# Patient Record
Sex: Female | Born: 1995 | Race: Black or African American | Hispanic: No | Marital: Single | Smoking: Never smoker
Health system: Southern US, Community
[De-identification: ages and names within clinical notes are randomized; demographics above are authoritative.]

## PROBLEM LIST (undated history)

## (undated) DIAGNOSIS — D573 Sickle-cell trait: Secondary | ICD-10-CM

## (undated) DIAGNOSIS — S3141XA Laceration without foreign body of vagina and vulva, initial encounter: Secondary | ICD-10-CM

## (undated) HISTORY — DX: Sickle-cell trait: D57.3

## (undated) HISTORY — DX: Laceration without foreign body of vagina and vulva, initial encounter: S31.41XA

## (undated) HISTORY — PX: OTHER SURGICAL HISTORY: SHX169

---
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
## 2014-01-27 ENCOUNTER — Encounter: Payer: Self-pay | Admitting: Pediatrics

## 2014-01-27 ENCOUNTER — Ambulatory Visit (INDEPENDENT_AMBULATORY_CARE_PROVIDER_SITE_OTHER): Payer: Medicaid Other | Admitting: Pediatrics

## 2014-01-27 VITALS — BP 100/74 | Ht 64.0 in | Wt 185.6 lb

## 2014-01-27 DIAGNOSIS — L708 Other acne: Secondary | ICD-10-CM

## 2014-01-27 DIAGNOSIS — N925 Other specified irregular menstruation: Secondary | ICD-10-CM

## 2014-01-27 DIAGNOSIS — N938 Other specified abnormal uterine and vaginal bleeding: Secondary | ICD-10-CM

## 2014-01-27 DIAGNOSIS — Z113 Encounter for screening for infections with a predominantly sexual mode of transmission: Secondary | ICD-10-CM

## 2014-01-27 DIAGNOSIS — L709 Acne, unspecified: Secondary | ICD-10-CM

## 2014-01-27 DIAGNOSIS — N949 Unspecified condition associated with female genital organs and menstrual cycle: Secondary | ICD-10-CM

## 2014-01-27 LAB — POCT HEMOGLOBIN: Hemoglobin: 14.5 g/dL (ref 12.2–16.2)

## 2014-01-27 NOTE — Progress Notes (Signed)
ROS per resident note  I saw and evaluated the patient, performing the key elements of the service.  I developed the management plan that is described in the resident's note, and I agree with the content.

## 2014-01-27 NOTE — Progress Notes (Addendum)
Adolescent Medicine Follow-Up Visit Christy Barnes  is a 18 y.o. female here today for follow-up of irregular vaginal bleeding.   PCP Confirmed?  yes  PERRY, Christy December, MD   History was provided by the patient.  Chart review:  Last seen by Dr. Henrene Barnes on 11/04/2013.  Treatment plan at last visit was continuing Nexplanon and routine screening.    Patient's last menstrual period was 01/20/2014.  Last STI screen: 11/04/2013. Other Labs:11/04/2013 Hgb 15.0, BV and Candida positive, treated with Metroniazole and Fluconazole.   Immunizations: up-to-date   HPI:  Pt reports multiple issues:    1. Irregular vaginal bleeding: Starting October 2014 until January 2015 with medium flow (1-2 pads/tampons a day) tapering to lighter flow at the end. Now since January 2015 having intermittent  bleeding with 3-7 days between episodes.  With the bleeding is having a light flow (1 pad/tampson/day) with menstrual cramps at the beginnning.   Nexplanon placed in June 2014. No history of abnormal bleeding prior to Nexplanon placement.  Denies any dizziness or syncope. Understands that this could be related to the Nexplanon and wants to continue with Nexplanon.  2. STI counseling: Recently started having sexual intercourse with new partner.  Condom use with each encounter according to [facility].  No known STIs. Denies vaginal itching, bleeding, or lesions.  Wants to be safe and is interested in STI testing with new partner.   3. 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 Has been waking her up at nighttime. No medications. Also with sore throat.  History of tactile fever yesterday and rhinorrhea last week, have since resolved. 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   Denies myalgias, wheezing, diarrhea, or vomiting.    ROS as above.   Problem List Reviewed: yes  Medication List Reviewed:  Yes   Sleep: no issues  Nutrition: asking today what vitamins she  should be taking. Reports she generally tries to eat a balanced diet at school but doesn't eat a lot of cow's milk or other dairy products.  School: [employer] at [employer].  Getting  A/B/Cs.    Social History: Confidentiality was discussed with the patient and if applicable, with caregiver as well. Tobacco? no Secondhand smoke exposure? no Drugs/EtOH? no Sexually active? Yes, new partner at school  Safe at home, in school & in relationships? Yes  Last STI Screening: 11/04/13 Pregnancy Prevention: Nexplanon   Physical Exam:  Filed Vitals:   01/27/14 0829  BP: 100/74  Height: 5\' 4"  (1.626 m)  Weight: 185 lb 9.6 oz (84.188 kg)   BP 100/74  Ht 5\' 4"  (1.626 m)  Wt 185 lb 9.6 oz (84.188 kg)  BMI 31.84 kg/m2  LMP 01/20/2014 Body mass index: body mass index is 31.84 kg/(m^2). 80.9% systolic and 98.3% diastolic of BP percentile by age, sex, and height. 128/83 is approximately the 95th BP percentile reading.  GEN: Well appearing, well nourished adolescent female, interactive, in no acute distress. Occasional cough.  HEENT: Normocephalic, atraumatic. PERRLA. Sclera clear. TMs bilaterally clear without fluid or erythema. Oropharynx clear with mild erythema to posterior pharynx, no exudate seen. Moist mucous membranes.   PULM:  Unlabored respirations.  Clear to auscultation bilaterally with no wheezes or crackles.  No accessory muscle use. CARDIO:  Regular rate and rhythm.  No murmurs.  2+ radial pulses GI:  Soft, non tender, non distended.  Normoactive bowel sounds.  No masses.  No hepatosplenomegaly.  EXT: Warm, well perfused.  NEURO: Alert and oriented. Mood and affect normal. No focal deficits.    Results for orders placed in visit on 01/27/14 (from the past 48 hour(s))  POCT HEMOGLOBIN     Status: None   Collection Time    01/27/14  9:10 AM      Result Value Ref Range   Hemoglobin 14.5  12.2 - 16.2 g/dL      Assessment/Plan: Christy Barnes is a 18 year old female presenting with  several medical issues today including vaginal bleeding, cough, STI testings, and nutritional concerns.  Her irregular uterine bleeding is most likely related to recent Nexplanon placement. She has remained asymptomatic with her ongoing blood loss and her hemoglobin is stable.  Other etiologies for irregular uterine bleeding including STIs, which we were planning to obtain given new sexual partner.  Reassured Christy Barnes and discussed possible removal of Nexplanon, however she would prefer to keep in and will follow up if it continues to be problematic. She also presents with a cough and sore throat that is likely viral given well appearance and no localizing findings on exam. No need for rapid Strep test given benign appearance of oropharynx or influenza with lack of fevers or myalgias and unlikely to treat given length of symptoms.           1. Irregular uterine bleeding:  - Continue Nexplanon.  Counseled on reasons to return to clinic.  - Stable hemoglobin with check today.   2. STI counseling: - Urine GC/Chlamydia  - HIV test   3.  URI:  - Supportive care: nasal saline, teaspoon of honey, and gargle salt water.  - Return precautions, and emergency procedures reviewed.   4. Nutrition:  - Recommended OTC Ca/Vit D supplement daily  - No need for multivitamin given balance diet.   Follow up in Barnes 2015 for physical.   Medical decision-making:  - 40 minutes spent, more than 50% of appointment was spent discussing diagnosis and management of symptoms

## 2014-01-27 NOTE — Patient Instructions (Signed)
For your cough and sore throat: - try gargling with salt water,  - using saline spray to help with congestion in nose - 1 teaspoon of honey for the cough  Your bleeding is most likely due to the Nexplanon but we checked your hemoglobin and it was 14.5 which is in the normal range.   You can start taking a Viactiv generic Vit D and Calcium chew since you are not drinking a lot of milk or other dairy products.    Follow up in December 2015, please call for an appointment.

## 2014-01-28 LAB — GC/CHLAMYDIA PROBE AMP, URINE
Chlamydia, Swab/Urine, PCR: NEGATIVE
GC Probe Amp, Urine: NEGATIVE

## 2014-01-28 LAB — HIV ANTIBODY (ROUTINE TESTING W REFLEX): HIV: NONREACTIVE

## 2014-01-30 ENCOUNTER — Telehealth: Payer: Self-pay

## 2014-01-30 NOTE — Telephone Encounter (Signed)
Called and advised patient that her recent labs were all normal.

## 2014-02-25 NOTE — Progress Notes (Signed)
I saw and evaluated the patient, performing the key elements of the service.  I developed the management plan that is described in the resident's note, and I agree with the content. 

## 2014-04-08 ENCOUNTER — Ambulatory Visit (INDEPENDENT_AMBULATORY_CARE_PROVIDER_SITE_OTHER): Payer: Medicaid Other | Admitting: Pediatrics

## 2014-04-08 VITALS — Temp 97.3°F | Wt 185.0 lb

## 2014-04-08 DIAGNOSIS — J309 Allergic rhinitis, unspecified: Secondary | ICD-10-CM

## 2014-04-08 DIAGNOSIS — Z113 Encounter for screening for infections with a predominantly sexual mode of transmission: Secondary | ICD-10-CM

## 2014-04-08 LAB — POCT URINE PREGNANCY: Preg Test, Ur: NEGATIVE

## 2014-04-08 MED ORDER — FLUTICASONE PROPIONATE 50 MCG/ACT NA SUSP: 1.0000 | Freq: Every day | NASAL | Status: DC

## 2014-04-08 MED ORDER — CETIRIZINE HCL 10 MG PO TABS: 10.0000 mg | ORAL_TABLET | Freq: Every day | ORAL | Status: DC

## 2014-04-09 LAB — GC/CHLAMYDIA PROBE AMP, URINE
Chlamydia, Swab/Urine, PCR: NEGATIVE
GC Probe Amp, Urine: NEGATIVE

## 2014-04-09 LAB — HIV ANTIBODY (ROUTINE TESTING W REFLEX): HIV 1&2 Ab, 4th Generation: NONREACTIVE

## 2014-04-09 NOTE — Progress Notes (Signed)
Quick Note:  Leeloo notified by phone of results. ______

## 2014-04-09 NOTE — Progress Notes (Signed)
  Subjective:    Christy Barnes is a 18 y.o. old female here for Cough, Nasal Congestion, Generalized Body Aches and STI screening   HPI  Nasal congestion, post-nasal drip and slight dry cough for 7-10 days after returning home from college. Having some headaches.  No fever, eating and drinking well.   Has a h/o seasonal allergies and is off all allergy medications right now.  Would also like STI screening - has Nexplanon.  Single partner and uses a condom every time but would like STI screening "just to be sure." No symptoms of vaginal discharge or pain.  No dysuria.  Review of Systems  Constitutional: Negative for fever, activity change and appetite change.  HENT: Negative for sinus pressure and sore throat.   Respiratory: Negative for chest tightness and wheezing.   Genitourinary: Negative for dysuria, frequency, vaginal discharge and vaginal pain.    Immunizations needed: none     Objective:    Temp(Src) 97.3 F (36.3 C) (Temporal)  Wt 185 lb (83.915 kg)  LMP 04/04/2014 Physical Exam  Constitutional: She appears well-developed.  HENT:  Right Ear: External ear normal.  Left Ear: External ear normal.  Cobblestoning of poster OP, boggy nasal mucosa  Cardiovascular: Normal rate and regular rhythm.   No murmur heard. Pulmonary/Chest: Breath sounds normal. She has no wheezes.       Assessment and Plan:     Christy Barnes was seen today for Cough, Nasal Congestion, Generalized Body Aches and STI screening  Allergic rhinitis - will rx cetirizine and flonase.  STI screening - see lab orders.  Counseled regarding infection prevention.  Use a condom every time.   Problem List Items Addressed This Visit   None    Visit Diagnoses   Allergic rhinitis    -  Primary    Screening examination for venereal disease        Relevant Orders       GC/chlamydia probe amp, urine (Completed)       POCT urine pregnancy (Completed)       HIV antibody (Completed)       Follow up as needed or if  no improvement in one week.  Royston Cowper, MD

## 2014-05-05 ENCOUNTER — Ambulatory Visit (INDEPENDENT_AMBULATORY_CARE_PROVIDER_SITE_OTHER): Payer: Medicaid Other | Admitting: Pediatrics

## 2014-05-05 ENCOUNTER — Encounter: Payer: Self-pay | Admitting: Pediatrics

## 2014-05-05 VITALS — BP 104/68 | Wt 184.4 lb

## 2014-05-05 DIAGNOSIS — R5381 Other malaise: Secondary | ICD-10-CM

## 2014-05-05 DIAGNOSIS — R1013 Epigastric pain: Secondary | ICD-10-CM

## 2014-05-05 DIAGNOSIS — R5383 Other fatigue: Secondary | ICD-10-CM

## 2014-05-05 LAB — POCT URINALYSIS DIPSTICK
Glucose, UA: NORMAL
Ketones, UA: NEGATIVE
Nitrite, UA: NEGATIVE
Protein, UA: NEGATIVE
Spec Grav, UA: 1.015
Urobilinogen, UA: 4
pH, UA: 7

## 2014-05-05 LAB — CBC WITH DIFFERENTIAL/PLATELET
Basophils Absolute: 0 10*3/uL (ref 0.0–0.1)
Basophils Relative: 1 % (ref 0–1)
Eosinophils Absolute: 0 10*3/uL (ref 0.0–0.7)
Eosinophils Relative: 1 % (ref 0–5)
HCT: 41.2 % (ref 36.0–46.0)
Hemoglobin: 14.2 g/dL (ref 12.0–15.0)
Lymphocytes Relative: 45 % (ref 12–46)
Lymphs Abs: 2 10*3/uL (ref 0.7–4.0)
MCH: 31 pg (ref 26.0–34.0)
MCHC: 34.5 g/dL (ref 30.0–36.0)
MCV: 90 fL (ref 78.0–100.0)
Monocytes Absolute: 0.3 10*3/uL (ref 0.1–1.0)
Monocytes Relative: 6 % (ref 3–12)
Neutro Abs: 2.1 10*3/uL (ref 1.7–7.7)
Neutrophils Relative %: 47 % (ref 43–77)
Platelets: 263 10*3/uL (ref 150–400)
RBC: 4.58 MIL/uL (ref 3.87–5.11)
RDW: 12.6 % (ref 11.5–15.5)
WBC: 4.4 10*3/uL (ref 4.0–10.5)

## 2014-05-05 LAB — POCT HEMOGLOBIN: Hemoglobin: 14.9 g/dL (ref 12.2–16.2)

## 2014-05-05 MED ORDER — OMEPRAZOLE 20 MG PO CPDR: 20.0000 mg | DELAYED_RELEASE_CAPSULE | Freq: Every day | ORAL | Status: DC

## 2014-05-05 NOTE — Progress Notes (Signed)
History was provided by the patient.  HPI:   Christy Barnes is a 18 y.o. female who is here for abdominal pain.  She reports she has never had a pain like this before.  She reports pain started 5 days ago and is achy and feels like a bruise.  She reports pain is epigastric, constant and never goes away.  She says pain ranges from 7-10/10.  She reports she feels dizzy with the pain.  No nausea, vomiting or diarrhea. No fevers.  No changes in appetite. No history of constipation. She denies any alcohol consumption.  No history of frequent NSAID use.  She has a nexplanon in place and last had a period yesterday.  She reports last sexual contact was April.  She denies vaginal discharge or discomfort or pelvic pain.    Pt also notes that she seems very tired lately.  She reports sleeping between 5-8 hours nightly with nighttime awakenings. She does snore at night.  Physical Exam:  BP 104/68  Wt 184 lb 6.4 oz (83.643 kg)  LMP 05/03/2014  No height on file for this encounter. Patient's last menstrual period was 05/03/2014.    General:   alert and no distress     Skin:   normal  Oral cavity:   lips, mucosa, and tongue normal; teeth and gums normal  Eyes:   sclerae white, pupils equal and reactive  Ears:   normal bilaterally  Nose: clear, no discharge  Neck:  Neck appearance: Normal  Lungs:  clear to auscultation bilaterally  Heart:   regular rate and rhythm, S1, S2 normal, no murmur, click, rub or gallop   Abdomen:  + bs, point tenderness on small area in epigastric region, no guarding or rebound, no peritoneal signs  GU:  not examined  Extremities:   extremities normal, atraumatic, no cyanosis or edema  Neuro:  normal without focal findings and mental status, speech normal, alert and oriented x3   POC Hgb 14.9 UA: small LE U preg: neg  Assessment/Plan:  18 yo previously healthy female presents with 5 days of mid epigastric abdominal pain. Non toxic appearing. Eating and drinking  normally. Abdominal exam without peritoneal signs, negative murphy's sign, not indicative of acute abdomen.  Location of pain may be consistent with PUD or reflux.  Unlikely gallbladder or hepatic in nature given clinical presentation but will obtain labs to r/o gallbladder/liver disease.   1. Abd pain - U preg neg - obtain CBC/CMP/Amylase/Lipase and H. Pylori  - will send urine for GC/Chl - start omeprazole 20 mg daily, may continue TUMS  2. Fatigue: - POC Hgb wnl, given history is likely not getting enough sleep - discussed possibility of ENT referral to r/o sleep apnea and pt would like to discuss at next visit.  - Immunizations today: none  - Follow-up visit in 2 days for abdominal pain, or sooner as needed.   Pt given RTC precautions and instructed to go to ER if pain becomes worse or she develops fever.  Christy Barnes. MD PGY-2 [facility] 05/05/2014 5:05 PM     Christy Heck, MD  05/05/2014

## 2014-05-05 NOTE — Patient Instructions (Signed)
Take omeprazole (20 mg) once daily.

## 2014-05-05 NOTE — Progress Notes (Signed)
Patient states she has been having abdominal pain in epigastric area since last Friday. She states that she has been lightheaded on/off since Friday and today it has lasted all day and with pressure.

## 2014-05-05 NOTE — Progress Notes (Signed)
I saw and evaluated the patient.  I participated in the key portions of the service.  I reviewed the resident's note.  I discussed and agree with the resident's findings and plan.    Tavio Biegel, MD   [facility] [address] [place], [place] [address] [phone] 

## 2014-05-06 LAB — COMPREHENSIVE METABOLIC PANEL
ALT: 10 U/L (ref 0–35)
AST: 16 U/L (ref 0–37)
Albumin: 4.3 g/dL (ref 3.5–5.2)
Alkaline Phosphatase: 72 U/L (ref 39–117)
BUN: 8 mg/dL (ref 6–23)
CO2: 25 mEq/L (ref 19–32)
Calcium: 9.6 mg/dL (ref 8.4–10.5)
Chloride: 105 mEq/L (ref 96–112)
Creat: 0.95 mg/dL (ref 0.50–1.10)
Glucose, Bld: 57 mg/dL — ABNORMAL LOW (ref 70–99)
Potassium: 4.3 mEq/L (ref 3.5–5.3)
Sodium: 140 mEq/L (ref 135–145)
Total Bilirubin: 1.3 mg/dL — ABNORMAL HIGH (ref 0.2–1.1)
Total Protein: 7 g/dL (ref 6.0–8.3)

## 2014-05-06 LAB — AMYLASE: Amylase: 47 U/L (ref 0–105)

## 2014-05-06 LAB — GC/CHLAMYDIA PROBE AMP, URINE
Chlamydia, Swab/Urine, PCR: NEGATIVE
GC Probe Amp, Urine: NEGATIVE

## 2014-05-06 LAB — H. PYLORI ANTIBODY, IGG: H Pylori IgG: 3.22 {ISR} — ABNORMAL HIGH

## 2014-05-06 LAB — LIPASE: Lipase: 28 U/L (ref 0–75)

## 2014-05-07 ENCOUNTER — Ambulatory Visit (INDEPENDENT_AMBULATORY_CARE_PROVIDER_SITE_OTHER): Payer: Medicaid Other | Admitting: Pediatrics

## 2014-05-07 ENCOUNTER — Encounter: Payer: Self-pay | Admitting: Pediatrics

## 2014-05-07 VITALS — BP 104/64 | Wt 185.6 lb

## 2014-05-07 DIAGNOSIS — R1013 Epigastric pain: Secondary | ICD-10-CM

## 2014-05-07 DIAGNOSIS — J309 Allergic rhinitis, unspecified: Secondary | ICD-10-CM

## 2014-05-07 MED ORDER — FLUTICASONE PROPIONATE 50 MCG/ACT NA SUSP: 1.0000 | Freq: Every day | NASAL | Status: DC

## 2014-05-07 MED ORDER — CETIRIZINE HCL 10 MG PO TABS: 10.0000 mg | ORAL_TABLET | Freq: Every day | ORAL | Status: DC

## 2014-05-07 NOTE — Patient Instructions (Signed)
Continue taking the omeprazole.  We will send a stool H pylori antigen to see if you have an active H pylori infection.  I will call you with results and if you have it, we will treat you with antibiotics.    Allergic Rhinitis Allergic rhinitis is when the mucous membranes in the nose respond to allergens. Allergens are particles in the air that cause your body to have an allergic reaction. This causes you to release allergic antibodies. Through a chain of events, these eventually cause you to release histamine into the blood stream. Although meant to protect the body, it is this release of histamine that causes your discomfort, such as frequent sneezing, congestion, and an itchy, runny nose.  CAUSES  Seasonal allergic rhinitis (hay fever) is caused by pollen allergens that may come from grasses, trees, and weeds. Year-round allergic rhinitis (perennial allergic rhinitis) is caused by allergens such as house dust mites, pet dander, and mold spores.  SYMPTOMS   Nasal stuffiness (congestion).  Itchy, runny nose with sneezing and tearing of the eyes. DIAGNOSIS  Your health care provider can help you determine the allergen or allergens that trigger your symptoms. If you and your health care provider are unable to determine the allergen, skin or blood testing may be used. TREATMENT  Allergic rhinitis does not have a cure, but it can be controlled by:  Medicines and allergy shots (immunotherapy).  Avoiding the allergen. Hay fever may often be treated with antihistamines in pill or nasal spray forms. Antihistamines block the effects of histamine. There are over-the-counter medicines that may help with nasal congestion and swelling around the eyes. Check with your health care provider before taking or giving this medicine.  If avoiding the allergen or the medicine prescribed do not work, there are many new medicines your health care provider can prescribe. Stronger medicine may be used if initial  measures are ineffective. Desensitizing injections can be used if medicine and avoidance does not work. Desensitization is when a patient is given ongoing shots until the body becomes less sensitive to the allergen. Make sure you follow up with your health care provider if problems continue. HOME CARE INSTRUCTIONS It is not possible to completely avoid allergens, but you can reduce your symptoms by taking steps to limit your exposure to them. It helps to know exactly what you are allergic to so that you can avoid your specific triggers. SEEK MEDICAL CARE IF:   You have a fever.  You develop a cough that does not stop easily (persistent).  You have shortness of breath.  You start wheezing.  Symptoms interfere with normal daily activities. Document Released: 08/01/2001 Document Revised: 11/11/2013 Document Reviewed: 07/14/2013 [facility] Patient Information 2015 [place], [place]. This information is not intended to replace advice given to you by your health care provider. Make sure you discuss any questions you have with your health care provider.

## 2014-05-07 NOTE — Progress Notes (Signed)
  Subjective:    Christy Barnes is a 18 y.o. old female here for Follow-up of abdominal pain. .   Mother present for later part of visit.  HPI    Taking omeprazole and feels that it is helping the pain - pain was 10 now is about 5-6.  Pain is better with eating.  Feels that the pain is worse if she stretches certain ways or with certain sleep positions.   No change in stooling - no dark, tarry stools or constipation.  No problem with menses - no significant cramping or heavy bleeding, although did have some more cramping with this most recent cycle.    Lab results reviewed from 6/16 and discussed with Meleana - H pylori IgG was positive, remainder of labs essentially normal with exception of mildly elevated total bilirubin (1.3).  Still with some daytime fatigue, but sleeps between 6 and 8 hours per night.  Did not realize that she should be sleeping a little more.  She does exercise regularly but hasn't been for the past couple of weeks.  She generally feels more energized when she exercises. Does have mild snoring and some chronic nasal congestion - has previously been rx'ed AR medications but never started them.  Of note, mother is from [place] and [place] has traveled there, but never for more than a few weeks at a time.  Most recent visit was approximately 8 years ago.     Review of Systems  Constitutional: Negative for fever.  Respiratory: Negative for cough and wheezing.   Cardiovascular: Negative for chest pain.  Gastrointestinal: Negative for nausea, vomiting and diarrhea.  Genitourinary: Negative for dysuria.    Immunizations needed: none     Objective:    BP 104/64  Wt 185 lb 10.1 oz (84.201 kg)  LMP 05/03/2014 Physical Exam  Constitutional: She appears well-developed and well-nourished.  HENT:  Mouth/Throat: No oropharyngeal exudate.  Boggy nasal turbinates, mild cobblestoning of posterior OP  Cardiovascular: Normal rate and regular rhythm.   No murmur  heard. Pulmonary/Chest: Effort normal and breath sounds normal.  Abdominal: Soft. Bowel sounds are normal. She exhibits no distension and no mass. There is no tenderness. There is no rebound.  No tenderness to palpation, negative Murphy's sign  Skin: No rash noted.       Assessment and Plan:     Morenike was seen today for Follow-up  Abdominal pain - improved with PPI.  IgG positive for H pylori, but in my review of guidelines, the IgG seems to be fairly sensitive but not very specific and possibly represents past exposure rather than current infection.  Will send stool H pylori antigen to further clarify and treat if stool antigen is positive.  In the meantime healthy diet reviewed and to continue PPI.  Fatigue - discussed healthy sleep habits.  Also encouraged Keilany to exercise as that seems to help her.  Allergic rhinitis - flonase and cetrizine rx sent.   Will phone follow up when we receive stool results. Return precuations reviewed.    Royston Cowper, MD

## 2014-05-08 DIAGNOSIS — J309 Allergic rhinitis, unspecified: Secondary | ICD-10-CM | POA: Insufficient documentation

## 2014-06-29 ENCOUNTER — Ambulatory Visit (INDEPENDENT_AMBULATORY_CARE_PROVIDER_SITE_OTHER): Payer: Medicaid Other | Admitting: Pediatrics

## 2014-06-29 ENCOUNTER — Encounter: Payer: Self-pay | Admitting: Pediatrics

## 2014-06-29 ENCOUNTER — Telehealth: Payer: Self-pay

## 2014-06-29 VITALS — BP 100/70 | Temp 97.0°F | Wt 181.6 lb

## 2014-06-29 DIAGNOSIS — J309 Allergic rhinitis, unspecified: Secondary | ICD-10-CM

## 2014-06-29 DIAGNOSIS — J3089 Other allergic rhinitis: Secondary | ICD-10-CM

## 2014-06-29 DIAGNOSIS — N760 Acute vaginitis: Secondary | ICD-10-CM

## 2014-06-29 DIAGNOSIS — K219 Gastro-esophageal reflux disease without esophagitis: Secondary | ICD-10-CM | POA: Insufficient documentation

## 2014-06-29 DIAGNOSIS — N762 Acute vulvitis: Secondary | ICD-10-CM | POA: Insufficient documentation

## 2014-06-29 MED ORDER — FLUCONAZOLE 100 MG PO TABS: 150.0000 mg | ORAL_TABLET | Freq: Once | ORAL | Status: DC

## 2014-06-29 MED ORDER — OMEPRAZOLE 20 MG PO CPDR: 20.0000 mg | DELAYED_RELEASE_CAPSULE | Freq: Every day | ORAL | Status: DC

## 2014-06-29 NOTE — Progress Notes (Signed)
Vaginal itching x 2-3 weeks, gas cramps have returned since last time that patient was here. Patient is going back to school tomorrow and wanted to be seen and treated before going back. Patient states she needs refills on allergy medications and omeprazole.

## 2014-06-29 NOTE — Progress Notes (Signed)
Subjective:     Patient ID: Christy Barnes, female   DOB: December 04, 1995, 18 y.o.   MRN: [id number]  HPI:  18 year old female who is leaving for college tomorrow and wants refill of some of her meds.  Her allergy meds (Cetirizine and Fluticasone) have refills for a year.  She is out of Omeprazole which she takes for GER and "gassy cramps".    For past several days she has had external vaginal itching without odor or discharge.  LMP was last week.  She has an Implanon and has not changed sex partners.  Last screening for GC/Chlamydia was at her 05/05/14 visit.  She takes showers with Dove soap and uses unscented sanitary pads and sometimes tampons.  Her pubic hair is shaved.   Review of Systems  Constitutional: Negative for fever, activity change and appetite change.  HENT: Negative.   Respiratory: Negative.   Gastrointestinal: Negative.   Genitourinary: Negative for dysuria, frequency, vaginal discharge, difficulty urinating, genital sores and pelvic pain.       Objective:   Physical Exam  Nursing note and vitals reviewed. Constitutional: She appears well-developed and well-nourished.  HENT:  Nose: Nose normal.  Mouth/Throat: Oropharynx is clear and moist.  Neck: Neck supple.  Cardiovascular: Normal rate and normal heart sounds.   No murmur heard. Pulmonary/Chest: Effort normal and breath sounds normal.  Abdominal: Soft. She exhibits no distension. There is no tenderness.  Genitourinary: Vagina normal. No vaginal discharge found.  No redness or swelling of vulvar area.  No external lesions  Lymphadenopathy:    She has no cervical adenopathy.       Assessment:     Vulvitis without discharge- ? yeast AR- under control Hx of GER- needs refil     Plan:     Wet prep obtained.  Pt cell:  [phone]  Rx per orders.   Report worsening sx.   Ander Slade, PPCNP-BC

## 2014-06-29 NOTE — Telephone Encounter (Addendum)
Christy Barnes left VM on refill line asking for her nasal spray and allergy pills (flonase and zyrtec per chart) She uses [facility] on [address]. Checked chart and should have lots of refills from Rx written 6/15. Called pharmacy at [phone] and they do not have any refills listed either. They suggested we re-send electronically. Dr Owens Shark saw patient in June. Will forward notes to PCP also. [place] knows not to try to pick up meds before tomorrow am.

## 2014-06-30 LAB — WET PREP, GENITAL
Trich, Wet Prep: NONE SEEN
Yeast Wet Prep HPF POC: NONE SEEN

## 2014-06-30 MED ORDER — FLUTICASONE PROPIONATE 50 MCG/ACT NA SUSP: 1.0000 | Freq: Every day | NASAL | Status: DC

## 2014-06-30 MED ORDER — CETIRIZINE HCL 10 MG PO TABS: 10.0000 mg | ORAL_TABLET | Freq: Every day | ORAL | Status: DC

## 2014-06-30 NOTE — Telephone Encounter (Signed)
Prescription sent, please notify patient

## 2014-06-30 NOTE — Addendum Note (Signed)
Addended by: Lenore Cordia F on: 06/30/2014 08:39 AM   Modules accepted: Orders

## 2014-07-02 NOTE — Progress Notes (Signed)
Quick Note:  Discussed results with Dr. Henrene Pastor. Patient was seen 06/29/14 with symptom of external vulvar itching in the absence of discharge, odor, lower quadrant pain or dysuria. No treatment required for presence of clue cells without symptoms.   Ander Slade, PPCNP-BC  ______

## 2014-07-14 ENCOUNTER — Telehealth: Payer: Self-pay

## 2014-07-14 DIAGNOSIS — J3089 Other allergic rhinitis: Secondary | ICD-10-CM

## 2014-07-14 NOTE — Telephone Encounter (Signed)
Christy Barnes called stating she needs refills on her Flonase and Cetirizine 10 mg.  She is in college and doing well.  She is not scheduled with [place] currently but did see [place] in 06/2014.

## 2014-07-15 MED ORDER — FLUTICASONE PROPIONATE 50 MCG/ACT NA SUSP: 1.0000 | Freq: Every day | NASAL | Status: DC

## 2014-07-15 MED ORDER — CETIRIZINE HCL 10 MG PO TABS: 10.0000 mg | ORAL_TABLET | Freq: Every day | ORAL | Status: DC

## 2014-07-15 NOTE — Addendum Note (Signed)
Addended by: Lenore Cordia F on: 07/15/2014 11:27 PM   Modules accepted: Orders

## 2014-07-15 NOTE — Telephone Encounter (Signed)
Medications sent to [facility] listed in record for patient.  Please notify her that prescriptions have been sent.

## 2014-07-16 NOTE — Telephone Encounter (Signed)
Called and advised pt her rxs were sent to [facility].  She verbalized understanding.

## 2014-08-05 ENCOUNTER — Telehealth: Payer: Self-pay

## 2014-08-05 NOTE — Telephone Encounter (Signed)
Mom called stating the patient is at college in [place] and is sick.  She is asking that you send medicine to the pharmacy for her.  I advised her she needs to be seen at student health or somewhere there and she did not receive what I told her.

## 2014-10-23 ENCOUNTER — Other Ambulatory Visit: Payer: Self-pay | Admitting: Pediatrics

## 2014-10-23 DIAGNOSIS — L700 Acne vulgaris: Secondary | ICD-10-CM

## 2014-10-23 MED ORDER — CLINDAMYCIN PHOS-BENZOYL PEROX 1-5 % EX GEL: Freq: Two times a day (BID) | CUTANEOUS | Status: DC

## 2014-10-23 NOTE — Telephone Encounter (Signed)
[facility] instead of brand name benzaclin.  Rx sent.

## 2014-10-29 ENCOUNTER — Ambulatory Visit (INDEPENDENT_AMBULATORY_CARE_PROVIDER_SITE_OTHER): Payer: Medicaid Other | Admitting: Pediatrics

## 2014-10-29 ENCOUNTER — Encounter: Payer: Self-pay | Admitting: Pediatrics

## 2014-10-29 DIAGNOSIS — M79644 Pain in right finger(s): Secondary | ICD-10-CM

## 2014-10-29 DIAGNOSIS — L700 Acne vulgaris: Secondary | ICD-10-CM

## 2014-10-29 DIAGNOSIS — Z113 Encounter for screening for infections with a predominantly sexual mode of transmission: Secondary | ICD-10-CM

## 2014-10-29 MED ORDER — CLINDAMYCIN PHOS-BENZOYL PEROX 1-5 % EX GEL: Freq: Two times a day (BID) | CUTANEOUS | Status: DC

## 2014-10-29 NOTE — Progress Notes (Signed)
I reviewed the resident's note and agree with the findings and plan. Maybel Dambrosio, PPCNP-BC  

## 2014-10-29 NOTE — Patient Instructions (Signed)
Continue using hte pain medications that you were prescribed. Also continue icing the thumb at least a few times a day.  There is a splint called a THUMB SPICA SPLIT which is like a wrist guard with a section covering the thumb. You can try to use this to see if it helps, but you should not use it all day.  We will repeat the x-rays today.

## 2014-10-29 NOTE — Progress Notes (Signed)
Subjective:     Patient ID: Christy Barnes, female   DOB: 08-Aug-1996, 18 y.o.   MRN: [id number]  HPI  CC: right thumb pain  # Right thumb pain  1 month ago shut hand in car door, hit thumb, first 3 fingers. Went to health clinic, had x-rays, given diclofenac and hydrocodone-apap. Also icing area. Pain in thumb has persisted.  Medications have not helped much with the pain  Says x-ray did not show fracture of the thumb but said that there was "something" near her hand that should be re-evaluated  Able to move the thumb, occasionally feels swollen and some numbness.   # STD screening  Requests to be tested today  # Acne vulgaris  Says that her refills of benzaclin was denied because it is "no longer covered" by medicaid   Review of Systems No fevers/chills, no nausea/vomiting.   PMH/PSH/SH/FH reviewed and updated as appropriate.    Objective:   Physical Exam LMP 10/15/2014  General: NAD, pleasant 18yo female CV: 2+ radial pulses bilaterally Ext: right thumb not swollen, tender around DP joint, nail bed, and base of thumb; no deformity noted. Thumbnail with black/dried blood under base of nail, about 50% of nail. ROM intact, no difficulty with abduction/adduction, flexion/extension.  Neuro: alert/oriented. grip strength intact, sensation grossly intact to right thumb. Skin: no erythema or swelling of thumb     Assessment:     18 y.o. female with      Plan:     1. Thumb pain, right Discussed time course and pain persisting for >8 weeks. Thumbnail likely to fall off at some point. Continue pain meds as previously prescribed, icing. Also given information on thumb spica splint but told her this likely won't change anything, and primarily to use it at night if she does buy one. Return precautions for signs of infection, frozen joint, neurovascular compromise. Will repeat x-ray to eval for fracture and scaphoid bone (at risk for avascular necrosis/poor healing if affected,  with continued tenderness there) - DG Hand Complete Right; Future  2. Acne vulgaris [facility] as reviewed medicaid 2015 formulary and benzaclin is on this. - clindamycin-benzoyl peroxide (BENZACLIN) gel; Apply topically 2 (two) times daily.  Dispense: 50 g; Refill: 5  3. Screening for STD (sexually transmitted disease) Pt requests STD testing - GC/chlamydia probe amp, urine - HIV antibody (with reflex) - RPR

## 2014-10-29 NOTE — Progress Notes (Signed)
Per pt smashed thumb in door, numbness and painful to move, questions about implanon and testing

## 2014-10-30 LAB — GC/CHLAMYDIA PROBE AMP, URINE
Chlamydia, Swab/Urine, PCR: NEGATIVE
GC Probe Amp, Urine: NEGATIVE

## 2014-10-30 LAB — RPR

## 2014-10-30 LAB — HIV ANTIBODY (ROUTINE TESTING W REFLEX): HIV 1&2 Ab, 4th Generation: NONREACTIVE

## 2015-02-12 ENCOUNTER — Encounter (INDEPENDENT_AMBULATORY_CARE_PROVIDER_SITE_OTHER): Payer: Self-pay

## 2015-02-12 ENCOUNTER — Ambulatory Visit (INDEPENDENT_AMBULATORY_CARE_PROVIDER_SITE_OTHER): Payer: Medicaid Other | Admitting: Pediatrics

## 2015-02-12 VITALS — Temp 98.3°F | Wt 174.8 lb

## 2015-02-12 DIAGNOSIS — Z113 Encounter for screening for infections with a predominantly sexual mode of transmission: Secondary | ICD-10-CM

## 2015-02-12 DIAGNOSIS — K219 Gastro-esophageal reflux disease without esophagitis: Secondary | ICD-10-CM | POA: Diagnosis not present

## 2015-02-12 DIAGNOSIS — J029 Acute pharyngitis, unspecified: Secondary | ICD-10-CM | POA: Diagnosis not present

## 2015-02-12 DIAGNOSIS — Z23 Encounter for immunization: Secondary | ICD-10-CM

## 2015-02-12 DIAGNOSIS — Z3202 Encounter for pregnancy test, result negative: Secondary | ICD-10-CM | POA: Diagnosis not present

## 2015-02-12 DIAGNOSIS — L309 Dermatitis, unspecified: Secondary | ICD-10-CM | POA: Diagnosis not present

## 2015-02-12 LAB — POCT URINE PREGNANCY: Preg Test, Ur: NEGATIVE

## 2015-02-12 MED ORDER — TRIAMCINOLONE ACETONIDE 0.1 % EX OINT: 1.0000 "application " | TOPICAL_OINTMENT | Freq: Two times a day (BID) | CUTANEOUS | Status: DC

## 2015-02-12 MED ORDER — OMEPRAZOLE 20 MG PO CPDR: 20.0000 mg | DELAYED_RELEASE_CAPSULE | Freq: Every day | ORAL | Status: DC

## 2015-02-12 NOTE — Progress Notes (Signed)
  Subjective:    Christy Barnes is a 19 y.o. old female here for abdominal pain and STI screening.    HPI Abdominal pain - comes and goes, seems worse when laying on her stomach.  History of gastritis vs GERD for which she was treated with omeprazole.  She reports that the omeprazole helped and her symptoms had resolved so she stopped using it several weeks ago.    Sore throat - For 1-2 weeks.  No fever.  No change in appetite.  No headache or abdominal pain  Screening for STI -  One female sexual partner.  No new sexual partners since her last STI screening; her partner does not use condoms.  Sites of exposure: oral and vaginal.    Eczema - uses cocoa butter and coconut oil daily.  She reports that her eczema is mostly on her face.  She is almost out of her Triamcinolone 0.1% ointment and she would like a refill.    Review of Systems  No vomiting, no diarrhea, no rash.    History and Problem List: Alyissa has Hair loss; Presence of subdermal contraceptive device; Obesity (BMI 30-39.9); Acne; Allergic rhinitis; Vulvitis; and Gastroesophageal reflux on her problem list.  Skar[place]th  has a past medical history of Overweight and obesity(278.0) and Labial tear (freshman year).     Objective:    Temp(Src) 98.3 F (36.8 C)  Wt 174 lb 12.8 oz (79.289 kg) Physical Exam  Constitutional: She is oriented to person, place, and time. She appears well-developed and well-nourished. No distress.  HENT:  Head: Normocephalic.  Nose: Nose normal.  Mouth/Throat: Oropharyngeal exudate (Purulent tonsillar exudate) present.  Eyes: Conjunctivae are normal. Right eye exhibits no discharge. Left eye exhibits no discharge.  Neck: Normal range of motion. Neck supple.  Cardiovascular: Normal rate, regular rhythm and normal heart sounds.   No murmur heard. Pulmonary/Chest: Effort normal and breath sounds normal.  Abdominal: Soft. There is tenderness (mild epigastric tenderness).  Neurological: She is alert and oriented  to person, place, and time.  Skin: Skin is warm and dry.  Psychiatric: She has a normal mood and affect.  Nursing note and vitals reviewed.      Assessment and Plan:   Kelda is a 19 y.o. old female with:  1. Routine screening for STI (sexually transmitted infection) - GC/chlamydia probe amp, urine - GC/CT Probe, Amp (Throat) - HIV antibody - RPR - HSV(herpes smplx)abs-1+2(IgG+IgM)-bld  2. Need for vaccination Patient counseled regarding vaccination below. - Flu vaccine nasal quad  3. Negative pregnancy test - POCT urine pregnancy  4. Gastroesophageal reflux Restart GERD.  Return precautions reviewed - recommended stool antigen testing for H pylori but patient declined.   - omeprazole (PRILOSEC) 20 MG capsule; Take 1 capsule (20 mg total) by mouth daily.  Dispense: 31 capsule; Refill: 3  5. Exudative pharyngitis Rapid strep negative.  Will send throat culture and GC/ Chlamydia throat.   - GC/CT Probe, Amp (Throat) - POCT rapid strep A - Culture, Group A Strep  6. Eczema Reviewed skin cares including BID moisturizing with bland emollient and hypoallergenic soaps/detergents. - triamcinolone ointment (KENALOG) 0.1 %; Apply 1 application topically 2 (two) times daily. As needed for dry itchy skin  Dispense: 80 g; Refill: 2   Return in about 3 months (around 05/15/2015) for 19 year old [place] with Dr. Owens Shark.  Staley Budzinski, Bascom Levels, MD

## 2015-02-13 LAB — GC/CHLAMYDIA PROBE AMP, URINE
Chlamydia, Swab/Urine, PCR: NEGATIVE
GC Probe Amp, Urine: NEGATIVE

## 2015-02-13 LAB — HIV ANTIBODY (ROUTINE TESTING W REFLEX): HIV 1&2 Ab, 4th Generation: NONREACTIVE

## 2015-02-13 LAB — RPR

## 2015-02-14 LAB — CULTURE, GROUP A STREP: Organism ID, Bacteria: NORMAL

## 2015-02-15 LAB — HSV(HERPES SMPLX)ABS-I+II(IGG+IGM)-BLD
HSV 1 Glycoprotein G Ab, IgG: <0.1 IV
HSV 2 Glycoprotein G Ab, IgG: <0.1 IV
Herpes Simplex Vrs I&II-IgM Ab (EIA): 0.56 INDEX

## 2015-02-15 LAB — GC/CHLAMYDIA PROBE, AMP (THROAT)
Chlamydia trachomatis RNA: NOT DETECTED
Neisseria gonorrhoeae RNA: NOT DETECTED

## 2015-03-29 ENCOUNTER — Ambulatory Visit (INDEPENDENT_AMBULATORY_CARE_PROVIDER_SITE_OTHER): Payer: Medicaid Other | Admitting: Pediatrics

## 2015-03-29 ENCOUNTER — Encounter: Payer: Self-pay | Admitting: Pediatrics

## 2015-03-29 VITALS — Temp 97.4°F | Wt 171.4 lb

## 2015-03-29 DIAGNOSIS — N949 Unspecified condition associated with female genital organs and menstrual cycle: Secondary | ICD-10-CM | POA: Diagnosis not present

## 2015-03-29 DIAGNOSIS — J029 Acute pharyngitis, unspecified: Secondary | ICD-10-CM | POA: Diagnosis not present

## 2015-03-29 DIAGNOSIS — Z113 Encounter for screening for infections with a predominantly sexual mode of transmission: Secondary | ICD-10-CM | POA: Diagnosis not present

## 2015-03-29 LAB — POCT RAPID STREP A (OFFICE): Rapid Strep A Screen: NEGATIVE

## 2015-03-29 NOTE — Patient Instructions (Addendum)
Your rapid strep test was negative but a throat culture has been sent. The final report will return on Wednesday or Thursday and I will call you with the results.   Your current symptoms are compatible with your allergies and I advise you restart the cetirizine and the Flonase nasal spray. You have refills available through your pharmacy. The cetirizine may make you sleepy, so you may wish to take it at bedtime. Remember to rinse your mouth/short gargle after use of the nasal spray and spit it out.  I will also call you with the results of your urine screening tests. I recommend practice protected intercourse with use of condoms. Try sitz baths (tub soak in comfortably warm water) to soothe and genital discomfort. Avoid perfumed soaps and tight fitting garments. Dove soap for sensitive skin is a good choice for cleaning the genital area or use plain water.  Discuss an adult healthcare provider in [facility] Medicine or Internal Medicine with your parents. We will be glad to refer you to [facility], if needed. Keep your appointment with Dr. Owens Shark on 6/26 for this year's physical to avoid any lapse in care while you transition to adult medicine.  Dr. Henrene Pastor will still be able to continue your contraceptive management.
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
## 2015-03-31 ENCOUNTER — Telehealth: Payer: Self-pay | Admitting: *Deleted

## 2015-03-31 LAB — CULTURE, GROUP A STREP: Organism ID, Bacteria: NORMAL

## 2015-03-31 LAB — GC/CHLAMYDIA PROBE AMP, URINE
Chlamydia, Swab/Urine, PCR: NEGATIVE
GC Probe Amp, Urine: NEGATIVE

## 2015-03-31 NOTE — Telephone Encounter (Signed)
Caller requested lab results from last visit.  Called her back and gave left message on her voicemail stating that her results were negative.  Inquired as to how she was feeling and encouraged her to call back with questions or concerns.

## 2015-05-14 ENCOUNTER — Ambulatory Visit: Payer: Medicaid Other | Admitting: Pediatrics

## 2015-06-25 ENCOUNTER — Ambulatory Visit (INDEPENDENT_AMBULATORY_CARE_PROVIDER_SITE_OTHER): Payer: Medicaid Other | Admitting: Pediatrics

## 2015-06-25 ENCOUNTER — Encounter: Payer: Self-pay | Admitting: Pediatrics

## 2015-06-25 VITALS — BP 110/64 | Ht 63.0 in | Wt 177.0 lb

## 2015-06-25 DIAGNOSIS — IMO0002 Reserved for concepts with insufficient information to code with codable children: Secondary | ICD-10-CM

## 2015-06-25 DIAGNOSIS — Z0289 Encounter for other administrative examinations: Secondary | ICD-10-CM

## 2015-06-25 DIAGNOSIS — Z113 Encounter for screening for infections with a predominantly sexual mode of transmission: Secondary | ICD-10-CM | POA: Diagnosis not present

## 2015-06-25 DIAGNOSIS — L700 Acne vulgaris: Secondary | ICD-10-CM

## 2015-06-25 DIAGNOSIS — E668 Other obesity: Secondary | ICD-10-CM | POA: Diagnosis not present

## 2015-06-25 LAB — HEMOGLOBIN A1C
Hgb A1c MFr Bld: 5.3 % (ref <5.7)
Mean Plasma Glucose: 105 mg/dL (ref <117)

## 2015-06-25 MED ORDER — ADAPALENE 0.1 % EX LOTN
1.0000 | TOPICAL_LOTION | Freq: Every day | CUTANEOUS | Status: DC
Start: 2015-06-25 — End: 2015-11-24

## 2015-06-25 MED ORDER — CLINDAMYCIN PHOS-BENZOYL PEROX 1-5 % EX GEL: Freq: Every day | CUTANEOUS | Status: DC

## 2015-06-25 NOTE — Patient Instructions (Addendum)
  Acne Plan  Products: Face Wash:  Use a gentle cleanser, such as Cetaphil (generic version of this is fine) Moisturizer:  Use an "oil-free" moisturizer with SPF Prescription Cream(s):  Benzacline in the morning and Adapalene at bedtime  Morning: Wash face, then completely dry Apply Benzacline, pea size amount that you massage into problem areas on the face. Apply Moisturizer to entire face  Bedtime: Wash face, then completely dry Apply Adapalene, pea size amount that you massage into problem areas on the face.  Remember: - Your acne will probably get worse before it gets better - It takes at least 2 months for the medicines to start working - Use oil free soaps and lotions; these can be over the counter or store-brand - Don't use harsh scrubs or astringents, these can make skin irritation and acne worse - Moisturize daily with oil free lotion because the acne medicines will dry your skin  Call your doctor if you have: - Lots of skin dryness or redness that doesn't get better if you use a moisturizer or if you use the prescription cream or lotion every other day    Stop using the acne medicine immediately and see your doctor if you are or become pregnant or if you think you had an allergic reaction (itchy rash, difficulty breathing, nausea, vomiting) to your acne medication.

## 2015-06-25 NOTE — Progress Notes (Signed)
Routine Well-Adolescent Visit  PCP: No primary care provider on file.  Best phone number for Christy Barnes:    History was provided by the patient.  Christy Barnes is a 19 y.o. female who is here for routine evaluation.   Current concerns: Acne, left foot pain, STI testing.  Christy Barnes is a 19yo F with medical history significant for GERD, acne, and eczema presenting for routine check up today. She has been doing well since her last visit in 03/2015. Patient reports she stopped taking omeprazole about 1 month ago because she does not like taking daily medications. She feels like her GERD is manageable without medication. Christy Barnes is concerned that her acne has been spreading from forehead alone now to perioral face and neck. She is wondering if any dietary changes have caused this to occur. She is compliant with Benzacline BID but is wondering if there is a different medication that could better control her acne. Patient also with left foot pain on the sole of her foot since yesterday morning. Pain is mild and is tight and sharp in character. Finally, Christy Barnes is requesting repeat comprehensive STI testing today.   Adolescent Assessment:  Confidentiality was discussed with the patient and if applicable, with caregiver as well.  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 Work: Summer job at [employer] Activities: visit family members, goes to [employer]  With  parent out of the room and confidentiality discussed: [name] came to appointment alone)  Patient reports being comfortable and safe at school and at home? Yes  Smoking: no Secondhand smoke exposure? no Drugs/EtOH: Occasional social drink   Menstruation:   Menarche: post menarchal, onset 26 or 10 last menses if female: 06/15/2015 Menstrual History: regular every month without intermenstrual spotting and usually lasting 5 to 7 days, moderate cramping, flow is moderate  Sexuality: heterosexual Sexually active? yes   sexual partners in last year:2 contraception use: condoms, nexplanon Last STI Screening: 03/2015  Violence/Abuse: None Mood: Suicidality and Depression: Negative Weapons: None  Screenings: The patient completed the Rapid Assessment for Adolescent Preventive Services screening questionnaire and the following topics were identified as risk factors and discussed: None  In addition, the following topics were discussed as part of anticipatory guidance healthy eating, exercise and condom use.  PHQ-9 completed and results indicated score of 0.  Physical Exam:  BP 110/64 mmHg  Ht 5\' 3"  (1.6 m)  Wt 177 lb (80.287 kg)  BMI 31.36 kg/m2 Facility age limit for growth percentiles is 20 years.  General Appearance:   alert, oriented, no acute distress and obese  HENT: Normocephalic, no obvious abnormality, conjunctiva clear  Mouth:   Normal appearing teeth, no obvious discoloration, has fillings  Neck:   Supple; thyroid: no enlargement, symmetric, no tenderness/mass/nodules  Lungs:   Clear to auscultation bilaterally, normal work of breathing  Heart:   Regular rate and rhythm, S1 and S2 normal, no murmurs;   Abdomen:   Soft, non-tender, no mass, or organomegaly  GU normal female external genitalia, pelvic not performed  Musculoskeletal:  Tone and strength strong and symmetrical, all extremities               Lymphatic:   No cervical adenopathy  Skin/Hair/Nails:   Skin warm,  dry and intact, no rashes, no bruises or petechiae, acneiform lesions on forehead and chin, fine eczematous papules on neck (R>L)  Neurologic:   Strength, gait, and coordination normal and age-appropriate    Assessment/Plan:  1. Encounter for other specified administrative purpose   2. Routine screening for STI (sexually transmitted infection) - Discussed consistent condom use with Sussan and provided her with condoms on today's visit. - STI labs as below:  - GC/chlamydia probe amp, urine  - RPR  - HIV antibody  3. Adult BMI > 30 - BMI: is not appropriate for age - Encouraged her to continue exercising as she has been. Discussed the healthy plate and elimination of fastfood and sweet tea from her diet. - labs as below:  - Cholesterol, total  - HDL cholesterol  - Hemoglobin A1c  4. Acne vulgaris - Informed patient that diet is unlikely to be cause of worsening acne. She has been compliant with Benzacline BID. Will change acne regimen to Benzacline QAM and Adapalene QHS for improved control. Informed patient that she may notice increased skin dryness after starting this regimen. Also discussed that it may take 1-2 months for her to notice skin changes.  - clindamycin-benzoyl peroxide (BENZACLIN) gel; Apply topically daily.  Dispense: 50 g; Refill: 5 - Adapalene (DIFFERIN) 0.1 % LOTN; Apply 1 application topically at bedtime.  Dispense: 59 mL; Refill: 6  5. Foot pain - Foot pain as described is not inhibiting her activity and has not been persistent. Informed her that this is most likely muscle strain and pain should slowly improve. If not she can return to clinic for further evaluation.   - Follow-up visit in 1 year for next visit, or sooner as needed.   Verdie Shire, MD

## 2015-06-26 LAB — CHOLESTEROL, TOTAL: Cholesterol: 153 mg/dL (ref 125–170)

## 2015-06-26 LAB — HIV ANTIBODY (ROUTINE TESTING W REFLEX): HIV 1&2 Ab, 4th Generation: NONREACTIVE

## 2015-06-26 LAB — HDL CHOLESTEROL: HDL: 69 mg/dL (ref >=46)

## 2015-06-26 LAB — GC/CHLAMYDIA PROBE AMP, URINE
Chlamydia, Swab/Urine, PCR: NEGATIVE
GC Probe Amp, Urine: NEGATIVE

## 2015-06-26 LAB — RPR

## 2015-06-29 NOTE — Progress Notes (Signed)
I saw and evaluated the patient, performing the key elements of the service. I developed the management plan that is described in the resident's note, and I agree with the content.  Kate Ettefagh, MD  

## 2015-11-01 ENCOUNTER — Encounter: Payer: Self-pay | Admitting: Pediatrics

## 2015-11-01 ENCOUNTER — Ambulatory Visit (INDEPENDENT_AMBULATORY_CARE_PROVIDER_SITE_OTHER): Payer: Medicaid Other | Admitting: Pediatrics

## 2015-11-01 DIAGNOSIS — Z113 Encounter for screening for infections with a predominantly sexual mode of transmission: Secondary | ICD-10-CM | POA: Diagnosis not present

## 2015-11-01 DIAGNOSIS — B9789 Other viral agents as the cause of diseases classified elsewhere: Secondary | ICD-10-CM

## 2015-11-01 DIAGNOSIS — J029 Acute pharyngitis, unspecified: Secondary | ICD-10-CM | POA: Diagnosis not present

## 2015-11-01 DIAGNOSIS — J069 Acute upper respiratory infection, unspecified: Secondary | ICD-10-CM | POA: Diagnosis not present

## 2015-11-01 LAB — POCT RAPID STREP A (OFFICE): Rapid Strep A Screen: NEGATIVE

## 2015-11-01 NOTE — Progress Notes (Signed)
Subjective:    Patient ID: Christy Barnes, female    DOB: [birth year], 19 y.o.   MRN: [id number]  Christy Barnes is a 19 y.o. female presenting on 11/01/2015 for Sore Throat  Patient presents for a same day appointment.  HPI  SORE THROAT - Reports feeling sick for the past week. Had "worst sore throat", associated with subjective fevers feeling "hot and cold" and had nasal and sinus congestion. Now she states she has improved overall, but still has persistent irritated sore throat worst in mornings, then gets better, sometimes worse in evening. Now has developed more productive cough, sneezing, still nasal congestion.  - Tried DayQuil and NyQuil stopped after about 1 week. - No recent illnesses or antibiotic courses. States that her roommate sleeps with "window open" and gets cold at night, thinks this is related to current illness. - Currently in school and was doing online finals last week, did not miss any time - Denies any active fever, sweats, abdominal pain, nausea, vomiting  STD TESTING - Currently sexually active, with same partner, condoms sometimes (not always), also admits to oral sex within few days of onset of symptoms. Denies any known STD exposure. No new partners since last STD testing 06/2015 with Urine GC/Chlamydia (negative) and HIV / RPR non-reactive. - Currently has Nexplanon in Left arm (placed 04/2013), next due switch 04/2016 No LMP recorded. Patient has had an implant. - Denies any vaginal discharge, headache, rash  Past Medical History  Diagnosis Date  . Overweight and obesity(278.0)   . Labial tear freshman year    Social History   Social History  . Marital Status: Single    Spouse Name: N/A  . Number of Children: N/A  . Years of Education: N/A   Occupational History  . Not on file.   Social History Main Topics  . Smoking status: Never Smoker   . Smokeless tobacco: Not on file  . Alcohol Use: No  . Drug Use: No  . Sexual Activity: Not on  file   Other Topics Concern  . Not on file   Social History Narrative    Current Outpatient Prescriptions on File Prior to Visit  Medication Sig  . Adapalene (DIFFERIN) 0.1 % LOTN Apply 1 application topically at bedtime. (Patient not taking: Reported on 11/01/2015)  . clindamycin-benzoyl peroxide (BENZACLIN) gel Apply topically daily. (Patient not taking: Reported on 11/01/2015)  . fluticasone (FLONASE) 50 MCG/ACT nasal spray Place 1 spray into both nostrils daily. 1 spray in each nostril every day (Patient not taking: Reported on 11/01/2015)  . omeprazole (PRILOSEC) 20 MG capsule Take 1 capsule (20 mg total) by mouth daily. (Patient not taking: Reported on 03/29/2015)  . triamcinolone ointment (KENALOG) 0.1 % Apply 1 application topically 2 (two) times daily. As needed for dry itchy skin (Patient not taking: Reported on 11/01/2015)  . [DISCONTINUED] desloratadine (CLARINEX) 5 MG tablet Take 1 tablet (5 mg total) by mouth daily.   Current Facility-Administered Medications on File Prior to Visit  Medication  . etonogestrel (IMPLANON) implant 68 mg    Review of Systems Per HPI unless specifically indicated above     Objective:    Temp(Src) 98.7 F (37.1 C) (Temporal)  Wt 182 lb 3.2 oz (82.645 kg)  Wt Readings from Last 3 Encounters:  11/01/15 182 lb 3.2 oz (82.645 kg)  06/25/15 177 lb (80.287 kg)  03/29/15 171 lb 6.4 oz (77.747 kg)    Physical Exam  Constitutional: She is oriented to person, place,  and time. She appears well-developed and well-nourished. No distress.  Well-appearing, comfortable, cooperative  HENT:  Head: Normocephalic and atraumatic.  Nose: Nose normal.  Mouth/Throat: No oropharyngeal exudate.  Oropharynx with symmetrical mild general erythema and mild enlarged tonsils without exudates. No post-nasal drip obvious, no asymmetry, uvula midline. TM's clear without erythema or bulging.  Eyes: Conjunctivae and EOM are normal. Pupils are equal, round, and reactive  to light. Right eye exhibits no discharge. Left eye exhibits no discharge.  Neck: Normal range of motion. Neck supple. No thyromegaly present.  Cardiovascular: Normal rate, regular rhythm, normal heart sounds and intact distal pulses.   No murmur heard. Pulmonary/Chest: Effort normal and breath sounds normal. No respiratory distress. She has no wheezes. She has no rales.  Lymphadenopathy:    She has no cervical adenopathy (Anterior or Posterior).  Neurological: She is alert and oriented to person, place, and time.  Skin: Skin is warm and dry. No rash noted. She is not diaphoretic.  Nursing note and vitals reviewed.  Results for orders placed or performed in visit on 11/01/15  POCT rapid strep A  Result Value Ref Range   Rapid Strep A Screen Negative Negative      Assessment & Plan:   Problem List Items Addressed This Visit    None    Visit Diagnoses    Acute pharyngitis, unspecified pharyngitis type        Relevant Orders    POCT rapid strep A (Completed)    GC/CT Probe, Amp (Throat)    Viral URI with cough        Screening examination for venereal disease        Relevant Orders    GC/chlamydia probe amp, urine       No orders of the defined types were placed in this encounter.    Consistent with viral syndrome with initial viral pharyngitis then seems improved and now viral URI with post-nasal drainage causing sore throat. Prior subjective fevers, but seems resolved, currently afebrile. Centor Score 1 with Rapid Strep Negative. No significant LAD to suggest mono, but may consider on differential. No focal signs of infection (TM's clear, lungs clear). Given prior history of unprotected sexual intercourse, patient requesting STD testing and admits to oral sex recently, possible gonorrhea pharyngitis (clinically throat with erythema but no exudates, may still be possible, but less likely with no known exposure or new partners).  Plan: 1. Rapid strep negative today 2.  Reassurance, likely viral etiology of pharyngitis / URI, should be self limited within 7-10 days 3. Check Urine GC/Chlamydia per patient request. Also add GC/Chlamydia Throat probe to evaluate gonorrhea pharyngitis 4. Supportive care with Ibuprofen, Mucinex-DM, improve hydration, warm tea with honey, may use prior Zyrtec, Flonase 5. Return criteria given  Future reminder for follow-up June 2017 for Nexplanon replacement.  Follow up plan: Return in about 1 week (around 11/08/2015), or if symptoms worsen or fail to improve.  Nobie Putnam, [place], PGY-3

## 2015-11-01 NOTE — Progress Notes (Signed)
I saw and evaluated the patient, performing the key elements of the service. I developed the management plan that is described in the resident's note, and I agree with the content.   Georgia Duff B                  11/01/2015, 3:57 PM

## 2015-11-01 NOTE — Patient Instructions (Signed)
Thank you for coming in to clinic today.  1. It sounds like you have a Upper Respiratory Virus - this will most likely run it's course in 7 to 10 days. It probably started as a Viral Pharyngitis (sore throat), your Rapid Strep Throat test was NEGATIVE. - We tested your Urine for Gonorrhea/Chlamydia, and also tested Throat Swab for Gonorrhea - Recommend to start taking Ibuprofen / Motrin 400 to 600 mg every 6 to 8 hours as needed for Sore throat for next 3 to 5 days - Start taking Mucinex-DM for cough twice daily, only take maximum of 7 days - Drink plenty of fluids to improve congestion - You may try over the counter Nasal Saline spray (Simply Saline, Ocean Spray) as needed to reduce congestion. - Drink warm herbal tea with honey for sore throat - If you want to resume your Zyrtec this may help congestion, also can resume nasal steroid spray for now if needed  Due for Nexplanon Arm Implant replace June 2017 - please schedule this in the future for removal and replacement  If develops fevers worsening despite medicine, headaches, worsening sore throat, productive cough or vomiting, not able to tolerate food/drink, please call or return for follow-up.  Please schedule a follow-up appointment with your doctor for annual physical in June 2017  If you have any other questions or concerns, please feel free to call the clinic to contact me. You may also schedule an earlier appointment if necessary.  However, if your symptoms get significantly worse, please go to the Emergency Department to seek immediate medical attention.  Nobie Putnam, [place]

## 2015-11-03 LAB — GC/CHLAMYDIA PROBE AMP, URINE
Chlamydia, Swab/Urine, PCR: NOT DETECTED
GC Probe Amp, Urine: NOT DETECTED

## 2015-11-06 LAB — GC/CHLAMYDIA PROBE, AMP (THROAT)
Chlamydia trachomatis RNA: NOT DETECTED
Neisseria gonorrhoeae RNA: NOT DETECTED

## 2015-11-24 ENCOUNTER — Encounter: Payer: Self-pay | Admitting: Pediatrics

## 2015-11-24 ENCOUNTER — Ambulatory Visit (INDEPENDENT_AMBULATORY_CARE_PROVIDER_SITE_OTHER): Payer: Medicaid Other | Admitting: Pediatrics

## 2015-11-24 VITALS — Temp 98.8°F | Wt 185.8 lb

## 2015-11-24 DIAGNOSIS — R040 Epistaxis: Secondary | ICD-10-CM | POA: Insufficient documentation

## 2015-11-24 DIAGNOSIS — A084 Viral intestinal infection, unspecified: Secondary | ICD-10-CM | POA: Diagnosis not present

## 2015-11-24 DIAGNOSIS — R110 Nausea: Secondary | ICD-10-CM | POA: Diagnosis not present

## 2015-11-24 LAB — POCT URINE PREGNANCY: Preg Test, Ur: NEGATIVE

## 2015-11-24 MED ORDER — FLUTICASONE PROPIONATE 50 MCG/ACT NA SUSP: 1.0000 | Freq: Two times a day (BID) | NASAL | Status: DC

## 2015-11-24 NOTE — Progress Notes (Signed)
History was provided by the patient and mother.  Christy Barnes is a 20 y.o. female who is here for 3 days of cough and congestion and 1 day of nausea and diarrhea.  She has to have a bowel movement every 5 minutes and it is watery and green, no bloody.  Diarrhea started last night.  Subjective fever. No traveling outside the country, no eating outside the home.  Mom had similar symptoms before she did. Patient hasn't had a cycle in almost 5 or 6 weeks, she is on Implanon however she has had a cycle every 4 weeks while being on the Implanon.  She also brought up how she had a few prolonged nose bleeds last week.   She states it took 10 to 30 minutes for it to stop.   The following portions of the patient's history were reviewed and updated as appropriate: allergies, current medications, past family history, past medical history, past social history, past surgical history and problem list.  Review of Systems  Constitutional: Negative for fever and weight loss.  HENT: Positive for congestion. Negative for ear discharge, ear pain and sore throat.   Eyes: Negative for pain, discharge and redness.  Respiratory: Positive for cough. Negative for shortness of breath.   Cardiovascular: Negative for chest pain.  Gastrointestinal: Positive for nausea, abdominal pain and diarrhea. Negative for vomiting.  Genitourinary: Negative for frequency and hematuria.  Musculoskeletal: Negative for back pain, falls and neck pain.  Skin: Negative for rash.  Neurological: Negative for speech change, loss of consciousness and weakness.  Endo/Heme/Allergies: Does not bruise/bleed easily.  Psychiatric/Behavioral: The patient does not have insomnia.      Physical Exam:  Temp(Src) 98.8 F (37.1 C) (Temporal)  Wt 185 lb 12.8 oz (84.278 kg) HR: 90  Facility age limit for growth percentiles is 20 years. No LMP recorded. Patient has had an implant.  General:   alert, cooperative, appears stated age and no distress      Skin:   normal, 2 second capillary refill   Oral cavity:   lips, mucosa, and tongue normal; teeth and gums normal  Eyes:   sclerae white, mild allergic shiners bilaterally   Ears:   normal bilaterally  Nose: clear, no discharge, no nasal flaring, no active bleeding but did see healing white tissue   Neck:  Neck appearance: Normal, no lymphadenopathy   Lungs:  clear to auscultation bilaterally  Heart:   regular rate and rhythm, S1, S2 normal, no murmur, click, rub or gallop   Abdomen:  soft, non-tender; slightly hyperactive bowel sounds; no masses,  no organomegaly  GU:  not examined  Extremities:   extremities normal, atraumatic, no cyanosis or edema  Neuro:  normal without focal findings      Assessment/Plan: Patient is having symptoms from a viral gastroenteritis, she is over well hydrated and is tolerating fluids without any vomiting.  Discussed a PO goal and reasons to return to care.  The epistaxis is most likely due to her uncontrolled allergies, wrote a new script for Flonase and told her to follow-up if epistaxis is still a problem.   1. Nausea - POCT urine pregnancy( negative)   2. Viral gastroenteritis Discussed supportive care and gave a handout  - discussed maintenance of good hydration - discussed signs of dehydration - discussed management of fever - discussed expected course of illness - discussed good hand washing and use of hand sanitizer - discussed with parent to report increased symptoms or no improvement  Christy Acocella Mcneil Sober, MD  11/24/2015

## 2015-11-24 NOTE — Patient Instructions (Addendum)
If she can't keep Gatorade down without vomiting then return to care If the symptoms last longer than 7 days return to care If she goes longer than 12 hours without urinating than return to care   [email or web address] and can also email me at [email or web address]

## 2016-01-28 ENCOUNTER — Ambulatory Visit: Payer: Medicaid Other | Admitting: Pediatrics

## 2016-03-28 ENCOUNTER — Encounter: Payer: Self-pay | Admitting: Pediatrics

## 2016-03-28 ENCOUNTER — Ambulatory Visit (INDEPENDENT_AMBULATORY_CARE_PROVIDER_SITE_OTHER): Payer: BLUE CROSS/BLUE SHIELD | Admitting: Pediatrics

## 2016-03-28 VITALS — Temp 97.7°F | Wt 189.2 lb

## 2016-03-28 DIAGNOSIS — J301 Allergic rhinitis due to pollen: Secondary | ICD-10-CM

## 2016-03-28 DIAGNOSIS — Z113 Encounter for screening for infections with a predominantly sexual mode of transmission: Secondary | ICD-10-CM

## 2016-03-28 MED ORDER — CETIRIZINE HCL 10 MG PO TABS
10.0000 mg | ORAL_TABLET | Freq: Every day | ORAL | Status: DC
Start: 2016-03-28 — End: 2016-07-28

## 2016-03-28 NOTE — Progress Notes (Signed)
History was provided by the patient.  Christy Barnes is a 20 y.o. female presents with sore throat for 3 days and is concerned about sexual exposure.    Sore throat: on the right side it hurts more and when she swallows it is worse.  She has also been sneezing and has rhinorrhea but no coughing.  Has been having chills but no fevers.  Has been taking motrin with no change in symptoms.  No vomiting.  Has been using Flonase since March two times a day.    Headache is in the temporal region.   Headache feels like pressure.  Feels it more towards the end of the day. Sleep makes it better.  If the lights stay on for too long or loud noise it makes.   Sexual exposure: Had sex with a friend 2 weeks ago without a condom.  She hasn't had any symptoms but wants to be safe and get checked.      The following portions of the patient's history were reviewed and updated as appropriate: allergies, current medications, past family history, past medical history, past social history, past surgical history and problem list.  Review of Systems  Constitutional: Negative for fever and weight loss.  HENT: Positive for congestion and sore throat. Negative for ear discharge and ear pain.   Eyes: Negative for pain, discharge and redness.  Respiratory: Positive for cough. Negative for shortness of breath.   Cardiovascular: Negative for chest pain.  Gastrointestinal: Negative for vomiting and diarrhea.  Genitourinary: Negative for frequency and hematuria.  Musculoskeletal: Negative for back pain, falls and neck pain.  Skin: Negative for rash.  Neurological: Positive for headaches. Negative for speech change, loss of consciousness and weakness.  Endo/Heme/Allergies: Does not bruise/bleed easily.  Psychiatric/Behavioral: The patient does not have insomnia.      Physical Exam:  Temp(Src) 97.7 F (36.5 C)  Wt 189 lb 3.2 oz (85.821 kg)  Facility age limit for growth percentiles is 20 years.  General:   alert,  cooperative, appears stated age and no distress  Oral cavity:   lips, mucosa, and tongue normal; teeth and gums normal, tonsil slightly enlarged on the right but not erythematous and no petechiae.  No exudate.   Eyes:   sclerae white, allergic shiners   Ears:   normal bilaterally  Nose: clear, no discharge, no nasal flaring  Neck:  Neck appearance: Normal, no masses or tenderness and has normal range of motion   Lungs:  clear to auscultation bilaterally  Heart:   regular rate and rhythm, S1, S2 normal, no murmur, click, rub or gallop   Neuro:  normal without focal findings     Assessment/Plan: 1. Routine screening for STI (sexually transmitted infection) - RPR - HIV antibody - GC/Chlamydia Probe Amp  2. Allergic rhinitis due to pollen, unspecified rhinitis seasonality - cetirizine (ZYRTEC) 10 MG tablet; Take 1 tablet (10 mg total) by mouth daily.  Dispense: 30 tablet; Refill: 2     Cheynne Virden Mcneil Sober, MD  03/28/2016

## 2016-03-29 ENCOUNTER — Encounter: Payer: Self-pay | Admitting: Pediatrics

## 2016-03-29 LAB — RPR

## 2016-03-29 LAB — HIV ANTIBODY (ROUTINE TESTING W REFLEX): HIV 1&2 Ab, 4th Generation: NONREACTIVE

## 2016-03-29 LAB — GC/CHLAMYDIA PROBE AMP
CT Probe RNA: NOT DETECTED
GC Probe RNA: NOT DETECTED

## 2016-04-03 ENCOUNTER — Encounter: Payer: Self-pay | Admitting: Family

## 2016-04-03 ENCOUNTER — Ambulatory Visit (INDEPENDENT_AMBULATORY_CARE_PROVIDER_SITE_OTHER): Payer: Medicaid Other | Admitting: Family

## 2016-04-03 VITALS — BP 112/70 | HR 82 | Ht 63.39 in | Wt 188.8 lb

## 2016-04-03 DIAGNOSIS — Z113 Encounter for screening for infections with a predominantly sexual mode of transmission: Secondary | ICD-10-CM

## 2016-04-03 DIAGNOSIS — Z3046 Encounter for surveillance of implantable subdermal contraceptive: Secondary | ICD-10-CM

## 2016-04-03 DIAGNOSIS — Z3049 Encounter for surveillance of other contraceptives: Secondary | ICD-10-CM | POA: Diagnosis not present

## 2016-04-03 DIAGNOSIS — Z30017 Encounter for initial prescription of implantable subdermal contraceptive: Secondary | ICD-10-CM | POA: Diagnosis not present

## 2016-04-03 LAB — POCT RAPID HIV: Rapid HIV, POC: NEGATIVE

## 2016-04-03 NOTE — Progress Notes (Signed)
Patient ID: Christy Barnes, female   DOB: Aug 02, 1996, 20 y.o.   MRN: [id number] Pre-Visit Planning  Christy Barnes  is a 20 y.o. female referred by Sarajane Jews, MD.   Last seen in [facility] on 01/27/14 for irregular uterine bleeding with Nexplanon, URI, negative STI screenings at that [place].   Plan at last visit included continue nexplanon, URI symptom mngt.  Date and Type of Previous Psych Screenings? NA  Clinical Staff Visit Tasks:   - Urine GC/CT due? yes - HIV Screening due?  yes - Psych Screenings Due? NA - prepare for nex removal + reinsertion  Provider Visit Tasks: - contraceptive counseling, provide method, update sexual hx/menstrual patterns - [facility] Involvement? No - Pertinent Labs? No  >2 minutes spent reviewing records and planning for patient's visit.

## 2016-04-03 NOTE — Procedures (Signed)
Nexplanon Insertion  No contraindications for placement.  No liver disease, no unexplained vaginal bleeding, no h/o breast cancer, no h/o blood clots.  Patient's last menstrual period was 03/19/2016.  [facility]: nexplanon in place  Last Unprotected sex:  NA  Risks & benefits of Nexplanon discussed The nexplanon device was purchased and supplied by [facility]. Packaging instructions supplied to patient Consent form signed  The patient denies any allergies to anesthetics or antiseptics.  Procedure: Pt was placed in supine position. The left arm was flexed at the elbow and externally rotated so that her wrist was parallel to her ear The medial epicondyle of the left arm was identified The insertions site was marked 8 cm proximal to the medial epicondyle The insertion site was cleaned with Betadine The area surrounding the insertion site was covered with a sterile drape 1% lidocaine was injected just under the skin at the insertion site extending 4 cm proximally. The sterile preloaded disposable Nexaplanon applicator was removed from the sterile packaging The applicator needle was inserted at a 30 degree angle at 8 cm proximal to the medial epicondyle as marked The applicator was lowered to a horizontal position and advanced just under the skin for the full length of the needle The slider on the applicator was retracted fully while the applicator remained in the same position, then the applicator was removed. The implant was confirmed via palpation as being in position The implant position was demonstrated to the patient Pressure dressing was applied to the patient.  The patient was instructed to removed the pressure dressing in 24 hrs.  The patient was advised to move slowly from a supine to an upright position  The patient denied any concerns or complaints  The patient was instructed to schedule a follow-up appt in 1 month and to call sooner if any concerns.  The patient acknowledged  agreement and understanding of the plan.  Risks & benefits of Nexplanon removal discussed. Consent form signed.  The patient denies any allergies to anesthetics or antiseptics.  Procedure: Pt was placed in supine position. left arm was flexed at the elbow and externally rotated so that her wrist was parallel to her ear, The device was palpated and marked. The site was cleaned with Betadine. The area surrounding the device was covered with a sterile drape. 1% lidocaine was injected just under the device. A scalpel was used to create a small incision. The device was pushed towards the incision. Fibrous tissue surrounding the device was gradually removed from the device. The device was removed and measured to ensure all 4 cm of device was removed. Steri-strips were used to close the incision. Pressure dressing was applied to the patient.  The patient was instructed to removed the pressure dressing in 24 hrs.  The patient was advised to move slowly from a supine to an upright position  The patient denied any concerns or complaints  The patient was instructed to schedule a follow-up appt in 1 month.

## 2016-04-03 NOTE — Patient Instructions (Signed)
Follow-up with in 1 month. Schedule this appointment before you leave clinic today.  Your Nexplanon was removed today and is no longer preventing pregnancy.  If you have sex, remember to use condoms to prevent pregnancy and to prevent sexually transmitted infections.  Leave the outside bandage on for 24 hours.  Leave the smaller bandages on for 3-5 days or until they fall off on their own.  Keep the area clean and dry for 3-5 days.  There is usually bruising or swelling at and around the removal site for a few days to a week after the removal.  If you see redness or pus draining from the removal site, call us immediately.  We would like you to return to the clinic for a follow-up visit in 1 month.  You can call [facility] 24 hours a day with any questions or concerns.  There is always a nurse or doctor available to take your call.  Call 9-1-1 if you have a life-threatening emergency.  For anything else, please call us at [phone] before heading to the ER.   Congratulations on getting your Nexplanon placement!  Below is some important information about Nexplanon.  First remember that Nexplanon does not prevent sexually transmitted infections.  Condoms will help prevent sexually transmitted infections. The Nexplanon starts working 7 days after it was inserted.  There is a risk of getting pregnant if you have unprotected sex in those first 7 days after placement of the Nexplanon.  The Nexplanon lasts for 3 years but can be removed at any time.  You can become pregnant as early as 1 week after removal.  You can have a new Nexplanon put in after the old one is removed if you like.  It is not known whether Nexplanon is as effective in women who are very overweight because the studies did not include many overweight women.  Nexplanon interacts with some medications, including barbiturates, bosentan, carbamazepine, felbamate, griseofulvin, oxcarbazepine, phenytoin, rifampin,  St. John's wort, topiramate, HIV medicines.  Please alert your doctor if you are on any of these medicines.  Always tell other healthcare providers that you have a Nexplanon in your arm.  The Nexplanon was placed just under the skin.  Leave the outside bandage on for 24 hours.  Leave the smaller bandage on for 3-5 days or until it falls off on its own.  Keep the area clean and dry for 3-5 days. There is usually bruising or swelling at the insertion site for a few days to a week after placement.  If you see redness or pus draining from the insertion site, call us immediately.  Keep your user card with the date the implant was placed and the date the implant is to be removed.  The most common side effect is a change in your menstrual bleeding pattern.   This bleeding is generally not harmful to you but can be annoying.  Call or come in to see [place] if you have any concerns about the bleeding or if you have any side effects or questions.    We will call you in 1 week to check in and we would like you to return to the clinic for a follow-up visit in 1 month.  You can call [facility] 24 hours a day with any questions or concerns.  There is always a nurse or doctor available to take your call.  Call 9-1-1 if you have a life-threatening emergency.  For anything  else, please call us at [phone] before heading to the ER.

## 2016-04-03 NOTE — Progress Notes (Signed)
THIS RECORD MAY CONTAIN CONFIDENTIAL INFORMATION THAT SHOULD NOT BE RELEASED WITHOUT REVIEW OF THE SERVICE PROVIDER.  Adolescent Medicine Consultation Follow-Up Visit Christy Barnes  is a 20 y.o. female referred by Sarajane Jews, * here today for follow-up.    Previsit planning completed:  yes  Growth Chart Viewed? no   History was provided by the patient.  PCP Confirmed?  Landry Mellow MD  My Chart Activated?   yes   HPI:    20 yo female presents to have nexplanon removed and reinserted.  Has regular cycles, bleeds about 4-5 days. No BTB.  Denies vaginal discharge changes, pelvic or abdominal pain, or painful intercourse.  Has no further questions.    Patient's last menstrual period was 03/19/2016. No Known Allergies Outpatient Prescriptions Prior to Visit  Medication Sig Dispense Refill  . cetirizine (ZYRTEC) 10 MG tablet Take 1 tablet (10 mg total) by mouth daily. 30 tablet 2  . fluticasone (FLONASE) 50 MCG/ACT nasal spray Place 1 spray into both nostrils 2 (two) times daily. (Patient not taking: Reported on 03/28/2016) 16 g 12  . etonogestrel (IMPLANON) implant 68 mg      No facility-administered medications prior to visit.     Patient Active Problem List   Diagnosis Date Noted  . Epistaxis 11/24/2015  . Vulvitis 06/29/2014  . Gastroesophageal reflux 06/29/2014  . Allergic rhinitis 05/08/2014  . Obesity (BMI 30-39.9) 11/04/2013  . Acne 11/04/2013  . Presence of subdermal contraceptive device 05/04/2013  . Hair loss 04/16/2013    Confidentiality was discussed with the patient and if applicable, with caregiver as well.  Patient's personal or confidential phone number: [phone]  The following portions of the patient's history were reviewed and updated as appropriate: allergies, current medications, past family history, past medical history, past social history, past surgical history and problem list.  Physical Exam:  Filed Vitals:   04/03/16 0849  BP:  112/70  Pulse: 82  Height: 5' 3.39" (1.61 m)  Weight: 188 lb 12.8 oz (85.639 kg)   BP 112/70 mmHg  Pulse 82  Ht 5' 3.39" (1.61 m)  Wt 188 lb 12.8 oz (85.639 kg)  BMI 33.04 kg/m2  LMP 03/19/2016 Body mass index: body mass index is 33.04 kg/(m^2). Facility age limit for growth percentiles is 20 years.  Physical Exam  Constitutional: She appears well-developed and well-nourished.  HENT:  Head: Normocephalic.  Eyes: EOM are normal. Pupils are equal, round, and reactive to light. No scleral icterus.  Neck: Normal range of motion.  Cardiovascular: Normal rate.   Pulmonary/Chest: Effort normal.  Musculoskeletal: Normal range of motion.  Neurological: She is alert.  Skin: Skin is warm and dry. No rash noted.  Implant palpable in L arm, then removed per procedure note. New implant palpable in L arm after insertion per procedure note.   Psychiatric: She has a normal mood and affect.     Assessment/Plan: 1. Nexplanon removal Per procedure note  2. Nexplanon insertion Per procedure note  - Subdermal Etonogestrel Implant Insertion; Standing - Subdermal Etonogestrel Implant Insertion  3. Routine screening for STI (sexually transmitted infection) Per protocol  - POCT Rapid HIV - GC/Chlamydia Probe Amp   Follow-up:  Return in about 4 weeks (around 05/01/2016) for with Dierdre Harness, FNP-C.   Medical decision-making:  > 25 minutes spent, more than 50% of appointment was spent discussing diagnosis and management of symptoms

## 2016-04-04 LAB — GC/CHLAMYDIA PROBE AMP
CT Probe RNA: NOT DETECTED
GC Probe RNA: NOT DETECTED

## 2016-04-14 ENCOUNTER — Encounter: Payer: Self-pay | Admitting: Pediatrics

## 2016-04-15 ENCOUNTER — Ambulatory Visit (INDEPENDENT_AMBULATORY_CARE_PROVIDER_SITE_OTHER): Payer: BLUE CROSS/BLUE SHIELD | Admitting: Pediatrics

## 2016-04-15 ENCOUNTER — Encounter: Payer: Self-pay | Admitting: Pediatrics

## 2016-04-15 VITALS — Wt 190.8 lb

## 2016-04-15 DIAGNOSIS — L298 Other pruritus: Secondary | ICD-10-CM

## 2016-04-15 DIAGNOSIS — Z3002 Counseling and instruction in natural family planning to avoid pregnancy: Secondary | ICD-10-CM

## 2016-04-15 DIAGNOSIS — N898 Other specified noninflammatory disorders of vagina: Secondary | ICD-10-CM

## 2016-04-15 LAB — POCT URINALYSIS DIPSTICK
Bilirubin, UA: NEGATIVE
Blood, UA: NEGATIVE
Glucose, UA: NORMAL
Ketones, UA: NEGATIVE
Leukocytes, UA: NEGATIVE
Nitrite, UA: NEGATIVE
Protein, UA: NEGATIVE
Spec Grav, UA: 1.02
Urobilinogen, UA: NEGATIVE
pH, UA: 5

## 2016-04-15 LAB — POCT URINE PREGNANCY: Preg Test, Ur: NEGATIVE

## 2016-04-15 NOTE — Progress Notes (Signed)
    Assessment and Plan:     1. Vaginal itching Hold on medication until wet prep resulted.  [name] is happy to get result with My Chart. - GC/Chlamydia Probe Amp - sent despite negative results a couple weeks ago - POCT urine pregnancy - neg - POCT urinalysis dipstick - no concerns for urine infection - Wet prep, genital   Subjective:  HPI Christy Barnes is a 20 y.o. old female here for Vaginal Itching Uncomfortable since last week in [place] when itching began Now constant awareness of itching and irritation Shaved pubis a few days ago and noticed one nick.  Bled momentarily and has not hurt since.   No creams or douches used No discharge until today noticed a little here with urination No new sexual partners No dysuria, no frequency No fever  Last seen 5/15 and had negative GC/chlamydia  Review of Systems No abdo pain No nausea No back pain  History and Problem List: Christy Barnes has Hair loss; Presence of subdermal contraceptive device; Obesity (BMI 30-39.9); Acne; Allergic rhinitis; Vulvitis; Gastroesophageal reflux; and Epistaxis on her problem list.  Christy Barnes  has a past medical history of Overweight and obesity(278.0) and Labial tear (freshman year).  Objective:   Wt 190 lb 12.8 oz (86.546 kg)  LMP 03/19/2016 Physical Exam  Constitutional: She is oriented to person, place, and time.  Obese   HENT:  Nose: Nose normal.  Mouth/Throat: Oropharynx is clear and moist.  Eyes: Conjunctivae and EOM are normal.  Neck: Neck supple. No thyromegaly present.  Cardiovascular: Normal rate, regular rhythm and normal heart sounds.   Pulmonary/Chest: Effort normal and breath sounds normal.  Abdominal: Soft. Bowel sounds are normal. There is no tenderness.  Genitourinary: Vagina normal. No vaginal discharge found.  Pubis with a few fleshy smooth bumps, no folliculitis.. No vaginal odor.  Neurological: She is alert and oriented to person, place, and time.  Skin: Skin is warm and dry. No  rash noted.  Nursing note and vitals reviewed.   Santiago Glad, MD

## 2016-04-15 NOTE — Patient Instructions (Signed)
Expect the lab result by My Chart before Monday.   You look very healthy today.  Try soaking in warm water with a tablespoon or two of baking soda added to the soak water.  Do not use any harsh soaps or vaginal cleansers. The best website for information about children is [employer]end[employer]pl.  All the information is reliable and up-to-date.    Call the main number [phone] before going to the Emergency Department unless it's a true emergency.  For a true emergency, go to the [facility] Emergency Department.  A nurse always answers the main number [phone] and a doctor is always available, even when the clinic is closed.

## 2016-04-18 LAB — GC/CHLAMYDIA PROBE AMP
CT Probe RNA: NOT DETECTED
GC Probe RNA: NOT DETECTED

## 2016-04-19 ENCOUNTER — Encounter: Payer: Self-pay | Admitting: Pediatrics

## 2016-04-19 LAB — WET PREP BY MOLECULAR PROBE
Candida species: POSITIVE — AB
Gardnerella vaginalis: NEGATIVE
Trichomonas vaginosis: NEGATIVE

## 2016-04-19 NOTE — Progress Notes (Signed)
Quick Note:  Yeast infection needs treatment - offering fluconazole 150 mg oral to patient by MyChart. First send this AM not successful. ______

## 2016-04-20 ENCOUNTER — Other Ambulatory Visit: Payer: Self-pay | Admitting: Pediatrics

## 2016-04-20 ENCOUNTER — Encounter: Payer: Self-pay | Admitting: Pediatrics

## 2016-04-20 MED ORDER — FLUCONAZOLE 150 MG PO TABS: 150.0000 mg | ORAL_TABLET | Freq: Every day | ORAL | Status: DC

## 2016-05-01 ENCOUNTER — Encounter: Payer: Self-pay | Admitting: Pediatrics

## 2016-05-02 ENCOUNTER — Encounter: Payer: Self-pay | Admitting: Family

## 2016-05-02 ENCOUNTER — Ambulatory Visit (INDEPENDENT_AMBULATORY_CARE_PROVIDER_SITE_OTHER): Payer: BLUE CROSS/BLUE SHIELD | Admitting: Family

## 2016-05-02 VITALS — BP 112/74 | HR 100 | Ht 63.78 in | Wt 189.0 lb

## 2016-05-02 DIAGNOSIS — N9089 Other specified noninflammatory disorders of vulva and perineum: Secondary | ICD-10-CM | POA: Diagnosis not present

## 2016-05-02 MED ORDER — LIDOCAINE 5 % EX OINT: 1.0000 "application " | TOPICAL_OINTMENT | CUTANEOUS | Status: DC | PRN

## 2016-05-02 NOTE — Progress Notes (Signed)
Patient ID: Christy Barnes, female   DOB: 01-27-1996, 20 y.o.   MRN: [id number] Pre-Visit Planning  Christy Barnes  is a 20 y.o. female referred by Sarajane Jews, MD.   Last seen in [facility] on 04/03/16 for nexplanon replacement.  Plan at last visit included Nexplanon removal and reinsertion. Read patient email re: Labial irritation.   Date and Type of Previous Psych Screenings? NA  Clinical Staff Visit Tasks:   - Urine GC/CT due? No, unless new partner or exposure concern - HIV Screening due?  no - Psych Screenings Due? No - prepare for GYN exam if symptomatic.  Provider Visit Tasks: - Assess Nexplanon, sx per email [facility] Involvement? No - Pertinent Labs? No  >2 minutes spent reviewing records and planning for patient's visit.

## 2016-05-02 NOTE — Patient Instructions (Signed)
We will call you with results in a few days.

## 2016-05-02 NOTE — Progress Notes (Signed)
THIS RECORD MAY CONTAIN CONFIDENTIAL INFORMATION THAT SHOULD NOT BE RELEASED WITHOUT REVIEW OF THE SERVICE PROVIDER.  Adolescent Medicine Consultation Follow-Up Visit Christy Barnes  is a 20 y.o. female referred by Sarajane Jews, * here today for follow-up.    Previsit planning completed:  Yes Patient ID: Christy Barnes, female   DOB: 11-Aug-1996, 20 y.o.   MRN: [id number] Pre-Visit Planning  Christy Barnes  is a 20 y.o. female referred by Sarajane Jews, MD.   Last seen in [facility] on 04/03/16 for nexplanon replacement.  Plan at last visit included Nexplanon removal and reinsertion. Read patient email re: Labial irritation.   Date and Type of Previous Psych Screenings? NA  Clinical Staff Visit Tasks:   - Urine GC/CT due? No, unless new partner or exposure concern - HIV Screening due?  no - Psych Screenings Due? No - prepare for GYN exam if symptomatic.  Provider Visit Tasks: - Assess Nexplanon, sx per email [facility] Involvement? No - Pertinent Labs? No  >2 minutes spent reviewing records and planning for patient's visit.   Growth Chart Viewed? not applicable   History was provided by the patient.  PCP Confirmed?  Landry Mellow  My Chart Activated?   yes   HPI:   Labia majora - shaved about 2 weeks ago, itching and she started scabbing - having some itching around clitoris too. Having some burning around urethral opening and some clitoral. No new partners; last time she was she was tested for everything and all negative.   Patient's last menstrual period was 04/21/2016. No Known Allergies Outpatient Prescriptions Prior to Visit  Medication Sig Dispense Refill  . cetirizine (ZYRTEC) 10 MG tablet Take 1 tablet (10 mg total) by mouth daily. 30 tablet 2  . fluticasone (FLONASE) 50 MCG/ACT nasal spray Place 1 spray into both nostrils 2 (two) times daily. 16 g 12  . oxymetazoline (12 HOUR NASAL SPRAY) 0.05 % nasal spray     . fluconazole  (DIFLUCAN) 150 MG tablet Take 1 tablet (150 mg total) by mouth daily. 1 tablet 0   No facility-administered medications prior to visit.     Patient Active Problem List   Diagnosis Date Noted  . Epistaxis 11/24/2015  . Vulvitis 06/29/2014  . Gastroesophageal reflux 06/29/2014  . Allergic rhinitis 05/08/2014  . Obesity (BMI 30-39.9) 11/04/2013  . Acne 11/04/2013  . Presence of subdermal contraceptive device 05/04/2013  . Hair loss 04/16/2013    Confidentiality was discussed with the patient and if applicable, with caregiver as well.  Patient's personal or confidential phone number: [phone]  The following portions of the patient's history were reviewed and updated as appropriate: allergies, current medications, past family history, past medical history, past social history, past surgical history and problem list.  Physical Exam:  Filed Vitals:   05/02/16 0911  BP: 112/74  Pulse: 100  Height: 5' 3.78" (1.62 m)  Weight: 189 lb (85.73 kg)   BP 112/74 mmHg  Pulse 100  Ht 5' 3.78" (1.62 m)  Wt 189 lb (85.73 kg)  BMI 32.67 kg/m2  LMP 04/21/2016 Body mass index: body mass index is 32.67 kg/(m^2). Facility age limit for growth percentiles is 20 years.  Physical Exam  Genitourinary:    There is tenderness (at both locations - note area 1 and 2 ) and lesion on the left labia. There is no rash on the left labia. No bleeding in the vagina. No vaginal discharge found.  Assessment/Plan: 1. Labial lesion -will test for HSV; lidocaine gel given for discomfort.  -shaving/hygeine precautions given   - lidocaine (XYLOCAINE) 5 % ointment; Apply 1 application topically as needed.  Dispense: 35.44 g; Refill: 0 - Herpes simplex virus culture   Follow-up:  Return if symptoms worsen or fail to improve.   Medical decision-making:  > 15 minutes spent, more than 50% of appointment was spent discussing diagnosis and management of symptoms

## 2016-05-04 LAB — HERPES SIMPLEX VIRUS CULTURE: Organism ID, Bacteria: NOT DETECTED

## 2016-05-05 ENCOUNTER — Telehealth: Payer: Self-pay | Admitting: *Deleted

## 2016-05-05 NOTE — Telephone Encounter (Signed)
-----   Message from Dierdre Harness, NP sent at [phone]  3:56 PM EDT ----- Negative HSV - likely irritation from shaving. Avoid shaving if possible. Advise if other lesions present or any other symptoms.

## 2016-05-05 NOTE — Telephone Encounter (Signed)
TC to pt. Updated that negative HSV - likely irritation from shaving. Pt verbalized understanding.

## 2016-05-12 ENCOUNTER — Encounter: Payer: Self-pay | Admitting: Pediatrics

## 2016-06-20 ENCOUNTER — Encounter: Payer: Self-pay | Admitting: Pediatrics

## 2016-06-20 ENCOUNTER — Other Ambulatory Visit: Payer: Self-pay | Admitting: Pediatrics

## 2016-06-20 ENCOUNTER — Ambulatory Visit (INDEPENDENT_AMBULATORY_CARE_PROVIDER_SITE_OTHER): Payer: BLUE CROSS/BLUE SHIELD | Admitting: Pediatrics

## 2016-06-20 VITALS — Temp 97.6°F | Wt 195.4 lb

## 2016-06-20 DIAGNOSIS — B9789 Other viral agents as the cause of diseases classified elsewhere: Secondary | ICD-10-CM

## 2016-06-20 DIAGNOSIS — J029 Acute pharyngitis, unspecified: Secondary | ICD-10-CM | POA: Diagnosis not present

## 2016-06-20 DIAGNOSIS — Z113 Encounter for screening for infections with a predominantly sexual mode of transmission: Secondary | ICD-10-CM

## 2016-06-20 DIAGNOSIS — J028 Acute pharyngitis due to other specified organisms: Principal | ICD-10-CM

## 2016-06-20 LAB — POCT RAPID HIV: Rapid HIV, POC: NEGATIVE

## 2016-06-20 NOTE — Progress Notes (Signed)
    Subjective:  Christy Barnes is a 20 y.o. female who presents today with a chief complaint of sore throat.   HPI:  Sore Throat Symptoms started 3 days ago with pain in her throat. Overall pain is about the same over the past 3 days. She has not taken any tylenol or ibuprofen. Last night it was hard for her to sleep due to the pain. Has some associated nasal congestion. No cough or sneeze. No sick contacts.  Screening for STD  Patient also wants STD testing today. She has been in a monogamous relationship over the past 4 months. No vaginal discharge. No fevers or chills.  ROS: Per HPI  Objective:  Physical Exam: Temp 97.6 F (36.4 C)   Wt 195 lb 6.4 oz (88.6 kg)   BMI 33.77 kg/m   Gen: NAD, resting comfortably HEENT: O/P mildly erythematous without purulence or drainage. TMs clear bilaterally. No LAD noted.  CV: RRR with no murmurs appreciated Pulm: NWOB, CTAB with no crackles, wheezes, or rhonchi MSK: no edema, cyanosis, or clubbing noted Skin: warm, dry Neuro: grossly normal, moves all extremities Psych: Normal affect and thought content  Assessment/Plan:  Sore Throat Likely viral URI. No signs of bacterial infection. Centor score of 1. Treat symptomatically with tylenol and/or ibuprofen. Return precautions reviewed. Instructed patient to follow up if symptoms worsening or not improving in 1 week.  Screening for STD Rapid HIV negative today. Urine GC/CT and throat GC/CT sent.  Algis Greenhouse. Jerline Pain, [place] Resident PGY-3 06/20/2016 9:52 AM

## 2016-06-20 NOTE — Patient Instructions (Signed)

## 2016-06-21 LAB — GC/CHLAMYDIA PROBE AMP
CT Probe RNA: NOT DETECTED
GC Probe RNA: NOT DETECTED

## 2016-06-22 DIAGNOSIS — Z113 Encounter for screening for infections with a predominantly sexual mode of transmission: Secondary | ICD-10-CM | POA: Diagnosis not present

## 2016-06-24 LAB — GC/CHLAMYDIA PROBE AMP
CT Probe RNA: NOT DETECTED
GC Probe RNA: NOT DETECTED

## 2016-06-27 ENCOUNTER — Telehealth: Payer: Self-pay | Admitting: Pediatrics

## 2016-06-27 ENCOUNTER — Ambulatory Visit: Payer: Self-pay

## 2016-06-27 NOTE — Telephone Encounter (Addendum)
Called patient back. Pt still having symptoms of sore throat and lymphadenitis per patient. Last note said to return if symptomatic for more than a week. Advised patient to come in for same day appointment for repeat assessment. Same day appointment made for 06/27/16 at 3:30 pm.

## 2016-06-27 NOTE — Telephone Encounter (Signed)
Please call patient back with lab result.  Her ph number is [phone].

## 2016-06-28 ENCOUNTER — Ambulatory Visit (INDEPENDENT_AMBULATORY_CARE_PROVIDER_SITE_OTHER): Payer: BLUE CROSS/BLUE SHIELD | Admitting: Pediatrics

## 2016-06-28 VITALS — Temp 97.4°F | Wt 193.6 lb

## 2016-06-28 DIAGNOSIS — L049 Acute lymphadenitis, unspecified: Secondary | ICD-10-CM | POA: Diagnosis not present

## 2016-06-28 LAB — GC/CHLAMYDIA PROBE, AMP (THROAT)
Chlamydia trachomatis RNA: NOT DETECTED
Neisseria gonorrhoeae RNA: NOT DETECTED

## 2016-06-28 NOTE — Progress Notes (Signed)
History was provided by the patient.  Christy Barnes is a 20 y.o. female presents    Chief Complaint  Patient presents with  . Sore Throat   Was seen here last week( August 1st) for sore throat and diagnosed with viral pharyngitis.  She states that at that time the pain was more so from swollowiing then but now it has moved to the neck area 2 days ago. Last week the pain was sharp but now it feels like "bruising pain". No fevers. No cold like symptoms.  No pains anywhere else.  Motrin helps.      The following portions of the patient's history were reviewed and updated as appropriate: allergies, current medications, past family history, past medical history, past social history, past surgical history and problem list.  Review of Systems  Constitutional: Negative for fever and weight loss.  HENT: Positive for sore throat. Negative for congestion, ear discharge and ear pain.   Eyes: Negative for pain, discharge and redness.  Respiratory: Negative for cough and shortness of breath.   Cardiovascular: Negative for chest pain.  Gastrointestinal: Negative for diarrhea and vomiting.  Genitourinary: Negative for frequency and hematuria.  Musculoskeletal: Negative for back pain, falls and neck pain.  Skin: Negative for rash.  Neurological: Negative for speech change, loss of consciousness, weakness and headaches.  Endo/Heme/Allergies: Does not bruise/bleed easily.  Psychiatric/Behavioral: The patient does not have insomnia.      Physical Exam:  Temp 97.4 F (36.3 C) (Temporal)   Wt 193 lb 9.6 oz (87.8 kg)   BMI 33.46 kg/m   Growth percentile SmartLinks can only be used for patients less than 7 years old. HR: 80  General:   alert, cooperative, appears stated age and no distress  Oral cavity:   lips, mucosa, and tongue normal; teeth and gums normal, has TMJ. NO erythema or petechiae noted in throat    Eyes:   sclerae white  Ears:   normal bilaterally  Nose: clear, no discharge, no  nasal flaring  Neck:  Neck appearance: Normal, left cervical region had some tender and enlarged lymph nodes. Normal range of motion and no edema appreciated.    Lungs:  clear to auscultation bilaterally  Heart:   regular rate and rhythm, S1, S2 normal, no murmur, click, rub or gallop   Neuro:  normal without focal findings     Assessment/Plan: 1. Lymphadenitis, acute Since she is having mild symptoms, there's no redness, no fevers and she has normal range of motion we will monitor for now.  If it doesn't improve over the next 2 weeks or if it gets worse then we will consider treating.      Cherece Mcneil Sober, MD  06/28/16

## 2016-06-30 ENCOUNTER — Encounter: Payer: Self-pay | Admitting: Pediatrics

## 2016-07-20 DIAGNOSIS — T7840XA Allergy, unspecified, initial encounter: Secondary | ICD-10-CM | POA: Diagnosis not present

## 2016-07-20 DIAGNOSIS — J069 Acute upper respiratory infection, unspecified: Secondary | ICD-10-CM | POA: Diagnosis not present

## 2016-07-20 DIAGNOSIS — J029 Acute pharyngitis, unspecified: Secondary | ICD-10-CM | POA: Diagnosis not present

## 2016-07-20 DIAGNOSIS — L500 Allergic urticaria: Secondary | ICD-10-CM | POA: Diagnosis not present

## 2016-07-25 ENCOUNTER — Encounter: Payer: Self-pay | Admitting: Pediatrics

## 2016-07-25 ENCOUNTER — Ambulatory Visit (INDEPENDENT_AMBULATORY_CARE_PROVIDER_SITE_OTHER): Payer: BLUE CROSS/BLUE SHIELD | Admitting: Pediatrics

## 2016-07-25 VITALS — Temp 98.3°F | Wt 199.6 lb

## 2016-07-25 DIAGNOSIS — T7840XD Allergy, unspecified, subsequent encounter: Secondary | ICD-10-CM

## 2016-07-25 DIAGNOSIS — B2790 Infectious mononucleosis, unspecified without complication: Secondary | ICD-10-CM

## 2016-07-25 DIAGNOSIS — I889 Nonspecific lymphadenitis, unspecified: Secondary | ICD-10-CM

## 2016-07-25 DIAGNOSIS — J029 Acute pharyngitis, unspecified: Secondary | ICD-10-CM | POA: Diagnosis not present

## 2016-07-25 DIAGNOSIS — J020 Streptococcal pharyngitis: Secondary | ICD-10-CM

## 2016-07-25 LAB — POCT MONO (EPSTEIN BARR VIRUS): Mono, POC: POSITIVE — AB

## 2016-07-25 LAB — POCT RAPID STREP A (OFFICE): Rapid Strep A Screen: POSITIVE — AB

## 2016-07-25 MED ORDER — CEPHALEXIN 500 MG PO CAPS: 500.0000 mg | ORAL_CAPSULE | Freq: Two times a day (BID) | ORAL | 0 refills | Status: AC

## 2016-07-25 MED ORDER — EPINEPHRINE 0.3 MG/0.3ML IJ SOAJ: 0.3000 mg | Freq: Once | INTRAMUSCULAR | 0 refills | Status: AC

## 2016-07-25 NOTE — Progress Notes (Signed)
History was provided by the patient.  Christy Barnes is a 20 y.o. female who is here for allergic reaction, sore throat, neck pain, malaise.     HPI: Christy Barnes is a 20 y/o F with pertinent PMH of allergic rhinitis, food allergies who presents today for sore throat, neck pain, headache and malaise as well as recent hives. Per patient she felt nauseas Wednesday (about 6 days ago) but went to the mall with her cousin and ate a cheese steak, fries and peach lemonade (all that she has eaten before). She did not have vomiting but continued to not feel well. Then Wed night she was itching all over. She remembered one small bump on her left eyelid that did not itch or swell but thought could have been a bug bite. On Thursday morning she had left hand, then face swelling with hives all over. She denies shortness of breath, throat swelling. She continued with mild GI upset. She went to [facility] and was given IM Benadryl and a steroid. She was also discharged with PO prednisone 40 mg x3 days she took Fri-Sun. She reports continued requiring Benadryl increased from every 6 to every 4 hours for next several days. Just yesterday the hives went way. She denies illness prior, maybe mild congestion. She had no known exposures to allergens, no new foods. She used a new soap for the past 1 month which she stopped in case. No sick contacts. No tick exposure. No known fever.  Yesterday and today she feels worse with sore throat, neck pain, swollen lymph node, headache and body aches today. She has had some congestion prior, but no cough. She has not been taking prescribed daily allergy medication. She reports history of spitting up mucus as primarily allergic symptoms prior. Christy Barnes was seen about 1 month ago for similar sore throat/neck pain and swollen lymph nodes which she reports all improved since then prior to return today. She was tested for STI then and denies new partners.    Patient Active Problem List   Diagnosis Date Noted  . Epistaxis 11/24/2015  . Vulvitis 06/29/2014  . Gastroesophageal reflux 06/29/2014  . Allergic rhinitis 05/08/2014  . Obesity (BMI 30-39.9) 11/04/2013  . Acne 11/04/2013  . Presence of subdermal contraceptive device 05/04/2013  . Hair loss 04/16/2013    Current Outpatient Prescriptions on File Prior to Visit  Medication Sig Dispense Refill  . cetirizine (ZYRTEC) 10 MG tablet Take 1 tablet (10 mg total) by mouth daily. (Patient not taking: Reported on 06/20/2016) 30 tablet 2  . fluticasone (FLONASE) 50 MCG/ACT nasal spray Place 1 spray into both nostrils 2 (two) times daily. (Patient not taking: Reported on 06/20/2016) 16 g 12  . lidocaine (XYLOCAINE) 5 % ointment Apply 1 application topically as needed. (Patient not taking: Reported on 06/20/2016) 35.44 g 0  . oxymetazoline (12 HOUR NASAL SPRAY) 0.05 % nasal spray     . [DISCONTINUED] desloratadine (CLARINEX) 5 MG tablet Take 1 tablet (5 mg total) by mouth daily. 30 tablet 5   No current facility-administered medications on file prior to visit.     The following portions of the patient's history were reviewed and updated as appropriate: allergies, current medications, past family history, past medical history, past social history, past surgical history and problem list.  Physical Exam:    Vitals:   07/25/16 1016  Temp: 98.3 F (36.8 C)  Weight: 199 lb 9.6 oz (90.5 kg)   Growth parameters are noted and are not appropriate  for age. Growth percentile SmartLinks can only be used for patients less than 21 years old. No LMP recorded. Patient has had an implant.    General:   alert, cooperative, appears stated age and no distress  Gait:   normal  Skin:   normal, no urticaria, few facial acne lesions  Oral cavity:   lips, mucosa, and tongue normal; teeth and gums normal and 3+ tonsilar hypertrophy without exudate  Eyes:   sclerae white, pupils equal and reactive  Ears:   normal bilaterally  Neck:   supple,  symmetrical, trachea midline and +left sided 1.5 cm lower posterior cervical lymph node, mobile and tender, and <1 cm upper posterior cervical LN, mobile and tender  Lungs:  clear to auscultation bilaterally  Heart:   regular rate and rhythm, S1, S2 normal, no murmur, click, rub or gallop  Abdomen:  soft, non-tender; bowel sounds normal; no masses,  no organomegaly  GU:  not examined  Extremities:   extremities normal, atraumatic, no cyanosis or edema  Neuro:  normal without focal findings, mental status, speech normal, alert and oriented x3, PERLA and gait and station normal      Assessment/Plan:  Christy Barnes was seen today for rash, urticaria, sore throat, nasal congestion, neck pain and generalized body aches. Her initial episode is consistent with urticarial allergic reaction and with nausea raised concern for anaphylaxis though has recovered with antihistamine and steroid treatment and did not require epinephrine. Given shellfish and peanut allergies, will prescribe epi pen today and refer to Allergy for testing as well with possible new allergen. It is also possible that her rash was a viral urticaria associated with current symptoms that are consistent with viral syndrome vs mononucleosis given pharyngitis and posterior lymphadenopathy, vs strep pharyngitis without significant coryza and associated GI complaint and headache. Steroid prescription may have masked fever which she has not had and would be expected. Will repeat HIV in caution of possible negative test during acute illness 1 month ago, would expect positive now.   Diagnoses and all orders for this visit:  Pharyngitis/lymphadenopathy and malaise:  -     POCT Mono (Epstein Barr Virus)- positive; given handout with precautions and school note -     HIV antibody (with reflex) -     POCT rapid strep A- positive; Rx: cephALEXin (KEFLEX) 500 MG capsule; Take 1 capsule (500 mg total) by mouth 2 (two) times daily.  Allergic reaction,  subsequent encounter -     Ambulatory referral to Allergy -     EPINEPHrine 0.3 mg/0.3 mL IJ SOAJ injection; Inject 0.3 mLs (0.3 mg total) into the muscle once   - Follow-up visit in 1 mo for resolution of lymphadenopathy

## 2016-07-25 NOTE — Progress Notes (Signed)
I personally saw and evaluated the patient, and participated in the management and treatment plan as documented in the resident's note.  Earl Many 07/25/2016 4:46 PM

## 2016-07-25 NOTE — Progress Notes (Signed)
Been taking Benedryl and Prednisone that was prescribed by Urgent Care on 8/31/7.

## 2016-07-25 NOTE — Patient Instructions (Addendum)
It was a pleasure seeing you in clinic today! We discussed your recent rash, nausea, and swollen lymph nodes.  1. Mono -your test was positive -the infection is viral, so there are no antibiotics to take, just manage your symptoms (see attached handout for details)  2. Strep throat -your rapid strep test also came back positive -we will send in a prescription for antibiotics electronically and you can go pick them up  3. Allergies -we filled a prescription for an Epi-pen electronically (can pick it up whenever, unfortunately can be expensive) -referral to allergy for further work up  Return to clinic in one month to follow up on the swollen lymph nodes and resolution of symptoms. Come in sooner if persistence or worsening of symptoms.   Infectious Mononucleosis Infectious mononucleosis is an infection caused by a virus. This illness is often called "mono." It causes symptoms that affect various areas of the body, including the throat, upper air passages, and lymph glands. The liver or spleen may also be affected. The virus spreads from person to person through close contact. The illness is usually not serious and often goes away in 2-4 weeks without treatment. In rare cases, symptoms can be more severe and last longer, sometimes up to several months. Because the illness can sometimes cause the liver or spleen to become enlarged, you should not participate in contact sports or strenuous exercise until your health care provider approves. CAUSES  Infectious mononucleosis is caused by the Epstein-Barr virus. This virus spreads through contact with an infected person's saliva or other bodily fluids. It is often spread through kissing. It may also spread through coughing or sharing utensils or drinking glasses that were recently used by an infected person. An infected person will not always appear ill but can still spread the virus. RISK FACTORS This illness is most common in adolescents and young  adults. SIGNS AND SYMPTOMS  The most common symptoms of infectious mononucleosis are:  Sore throat.   Headache.   Fatigue.   Muscle aches.   Swollen glands.   Fever.   Poor appetite.   Enlarged liver or spleen.  Some less common symptoms that can also occur include:  Rash. This is more common if you take antibiotic medicines.  Feeling sick to your stomach (nauseous).   Abdominal pain.  DIAGNOSIS  Your health care provider will take your medical history and do a physical exam. Blood tests can be done to confirm the diagnosis.  TREATMENT  Infectious mononucleosis usually goes away on its own with time. It cannot be cured with medicines, but medicines are sometimes used to relieve symptoms. Steroid medicine is sometimes needed if the swelling in the throat causes breathing or swallowing problems. Treatment in a hospital is sometimes needed for severe cases.  HOME CARE INSTRUCTIONS   Rest as needed.   Do not participate in contact sports, strenuous exercise, or heavy lifting until your health care provider approves. The liver and spleen could be seriously injured if they are enlarged from the illness. You may need to wait a couple months before participating in sports.   Drink enough fluid to keep your urine clear or pale yellow.   Do not drink alcohol.  Take medicines only as directed by your health care provider. Children under 79 years of age should not take aspirin because of the association with Reye syndrome.   Eat soft foods. Cold foods such as ice cream or frozen ice pops can soothe a sore throat.  If you  have a sore throat, gargle with a mixture of salt and water. This may help relieve your discomfort. Mix 1 tsp of salt in 1 cup of warm water. Sucking on hard candy may also help.   Start regular activities gradually after the fever is gone. Be sure to rest when tired.   Avoid kissing or sharing utensils or drinking glasses until your health care  provider tells you that you are no longer contagious.  PREVENTION  To avoid spreading the virus, do not kiss anyone or share utensils, drinking glasses, or food until your health care provider tells you that you are no longer contagious. SEEK MEDICAL CARE IF:   Your fever is not gone after 10 days.  You have swollen lymph nodes that are not back to normal after 4 weeks.  Your activity level is not back to normal after 2 months.   You have yellow coloring to your eyes and skin (jaundice).  You have constipation.  SEEK IMMEDIATE MEDICAL CARE IF:   You have severe pain in the abdomen or shoulder.  You are drooling.  You have trouble swallowing.  You have trouble breathing.  You develop a stiff neck.  You develop a severe headache.  You cannot stop throwing up (vomiting).  You have convulsions.  You are confused.  You have trouble with balance.  You have signs of dehydration. These may include:  Weakness.  Sunken eyes.  Pale skin.  Dry mouth.  Rapid breathing or pulse.   This information is not intended to replace advice given to you by your health care provider. Make sure you discuss any questions you have with your health care provider.   Document Released: 11/03/2000 Document Revised: 11/27/2014 Document Reviewed: 07/14/2014 Elsevier Interactive Patient Education [employer].

## 2016-07-26 LAB — HIV ANTIBODY (ROUTINE TESTING W REFLEX): HIV 1&2 Ab, 4th Generation: NONREACTIVE

## 2016-07-28 ENCOUNTER — Encounter: Payer: Self-pay | Admitting: Pediatrics

## 2016-07-28 ENCOUNTER — Ambulatory Visit (INDEPENDENT_AMBULATORY_CARE_PROVIDER_SITE_OTHER): Payer: BLUE CROSS/BLUE SHIELD | Admitting: Pediatrics

## 2016-07-28 VITALS — Temp 100.0°F | Wt 192.4 lb

## 2016-07-28 DIAGNOSIS — J301 Allergic rhinitis due to pollen: Secondary | ICD-10-CM | POA: Diagnosis not present

## 2016-07-28 DIAGNOSIS — J020 Streptococcal pharyngitis: Secondary | ICD-10-CM

## 2016-07-28 DIAGNOSIS — B2790 Infectious mononucleosis, unspecified without complication: Secondary | ICD-10-CM

## 2016-07-28 MED ORDER — DEXAMETHASONE SODIUM PHOSPHATE 10 MG/ML IJ SOLN
10.0000 mg | Freq: Once | INTRAMUSCULAR | Status: AC
  Administered 2016-07-28: 10 mg via INTRAMUSCULAR

## 2016-07-28 MED ORDER — IBUPROFEN 800 MG PO TABS: 800.0000 mg | ORAL_TABLET | Freq: Three times a day (TID) | ORAL | 0 refills | Status: DC | PRN

## 2016-07-28 MED ORDER — CETIRIZINE HCL 10 MG PO TABS: 10.0000 mg | ORAL_TABLET | Freq: Every day | ORAL | 2 refills | Status: DC

## 2016-07-28 NOTE — Progress Notes (Signed)
I personally saw and evaluated the patient, and participated in the management and treatment plan as documented in the resident's note.  Earl Many 07/28/2016 7:36 PM

## 2016-07-28 NOTE — Patient Instructions (Addendum)
Epstein-Barr virus (EBV). How mono is spread The Epstein-Barr virus (EBV) can be found in saliva and mucus (and sometimes tears). EBV is not spread by casual contact. You can live in the same house with a person who has mono and never become infected with the virus. But a person who has a weakened immune system may be at higher risk for mono. It's possible that people who have had mono can spread the virus even though they no longer have symptoms.   Not everyone infected with the virus that causes mono (Epstein-Barr virus, or EBV) has symptoms. This is especially true for young children, who may have a fever but no other symptoms. People ages 6 to 75 are most likely to have obvious symptoms.1 The most common symptoms of mono are: Fever, which may range from 101F (38.3C) to 104F (40C), and chills.  Sore throat, often with white patches on the tonsils (which may look like strep throat).  Swollen lymph nodes all over the body, especially the lymph nodes in the neck .  Swollen tonsils.  Headache or body aches.  Fatigue and a lack of energy.  Loss of appetite.  Pain in the upper left part of the abdomen, which may mean that the spleen has become enlarged. These symptoms usually get better in about 1 or 2 months.   Usually no treatment for mono is needed other than self-care at home. Most people recover from mono after several weeks. But for some, it may take several months before they regain their normal energy levels. Self-care is usually all that is needed if you have mono. No medicine or treatment will speed your recovery. Most people who have mono recover without problems. There are many steps you can take to ease the symptoms until you are back to normal. There are no specific medicines used to treat mono. Over-the-counter medicines may be used to help treat the symptoms of mono.  Medicine choices Over-the-counter pain relievers, including acetaminophen (such as Tylenol) and ibuprofen  (such as Advil), may be used to relieve headaches and a sore throat. In severe cases, corticosteroids may be used to reduce swelling of the throat, tonsils, or spleen.

## 2016-07-28 NOTE — Progress Notes (Addendum)
History was provided by the patient.  Christy Barnes is a 20 y.o. female who is here for sore throat, headache, itching.  HPI:   Christy Barnes seen 4 days ago and diagnosed with mononucleosis who presents for concern regarding continued headache, sore throat, decreased appetite, itching skin, fatigue. Rapid strep and monospot positive. HIV nonreactive in clinic on 9/5. Taking keflex on day 4/7. Using benadryl and ibuprofen for pain and itching.  No abdominal pain.  Physical Exam:  Temp 100 F (37.8 C) (Temporal)   Wt 192 lb 6.4 oz (87.3 kg)   BMI 33.25 kg/m   General:   cooperative, fatigued and mild distress     Skin:   normal  Oral cavity:   3+ tonsillar hypertrophy with white exudates  Eyes:   sclerae white, pupils equal and reactive  Ears:   normal bilaterally  Nose: clear, no discharge  Neck:  Cervical lymphadenopathy, tender  Lungs:  clear to auscultation bilaterally  Heart:   regular rate and rhythm, S1, S2 normal, no murmur, click, rub or gallop   Abdomen:  soft, non-tender; bowel sounds normal; no masses,  no organomegaly  GU:  not examined  Extremities:   extremities normal, atraumatic, no cyanosis or edema  Neuro:  normal without focal findings and mental status, speech normal, alert and oriented x3    Assessment/Plan: Christy Barnes is a Christy Barnes with mononucleosis with positive strep and monospot test who presents with continued sore throat,  generalized body aches.  Negative HIV 4 days ago.   Mononucleosis Decadron x 1 IM given in the clinic Cetirizine prescription for itching Benadryl for itching at night Ibuprofen for fever, pain Supportive care explained in detail  Education provided about mono and course typically lasting 1-2 months Precautions explained about not participating in contact sports or activity for 4 weeks, do not share drinks or food  Strep pharyngitis Continue cephALEXin (KEFLEX) 500 MG capsule; Take 1 capsule (500 mg total) by mouth 2 (two)  times daily.   Alonza Smoker, MD 07/28/16

## 2016-10-24 DIAGNOSIS — N760 Acute vaginitis: Secondary | ICD-10-CM | POA: Diagnosis not present

## 2016-11-06 DIAGNOSIS — N644 Mastodynia: Secondary | ICD-10-CM | POA: Diagnosis not present

## 2016-11-06 DIAGNOSIS — R079 Chest pain, unspecified: Secondary | ICD-10-CM | POA: Diagnosis not present

## 2016-12-15 DIAGNOSIS — Z0000 Encounter for general adult medical examination without abnormal findings: Secondary | ICD-10-CM | POA: Diagnosis not present

## 2016-12-15 DIAGNOSIS — Z862 Personal history of diseases of the blood and blood-forming organs and certain disorders involving the immune mechanism: Secondary | ICD-10-CM | POA: Diagnosis not present

## 2016-12-15 DIAGNOSIS — Z13220 Encounter for screening for lipoid disorders: Secondary | ICD-10-CM | POA: Diagnosis not present

## 2016-12-15 DIAGNOSIS — Z23 Encounter for immunization: Secondary | ICD-10-CM | POA: Diagnosis not present

## 2016-12-15 DIAGNOSIS — Z113 Encounter for screening for infections with a predominantly sexual mode of transmission: Secondary | ICD-10-CM | POA: Diagnosis not present

## 2016-12-15 DIAGNOSIS — N644 Mastodynia: Secondary | ICD-10-CM | POA: Diagnosis not present

## 2017-02-22 DIAGNOSIS — Z113 Encounter for screening for infections with a predominantly sexual mode of transmission: Secondary | ICD-10-CM | POA: Diagnosis not present

## 2017-05-14 ENCOUNTER — Encounter (HOSPITAL_COMMUNITY): Payer: Self-pay | Admitting: Emergency Medicine

## 2017-05-14 ENCOUNTER — Ambulatory Visit (HOSPITAL_COMMUNITY)
Admission: EM | Admit: 2017-05-14 | Discharge: 2017-05-14 | Disposition: A | Discharge status: Other Acute Inpatient Hospital | Payer: Medicaid Other

## 2017-05-14 ENCOUNTER — Ambulatory Visit (HOSPITAL_COMMUNITY)
Admission: EM | Admit: 2017-05-14 | Discharge: 2017-05-14 | Disposition: A | Discharge status: Home / Self Care | Payer: BLUE CROSS/BLUE SHIELD | Attending: Family Medicine | Admitting: Family Medicine

## 2017-05-14 DIAGNOSIS — Z91013 Allergy to seafood: Secondary | ICD-10-CM | POA: Diagnosis not present

## 2017-05-14 DIAGNOSIS — B9689 Other specified bacterial agents as the cause of diseases classified elsewhere: Secondary | ICD-10-CM | POA: Diagnosis not present

## 2017-05-14 DIAGNOSIS — N760 Acute vaginitis: Secondary | ICD-10-CM | POA: Diagnosis not present

## 2017-05-14 DIAGNOSIS — Z79899 Other long term (current) drug therapy: Secondary | ICD-10-CM | POA: Diagnosis not present

## 2017-05-14 DIAGNOSIS — Z8349 Family history of other endocrine, nutritional and metabolic diseases: Secondary | ICD-10-CM | POA: Insufficient documentation

## 2017-05-14 DIAGNOSIS — E669 Obesity, unspecified: Secondary | ICD-10-CM | POA: Diagnosis not present

## 2017-05-14 DIAGNOSIS — Z888 Allergy status to other drugs, medicaments and biological substances status: Secondary | ICD-10-CM | POA: Diagnosis not present

## 2017-05-14 DIAGNOSIS — K130 Diseases of lips: Secondary | ICD-10-CM | POA: Diagnosis not present

## 2017-05-14 MED ORDER — METRONIDAZOLE 500 MG PO TABS: 500.0000 mg | ORAL_TABLET | Freq: Two times a day (BID) | ORAL | 0 refills | Status: DC

## 2017-05-14 NOTE — ED Triage Notes (Signed)
Pt here for vag itching onset 1 week  Denies abd/back pain, fevers, chills, vag d/c, urinary sx  Sexually active w/condom use  A&O x4... NAD.[facility]

## 2017-05-14 NOTE — Discharge Instructions (Signed)
Take the Flagyl as directed. The swab results will be returned and approximately 1-2 days. If there are any infections that were not treated with this prescription he can generally be treated over the telephone. You will be called with those results. Obtain a primary care provider as soon as you can. You may also need to see a dermatologist in reference to the spot on her upper lip.

## 2017-05-14 NOTE — ED Provider Notes (Signed)
CSN: [id number]     Arrival date & time 05/14/17  1344 History   First MD Initiated Contact with Patient 05/14/17 1424     Chief Complaint  Patient presents with  . Vaginal Itching   (Consider location/radiation/quality/duration/timing/severity/associated sxs/prior Treatment) 21 year old female complaining of a vaginal irritation for about one week. She is somewhat vague in description of her discomfort stating that sometimes it feels like it might be itching and then again it does not itch, sometimes it feels like something is up in her vagina and other times it does not, she feels like there something different about her clitoris, stating that sometimes it tingles and feels weird sometimes it does not. Occasionally she feels like something is up in her vagina She denies vaginal discharge, pelvic pain or odor.  Patient also has a concern of a lesion to the upper right vermilion border that has been there for a year. His is the size of the head of the PND or approximately 1 mm or less.      Past Medical History:  Diagnosis Date  . Labial tear freshman year  . Overweight and obesity(278.0)    Past Surgical History:  Procedure Laterality Date  . labia repair  freshman year   Family History  Problem Relation Age of Onset  . Thyroid disease Mother    Social History  Substance Use Topics  . Smoking status: Never Smoker  . Smokeless tobacco: Never Used  . Alcohol use No   OB History    No data available     Review of Systems  Constitutional: Negative.  Negative for chills and fever.  HENT: Negative for ear pain.   Eyes: Negative for pain and visual disturbance.  Gastrointestinal: Negative for abdominal pain and vomiting.  Genitourinary: Negative for dysuria, hematuria, pelvic pain and vaginal discharge.  Musculoskeletal: Negative for back pain.  Skin: Negative for color change and rash.  Neurological: Negative.   All other systems reviewed and are negative.   Allergies   Fish allergy; Peanut-containing drug products; and Shrimp [shellfish allergy]  Home Medications   Prior to Admission medications   Medication Sig Start Date End Date Taking? Authorizing Provider  cetirizine (ZYRTEC) 10 MG tablet Take 1 tablet (10 mg total) by mouth daily. 07/28/16   Alonza Smoker, MD  EPINEPHrine 0.3 mg/0.3 mL IJ SOAJ injection  07/25/16   [provider]  ibuprofen (ADVIL,MOTRIN) 800 MG tablet Take 1 tablet (800 mg total) by mouth every 8 (eight) hours as needed. 07/28/16   Alonza Smoker, MD  metroNIDAZOLE (FLAGYL) 500 MG tablet Take 1 tablet (500 mg total) by mouth 2 (two) times daily. X 7 days 05/14/17   Janne Napoleon, NP  oxymetazoline (12 HOUR NASAL SPRAY) 0.05 % nasal spray     [provider]   Meds Ordered and Administered this Visit  Medications - No data to display  BP 103/67 (BP Location: Left Arm)   Pulse 83   Temp 98.7 F (37.1 C) (Oral)   Resp 16   LMP 05/07/2017   SpO2 100%  No data found.   Physical Exam  Constitutional: She is oriented to person, place, and time. She appears well-developed and well-nourished. No distress.  HENT:  The lesion to the vermilion border of the right upper lip is examined under light and magnification. It appears to be a very small pedunculated raised lesion of flesh-colored. There is no drainage. Well marginated no signs of infection, no bleeding.  Neck: Normal  range of motion. Neck supple.  Cardiovascular: Normal rate.   Pulmonary/Chest: Effort normal. No respiratory distress.  Abdominal: Soft. There is no tenderness.  Genitourinary:  Genitourinary Comments: Normal external female genitalia. There is scant amount of thin gray discharge at the vaginal orifice. The vaginal walls are coated with the scant amount of same discharge and there is a small to moderate amount of thin gray discharge surrounding the cervix. The entire cervix and ectocervix is erythematous. 2-3 nabothian cyst. No other abnormal lesions  are seen. No tenderness to the cervix. Is pliable without pain. Bimanual: No CMT or adnexal tenderness. No other masses palpated. No foreign bodies are seen within the vaginal vault or adjacent to the cervix. The external genitalia was again examined. There is no tenderness or itching to the outer or inner labia. Since the patient had complained of "a weird sensation of the clitoris" it was examined and found to be of normal color, structure and without lesions. No other external abnormalities were seen. And no tenderness to the soft tissues. Cheral Bay, RN present  Musculoskeletal: Normal range of motion. She exhibits no edema.  Neurological: She is alert and oriented to person, place, and time. No cranial nerve deficit. She exhibits normal muscle tone.  Skin: Skin is warm and dry.  Psychiatric: She has a normal mood and affect.  Nursing note and vitals reviewed.   Urgent Care Course     Procedures (including critical care time)  Labs Review Labs Reviewed  CERVICOVAGINAL ANCILLARY ONLY    Imaging Review No results found.   Visual Acuity Review  Right Eye Distance:   Left Eye Distance:   Bilateral Distance:    Right Eye Near:   Left Eye Near:    Bilateral Near:         MDM   1. Lesion of lip   2. BV (bacterial vaginosis)   3. Vaginitis and vulvovaginitis    Take the Flagyl as directed. The swab results will be returned and approximately 1-2 days. If there are any infections that were not treated with this prescription he can generally be treated over the telephone. You will be called with those results. Obtain a primary care provider as soon as you can. You may also need to see a dermatologist in reference to the spot on her upper lip. Meds ordered this encounter  Medications  . metroNIDAZOLE (FLAGYL) 500 MG tablet    Sig: Take 1 tablet (500 mg total) by mouth 2 (two) times daily. X 7 days    Dispense:  14 tablet    Refill:  0    Order Specific Question:    Supervising Provider    Answer:   Robyn Haber [[id number]]       Janne Napoleon, NP 05/14/17 1457

## 2017-05-15 LAB — CERVICOVAGINAL ANCILLARY ONLY
Bacterial vaginitis: POSITIVE — AB
Candida vaginitis: NEGATIVE
Chlamydia: NEGATIVE
Neisseria Gonorrhea: NEGATIVE
Trichomonas: NEGATIVE

## 2017-05-22 ENCOUNTER — Ambulatory Visit (HOSPITAL_COMMUNITY)
Admission: EM | Admit: 2017-05-22 | Discharge: 2017-05-22 | Disposition: A | Discharge status: Home / Self Care | Payer: BLUE CROSS/BLUE SHIELD | Attending: Internal Medicine | Admitting: Internal Medicine

## 2017-05-22 ENCOUNTER — Encounter (HOSPITAL_COMMUNITY): Payer: Self-pay | Admitting: *Deleted

## 2017-05-22 DIAGNOSIS — J029 Acute pharyngitis, unspecified: Secondary | ICD-10-CM | POA: Diagnosis not present

## 2017-05-22 DIAGNOSIS — R0982 Postnasal drip: Secondary | ICD-10-CM

## 2017-05-22 DIAGNOSIS — B373 Candidiasis of vulva and vagina: Secondary | ICD-10-CM | POA: Diagnosis not present

## 2017-05-22 DIAGNOSIS — B3731 Acute candidiasis of vulva and vagina: Secondary | ICD-10-CM

## 2017-05-22 DIAGNOSIS — Z79899 Other long term (current) drug therapy: Secondary | ICD-10-CM | POA: Diagnosis not present

## 2017-05-22 DIAGNOSIS — N898 Other specified noninflammatory disorders of vagina: Secondary | ICD-10-CM | POA: Diagnosis not present

## 2017-05-22 MED ORDER — FLUCONAZOLE 150 MG PO TABS: ORAL_TABLET | ORAL | 0 refills | Status: DC

## 2017-05-22 NOTE — ED Provider Notes (Addendum)
CSN: [id number]     Arrival date & time 05/22/17  1400 History   First MD Initiated Contact with Patient 05/22/17 1433     Chief Complaint  Patient presents with  . Vaginal Discharge   (Consider location/radiation/quality/duration/timing/severity/associated sxs/prior Treatment) 21 year old female complaining of a vaginal irritation with a cottage cheese discharge started yesterday. She just completed a course of Flagyl for BV. 7 days ago she was tested for several STDs which were all negative with the exception of Gardnerella. She is also complaining of soreness to the left side of the throat.      Past Medical History:  Diagnosis Date  . Labial tear freshman year  . Overweight and obesity(278.0)    Past Surgical History:  Procedure Laterality Date  . labia repair  freshman year   Family History  Problem Relation Age of Onset  . Thyroid disease Mother    Social History  Substance Use Topics  . Smoking status: Never Smoker  . Smokeless tobacco: Never Used  . Alcohol use No   OB History    No data available     Review of Systems  Constitutional: Negative.  Negative for fever.  HENT: Positive for sore throat.   Respiratory: Negative.   Genitourinary: Positive for vaginal discharge. Negative for dysuria, genital sores, menstrual problem and pelvic pain.  Musculoskeletal: Negative.   All other systems reviewed and are negative.   Allergies  Fish allergy; Peanut-containing drug products; and Shrimp [shellfish allergy]  Home Medications   Prior to Admission medications   Medication Sig Start Date End Date Taking? Authorizing Provider  EPINEPHrine 0.3 mg/0.3 mL IJ SOAJ injection  07/25/16   [provider]  fluconazole (DIFLUCAN) 150 MG tablet 1 tab po x 1. May repeat in 72 hours if no improvement 05/22/17   Janne Napoleon, NP  ibuprofen (ADVIL,MOTRIN) 800 MG tablet Take 1 tablet (800 mg total) by mouth every 8 (eight) hours as needed. 07/28/16   Alonza Smoker, MD   oxymetazoline (12 HOUR NASAL SPRAY) 0.05 % nasal spray     [provider]   Meds Ordered and Administered this Visit  Medications - No data to display  BP 122/72 (BP Location: Right Arm)   Pulse 82   Temp 98.6 F (37 C) (Oral)   Resp 18   LMP 05/07/2017   SpO2 100%  No data found.   Physical Exam  Constitutional: She is oriented to person, place, and time. She appears well-developed and well-nourished. No distress.  Eyes: EOM are normal.  Neck: Normal range of motion. Neck supple.  Pulmonary/Chest: Effort normal.  Neurological: She is alert and oriented to person, place, and time.  Skin: Skin is warm and dry.  Psychiatric: She has a normal mood and affect.  Nursing note and vitals reviewed.   Urgent Care Course     Procedures (including critical care time)  Labs Review Labs Reviewed  CERVICOVAGINAL ANCILLARY ONLY    Imaging Review No results found.   Visual Acuity Review  Right Eye Distance:   Left Eye Distance:   Bilateral Distance:    Right Eye Near:   Left Eye Near:    Bilateral Near:         MDM   1. Vaginal discharge   2. Yeast vaginitis   3. PND (post-nasal drip)   4. Acute pharyngitis, unspecified etiology   Patient was able to collect her own swab for cyto testing. Your sore throat is likely due to sinus drainage  in the back of your throat. Take Zyrtec or Allegra to help minimize the drainage. Also drink plenty of water to help clear your throat and ibuprofen 600 mg every 6 hours for sore throat pain. Cepacol lozenges have a numbing effect to it and these are also helpful. You are being prescribed Diflucan to treat yeast infection. We have obtained the swab that also test for other STDs. If positive we can call and likely treat you over the telephone. Meds ordered this encounter  Medications  . fluconazole (DIFLUCAN) 150 MG tablet    Sig: 1 tab po x 1. May repeat in 72 hours if no improvement    Dispense:  2 tablet    Refill:  0     Order Specific Question:   Supervising Provider    Answer:   Sherlene Shams [[id number]]       Janne Napoleon, NP 05/22/17 1500    Janne Napoleon, NP 05/22/17 [id number]

## 2017-05-22 NOTE — Discharge Instructions (Signed)
Your sore throat is likely due to sinus drainage in the back of your throat. Take Zyrtec or Allegra to help minimize the drainage. Also drink plenty of water to help clear your throat and ibuprofen 600 mg every 6 hours for sore throat pain. Cepacol lozenges have a numbing effect to it and these are also helpful. You are being prescribed Diflucan to treat yeast infection. We have obtained the swab that also test for other STDs. If positive we can call and likely treat you over the telephone.

## 2017-05-22 NOTE — ED Triage Notes (Signed)
Pt    States    She  Was   Seen last   Week   For  BV     And  Has  A     Vaginal  Irritation     With  A  Whitish     Discharge       She  States  A  sorethroat   With  Pain   When  She   Swallows

## 2017-05-24 LAB — CERVICOVAGINAL ANCILLARY ONLY
Bacterial vaginitis: NEGATIVE
Candida vaginitis: POSITIVE — AB
Chlamydia: NEGATIVE
Neisseria Gonorrhea: NEGATIVE
Trichomonas: NEGATIVE

## 2017-06-06 NOTE — Progress Notes (Signed)
[facility] Phone: [phone]   Date of Visit: 06/07/2017   HPI:  Patient presents to establish care. She was last seen at [facility].  Concerns today: Knee Pain  PMH: Sickle Cell Trait Traction Allopecia Allergic Rhinitis  Nexplanon (04/2016)  Meds:  None  Social History:   Sexual activity: sexually active; lifetime 4; 1 partner in the last year. Pelvic symptoms: denies vaginal discharge, pain with intercourse  Contraception: Nexplanon 04/2016 Hx of gonorrhea.  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 Sometimes monthly,sometimes skips a month since she is on the Nexplanon Pap smear status: needs to have a pap (discussed)   Eye: wears glasses. Have not went to the doctor in years   Knees:  - sometimes when she does squats or does lunges they tend to lock bilaterally. Reports that sometimes "she can't go all the way down" - lock with prolonged period of time of sitting  - symptoms for more than 1 year but have been stable.  - no history of specific injury - reports that sometimes her knees pop but this is not painful   ROS:  Review of Systems  Constitutional: Negative for chills and fever.  HENT: Negative for congestion and sore throat.   Eyes: Negative for blurred vision and double vision.  Respiratory: Negative for cough and shortness of breath.   Cardiovascular: Negative for chest pain, palpitations and leg swelling.  Gastrointestinal: Negative for abdominal pain, constipation, diarrhea, heartburn, nausea and vomiting.  Genitourinary: Positive for frequency. Negative for dysuria and urgency.  Musculoskeletal: Positive for joint pain. Negative for falls and myalgias.  Skin: Negative for itching and rash.  Neurological: Negative for dizziness, sensory change and headaches.  Endo/Heme/Allergies: Positive for  environmental allergies and polydipsia. Does not bruise/bleed easily.       Polyuria   Psychiatric/Behavioral: Negative for depression. The patient is not nervous/anxious.    [place]:  PMH:  Allergic Rhinitis GERD Acne Obesity  Surgical: none  Family History:  Mom: hypothyroidism Dad: glaucoma FH of diabetes HTN  Cholesterol  Cancer: no cancer   PHYSICAL EXAM: BP 100/68   Pulse 74   Temp 98.6 F (37 C) (Oral)   Ht 5\' 4"  (1.626 m)   Wt 197 lb 6.4 oz (89.5 kg)   SpO2 99%   BMI 33.88 kg/m  Gen: NAD, pleasant, cooperative, obese HEENT: NCAT, PERRL, no palpable thyromegaly or anterior cervical lymphadenopathy Heart: RRR, no murmurs Lungs: CTAB, NWOB Abdomen: soft, nontender to palpation Neuro: grossly nonfocal, speech normal MSK: no effusion noted. No tenderness to palpation of joint lines bilaterally. Ligaments are intact bilaterally. Tessaly test is negative bilaterally. Normal sensation to light touch of lower extremities bilaterally. Normal strength of lower extremities. Normal DP and PT pulses bilaterally.     ASSESSMENT/PLAN: # Health maintenance:  -STD screening: HIV, RPR, urine Gc/Chlamydia today per patient request  -pap smear: patient to make a follow up visit soon   Knee Discomfort:  Unclear in etiology but exam is unremarkable today. Possibly due to weight? I do not think imaging is currently indicated. - provided knee strengthening exercises.   Polyuria:  - UA  - will screen for DM (RF of obesity) > A1c normal    FOLLOW UP: Follow up as soon as possible for pap smear   Smiley Houseman, MD PGY [place]

## 2017-06-07 ENCOUNTER — Ambulatory Visit (INDEPENDENT_AMBULATORY_CARE_PROVIDER_SITE_OTHER): Payer: BLUE CROSS/BLUE SHIELD | Admitting: Internal Medicine

## 2017-06-07 ENCOUNTER — Other Ambulatory Visit (HOSPITAL_COMMUNITY)
Admission: RE | Admit: 2017-06-07 | Discharge: 2017-06-07 | Disposition: A | Discharge status: Home / Self Care | Payer: BLUE CROSS/BLUE SHIELD | Source: Ambulatory Visit | Attending: Family Medicine | Admitting: Family Medicine

## 2017-06-07 ENCOUNTER — Encounter: Payer: Self-pay | Admitting: Internal Medicine

## 2017-06-07 VITALS — BP 100/68 | HR 74 | Temp 98.6°F | Ht 64.0 in | Wt 197.4 lb

## 2017-06-07 DIAGNOSIS — D573 Sickle-cell trait: Secondary | ICD-10-CM

## 2017-06-07 DIAGNOSIS — R358 Other polyuria: Secondary | ICD-10-CM | POA: Insufficient documentation

## 2017-06-07 DIAGNOSIS — Z7689 Persons encountering health services in other specified circumstances: Secondary | ICD-10-CM

## 2017-06-07 DIAGNOSIS — M25569 Pain in unspecified knee: Secondary | ICD-10-CM | POA: Diagnosis not present

## 2017-06-07 DIAGNOSIS — Z6830 Body mass index (BMI) 30.0-30.9, adult: Secondary | ICD-10-CM

## 2017-06-07 DIAGNOSIS — Z0000 Encounter for general adult medical examination without abnormal findings: Secondary | ICD-10-CM | POA: Diagnosis present

## 2017-06-07 DIAGNOSIS — Z113 Encounter for screening for infections with a predominantly sexual mode of transmission: Secondary | ICD-10-CM

## 2017-06-07 DIAGNOSIS — K219 Gastro-esophageal reflux disease without esophagitis: Secondary | ICD-10-CM | POA: Insufficient documentation

## 2017-06-07 DIAGNOSIS — E669 Obesity, unspecified: Secondary | ICD-10-CM | POA: Insufficient documentation

## 2017-06-07 DIAGNOSIS — R3589 Other polyuria: Secondary | ICD-10-CM

## 2017-06-07 HISTORY — DX: Sickle-cell trait: D57.3

## 2017-06-07 LAB — POCT URINALYSIS DIP (MANUAL ENTRY)
Bilirubin, UA: NEGATIVE
Blood, UA: NEGATIVE
Glucose, UA: NEGATIVE mg/dL
Ketones, POC UA: NEGATIVE mg/dL
Leukocytes, UA: NEGATIVE
Nitrite, UA: NEGATIVE
Protein Ur, POC: NEGATIVE mg/dL
Spec Grav, UA: 1.02 (ref 1.010–1.025)
Urobilinogen, UA: 0.2 E.U./dL
pH, UA: 6 (ref 5.0–8.0)

## 2017-06-07 LAB — POCT GLYCOSYLATED HEMOGLOBIN (HGB A1C): Hemoglobin A1C: 5.2

## 2017-06-07 NOTE — Patient Instructions (Signed)
Please make an appointment for your pap smear and do discuss your hair and right hand soon!  Knee Exercises Ask your health care provider which exercises are safe for you. Do exercises exactly as told by your health care provider and adjust them as directed. It is normal to feel mild stretching, pulling, tightness, or discomfort as you do these exercises, but you should stop right away if you feel sudden pain or your pain gets worse.Do not begin these exercises until told by your health care provider. STRETCHING AND RANGE OF MOTION EXERCISES These exercises warm up your muscles and joints and improve the movement and flexibility of your knee. These exercises also help to relieve pain, numbness, and tingling. Exercise A: Knee Extension, Prone 1. Lie on your abdomen on a bed. 2. Place your left / right knee just beyond the edge of the surface so your knee is not on the bed. You can put a towel under your left / right thigh just above your knee for comfort. 3. Relax your leg muscles and allow gravity to straighten your knee. You should feel a stretch behind your left / right knee. 4. Hold this position  5. Scoot up so your knee is supported between repetitions. Repeat ________ times. Complete this stretch __________ times a day. Exercise B: Knee Flexion, Active  1. Lie on your back with both knees straight. If this causes back discomfort, bend your left / right knee so your foot is flat on the floor. 2. Slowly slide your left / right heel back toward your buttocks until you feel a gentle stretch in the front of your knee or thigh. 3. Hold this position for __________ seconds. 4. Slowly slide your left / right heel back to the starting position. Repeat __________ times. Complete this exercise __________ times a day. Exercise C: Quadriceps, Prone  1. Lie on your abdomen on a firm surface, such as a bed or padded floor. 2. Bend your left / right knee and hold your ankle. If you cannot reach your  ankle or pant leg, loop a belt around your foot and grab the belt instead. 3. Gently pull your heel toward your buttocks. Your knee should not slide out to the side. You should feel a stretch in the front of your thigh and knee. 4. Hold this position for __________ seconds. Repeat __________ times. Complete this stretch __________ times a day. Exercise D: Hamstring, Supine 1. Lie on your back. 2. Loop a belt or towel over the ball of your left / right foot. The ball of your foot is on the walking surface, right under your toes. 3. Straighten your left / right knee and slowly pull on the belt to raise your leg until you feel a gentle stretch behind your knee. ? Do not let your left / right knee bend while you do this. ? Keep your other leg flat on the floor. 4. Hold this position for __________ seconds. Repeat __________ times. Complete this stretch __________ times a day. STRENGTHENING EXERCISES These exercises build strength and endurance in your knee. Endurance is the ability to use your muscles for a long time, even after they get tired. Exercise E: Quadriceps, Isometric  1. Lie on your back with your left / right leg extended and your other knee bent. Put a rolled towel or small pillow under your knee if told by your health care provider. 2. Slowly tense the muscles in the front of your left / right thigh. You should see  your kneecap slide up toward your hip or see increased dimpling just above the knee. This motion will push the back of the knee toward the floor. 3. For __________ seconds, keep the muscle as tight as you can without increasing your pain. 4. Relax the muscles slowly and completely. Repeat __________ times. Complete this exercise __________ times a day. Exercise F: Straight Leg Raises - Quadriceps 1. Lie on your back with your left / right leg extended and your other knee bent. 2. Tense the muscles in the front of your left / right thigh. You should see your kneecap slide  up or see increased dimpling just above the knee. Your thigh may even shake a bit. 3. Keep these muscles tight as you raise your leg 4-6 inches (10-15 cm) off the floor. Do not let your knee bend. 4. Hold this position for __________ seconds. 5. Keep these muscles tense as you lower your leg. 6. Relax your muscles slowly and completely after each repetition. Repeat __________ times. Complete this exercise __________ times a day. Exercise G: Hamstring, Isometric 1. Lie on your back on a firm surface. 2. Bend your left / right knee approximately __________ degrees. 3. Dig your left / right heel into the surface as if you are trying to pull it toward your buttocks. Tighten the muscles in the back of your thighs to dig as hard as you can without increasing any pain. 4. Hold this position for __________ seconds. 5. Release the tension gradually and allow your muscles to relax completely for __________ seconds after each repetition. Repeat __________ times. Complete this exercise __________ times a day. Exercise H: Hamstring Curls  If told by your health care provider, do this exercise while wearing ankle weights. Begin with __________ weights. Then increase the weight by 1 lb (0.5 kg) increments. Do not wear ankle weights that are more than __________. 1. Lie on your abdomen with your legs straight. 2. Bend your left / right knee as far as you can without feeling pain. Keep your hips flat against the floor. 3. Hold this position for __________ seconds. 4. Slowly lower your leg to the starting position.  Repeat __________ times. Complete this exercise __________ times a day. Exercise I: Squats (Quadriceps) 1. Stand in front of a table, with your feet and knees pointing straight ahead. You may rest your hands on the table for balance but not for support. 2. Slowly bend your knees and lower your hips like you are going to sit in a chair. ? Keep your weight over your heels, not over your  toes. ? Keep your lower legs upright so they are parallel with the table legs. ? Do not let your hips go lower than your knees. ? Do not bend lower than told by your health care provider. ? If your knee pain increases, do not bend as low. 3. Hold the squat position for __________ seconds. 4. Slowly push with your legs to return to standing. Do not use your hands to pull yourself to standing. Repeat __________ times. Complete this exercise __________ times a day. Exercise J: Wall Slides (Quadriceps)  1. Lean your back against a smooth wall or door while you walk your feet out 18-24 inches (46-61 cm) from it. 2. Place your feet hip-width apart. 3. Slowly slide down the wall or door until your knees bend __________ degrees. Keep your knees over your heels, not over your toes. Keep your knees in line with your hips. 4. Hold for __________ seconds.  Repeat __________ times. Complete this exercise __________ times a day. Exercise K: Straight Leg Raises - Hip Abductors 1. Lie on your side with your left / right leg in the top position. Lie so your head, shoulder, knee, and hip line up. You may bend your bottom knee to help you keep your balance. 2. Roll your hips slightly forward so your hips are stacked directly over each other and your left / right knee is facing forward. 3. Leading with your heel, lift your top leg 4-6 inches (10-15 cm). You should feel the muscles in your outer hip lifting. ? Do not let your foot drift forward. ? Do not let your knee roll toward the ceiling. 4. Hold this position for __________ seconds. 5. Slowly return your leg to the starting position. 6. Let your muscles relax completely after each repetition. Repeat __________ times. Complete this exercise __________ times a day. Exercise L: Straight Leg Raises - Hip Extensors 1. Lie on your abdomen on a firm surface. You can put a pillow under your hips if that is more comfortable. 2. Tense the muscles in your buttocks  and lift your left / right leg about 4-6 inches (10-15 cm). Keep your knee straight as you lift your leg. 3. Hold this position for __________ seconds. 4. Slowly lower your leg to the starting position. 5. Let your leg relax completely after each repetition. Repeat __________ times. Complete this exercise __________ times a day. This information is not intended to replace advice given to you by your health care provider. Make sure you discuss any questions you have with your health care provider. Document Released: 09/20/2005 Document Revised: 07/31/2016 Document Reviewed: 09/12/2015 Elsevier Interactive Patient Education  2018 [employer].

## 2017-06-08 ENCOUNTER — Encounter: Payer: Self-pay | Admitting: Internal Medicine

## 2017-06-08 LAB — RPR: RPR Ser Ql: NONREACTIVE

## 2017-06-08 LAB — URINE CYTOLOGY ANCILLARY ONLY
Chlamydia: NEGATIVE
Neisseria Gonorrhea: NEGATIVE

## 2017-06-08 LAB — HIV ANTIBODY (ROUTINE TESTING W REFLEX): HIV Screen 4th Generation wRfx: NONREACTIVE

## 2017-06-11 ENCOUNTER — Encounter: Payer: Self-pay | Admitting: Internal Medicine

## 2017-06-11 NOTE — Progress Notes (Signed)
Sent letter regarding unremarkable lab work .

## 2017-06-12 ENCOUNTER — Encounter: Payer: Self-pay | Admitting: Internal Medicine

## 2017-06-12 ENCOUNTER — Ambulatory Visit (INDEPENDENT_AMBULATORY_CARE_PROVIDER_SITE_OTHER): Payer: BLUE CROSS/BLUE SHIELD | Admitting: Internal Medicine

## 2017-06-12 ENCOUNTER — Other Ambulatory Visit (HOSPITAL_COMMUNITY)
Admission: RE | Admit: 2017-06-12 | Discharge: 2017-06-12 | Disposition: A | Discharge status: Home / Self Care | Payer: BLUE CROSS/BLUE SHIELD | Source: Ambulatory Visit | Attending: Family Medicine | Admitting: Family Medicine

## 2017-06-12 VITALS — BP 108/64 | HR 89 | Temp 100.1°F | Ht 64.0 in | Wt 199.0 lb

## 2017-06-12 DIAGNOSIS — Z124 Encounter for screening for malignant neoplasm of cervix: Secondary | ICD-10-CM | POA: Diagnosis not present

## 2017-06-12 DIAGNOSIS — R2231 Localized swelling, mass and lump, right upper limb: Secondary | ICD-10-CM

## 2017-06-12 DIAGNOSIS — L659 Nonscarring hair loss, unspecified: Secondary | ICD-10-CM

## 2017-06-12 NOTE — Patient Instructions (Signed)
Thank you for coming in. I will let you know about the results of your pap    Please let me know if you would like to go to the dermatologist again.

## 2017-06-12 NOTE — Progress Notes (Signed)
   [facility] Phone: [phone]   Date of Visit: 06/12/2017   HPI:   Right hand swelling:  - intermittent right hand swelling for the past 2 weeks whenever she goes for a walk. Does not occur at other times. The swelling resolves  after a little while. - it is sometimes painful  - no joint pain, neck pain, or weakness of the right upper extremity.    Hair Loss:  - started loosing hair senior year of high school 2013 August - went to a dermatologist in 2014 Jan and was diagnosed with traction allopecia. Wanted to do a biopsy but patient declined - her hair has been growing back but very slowly  - she is currently not interested in re-referral to dermatology   Cervical Cancer Screen:  - patient has never had a pap smear - denies any abnormal vaginal discharge, abnormal bleeding, pain with intercourse   ROS: See HPI.  [place]:  Obesity Sickle Cell Trait Hair Loss Allergic Rhinitis  PHYSICAL EXAM: BP 108/64   Pulse 89   Temp 100.1 F (37.8 C) (Oral)   Ht 5\' 4"  (1.626 m)   Wt 199 lb (90.3 kg)   SpO2 99%   BMI 34.16 kg/m  GEN: NAD CV: RRR, no murmurs, rubs, or gallops PULM: CTAB, normal effort ABD: Soft, nontender, nondistended, NABS, no organomegaly Female genitalia: normal external genitalia, vulva, vagina, cervix, uterus and adnexa SKIN: No rash or cyanosis; warm and well-perfused EXTR: No lower extremity edema or calf tenderness PSYCH: Mood and affect euthymic, normal rate and volume of speech NEURO: Awake, alert, no focal deficits grossly, normal speech  ASSESSMENT/PLAN:  Health maintenance:  - pap smear completed today   Intermittent Right Hand Swelling:  Normal exam today. Uncertain why this only occurs when she walks outside. Monitor for now. Does not seem to be bothersome to her. Patient to let me know if symptoms worsen or change.   Hair Loss, improving  Patient okay with monitoring since hair is growing back. Declined  re-referral to dermatology.   Follow up PRN and in 1 year for physical   Smiley Houseman, MD PGY [place]

## 2017-06-13 LAB — CERVICOVAGINAL ANCILLARY ONLY
Chlamydia: NEGATIVE
Neisseria Gonorrhea: NEGATIVE

## 2017-06-14 LAB — CYTOLOGY - PAP: Diagnosis: NEGATIVE

## 2017-06-18 ENCOUNTER — Telehealth: Payer: Self-pay

## 2017-06-18 NOTE — Telephone Encounter (Signed)
-----   Message from Bonnita Hollow, MD sent at 06/18/2017  9:03 AM EDT ----- Patient called. I received a busy tone. Will have someone from the office try to reach out to the patient again.

## 2017-06-18 NOTE — Telephone Encounter (Signed)
Pt contacted and informed of negative std results. Pt had no further questions or concerns.

## 2017-06-26 ENCOUNTER — Telehealth: Payer: Self-pay | Admitting: Internal Medicine

## 2017-06-26 ENCOUNTER — Ambulatory Visit (INDEPENDENT_AMBULATORY_CARE_PROVIDER_SITE_OTHER): Payer: BLUE CROSS/BLUE SHIELD | Admitting: *Deleted

## 2017-06-26 DIAGNOSIS — Z111 Encounter for screening for respiratory tuberculosis: Secondary | ICD-10-CM

## 2017-06-26 NOTE — Telephone Encounter (Signed)
Form filled and placed in Ms. Martin's box

## 2017-06-26 NOTE — Telephone Encounter (Signed)
Work form dropped off for at front desk for completion.  Verified that patient section of form has been completed.  Last DOS/WCC with PCP was 06/12/17.  Placed form in blue team folder to be completed by clinical staff.  Christy Barnes

## 2017-06-26 NOTE — Telephone Encounter (Signed)
Clinical info completed on work physical form.  Place form on Dr. Perlie Mayo desk for completion.  Christy Barnes, [place]

## 2017-06-26 NOTE — Progress Notes (Signed)
   PPD placed Left Forearm.  Pt to return Thursday 06/28/2017  for reading.  Pt tolerated intradermal injection. Derl Barrow, RN

## 2017-06-27 NOTE — Telephone Encounter (Signed)
Left voice message for patient that form is complete and ready for pick up.  Martin, Tamika L, RN  

## 2017-06-28 ENCOUNTER — Ambulatory Visit (INDEPENDENT_AMBULATORY_CARE_PROVIDER_SITE_OTHER): Payer: BLUE CROSS/BLUE SHIELD | Admitting: *Deleted

## 2017-06-28 DIAGNOSIS — Z111 Encounter for screening for respiratory tuberculosis: Secondary | ICD-10-CM

## 2017-06-28 LAB — TB SKIN TEST
Induration: 0 mm
TB Skin Test: NEGATIVE

## 2017-06-28 NOTE — Progress Notes (Signed)
   PPD Reading Note PPD read and results entered in Epic. Result: 0 mm induration. Interpretation: Negative Allergic reaction: no Letter with results given for work L. Silvano Rusk, RN, BSN

## 2017-08-09 ENCOUNTER — Ambulatory Visit (INDEPENDENT_AMBULATORY_CARE_PROVIDER_SITE_OTHER): Payer: BLUE CROSS/BLUE SHIELD | Admitting: Family Medicine

## 2017-08-09 ENCOUNTER — Other Ambulatory Visit (HOSPITAL_COMMUNITY)
Admission: RE | Admit: 2017-08-09 | Discharge: 2017-08-09 | Disposition: A | Discharge status: Home / Self Care | Payer: BLUE CROSS/BLUE SHIELD | Source: Ambulatory Visit | Attending: Family Medicine | Admitting: Family Medicine

## 2017-08-09 ENCOUNTER — Encounter: Payer: Self-pay | Admitting: Family Medicine

## 2017-08-09 VITALS — BP 118/60 | HR 82 | Temp 98.6°F | Ht 64.0 in | Wt 207.0 lb

## 2017-08-09 DIAGNOSIS — R079 Chest pain, unspecified: Secondary | ICD-10-CM

## 2017-08-09 DIAGNOSIS — Z113 Encounter for screening for infections with a predominantly sexual mode of transmission: Secondary | ICD-10-CM | POA: Insufficient documentation

## 2017-08-09 DIAGNOSIS — B9689 Other specified bacterial agents as the cause of diseases classified elsewhere: Secondary | ICD-10-CM

## 2017-08-09 DIAGNOSIS — L298 Other pruritus: Secondary | ICD-10-CM | POA: Diagnosis not present

## 2017-08-09 DIAGNOSIS — N760 Acute vaginitis: Secondary | ICD-10-CM

## 2017-08-09 DIAGNOSIS — Z23 Encounter for immunization: Secondary | ICD-10-CM

## 2017-08-09 DIAGNOSIS — L731 Pseudofolliculitis barbae: Secondary | ICD-10-CM

## 2017-08-09 DIAGNOSIS — N898 Other specified noninflammatory disorders of vagina: Secondary | ICD-10-CM

## 2017-08-09 DIAGNOSIS — L739 Follicular disorder, unspecified: Secondary | ICD-10-CM | POA: Insufficient documentation

## 2017-08-09 LAB — POCT WET PREP (WET MOUNT)
Clue Cells Wet Prep Whiff POC: POSITIVE
Trichomonas Wet Prep HPF POC: ABSENT

## 2017-08-09 MED ORDER — METRONIDAZOLE 500 MG PO TABS: 500.0000 mg | ORAL_TABLET | Freq: Two times a day (BID) | ORAL | 0 refills | Status: DC

## 2017-08-09 NOTE — Assessment & Plan Note (Signed)
Likely MSK in origin given ability to reproduce pain on palpation. Given only 2 episodes total and normal exam less likely cardiac in origin.  -ibuprofen/tyelonol prn for pain  -EKG if symptoms persist or worsen -strict return precautions given -advised patient to go the ED if symptoms worsen, become more frequent, or increase in duration

## 2017-08-09 NOTE — Patient Instructions (Addendum)
Bacterial Vaginosis Bacterial vaginosis is an infection of the vagina. It happens when too many germs (bacteria) grow in the vagina. This infection puts you at risk for infections from sex (STIs). Treating this infection can lower your risk for some STIs. You should also treat this if you are pregnant. It can cause your baby to be born early. Follow these instructions at home: Medicines  Take over-the-counter and prescription medicines only as told by your doctor.  Take or use your antibiotic medicine as told by your doctor. Do not stop taking or using it even if you start to feel better. General instructions  If you your sexual partner is a woman, tell her that you have this infection. She needs to get treatment if she has symptoms. If you have a female partner, he does not need to be treated.  During treatment: ? Avoid sex. ? Do not douche. ? Avoid alcohol as told. ? Avoid breastfeeding as told.  Drink enough fluid to keep your pee (urine) clear or pale yellow.  Keep your vagina and butt (rectum) clean. ? Wash the area with warm water every day. ? Wipe from front to back after you use the toilet.  Keep all follow-up visits as told by your doctor. This is important. Preventing this condition  Do not douche.  Use only warm water to wash around your vagina.  Use protection when you have sex. This includes: ? Latex condoms. ? Dental dams.  Limit how many people you have sex with. It is best to only have sex with the same person (be monogamous).  Get tested for STIs. Have your partner get tested.  Wear underwear that is cotton or lined with cotton.  Avoid tight pants and pantyhose. This is most important in summer.  Do not use any products that have nicotine or tobacco in them. These include cigarettes and e-cigarettes. If you need help quitting, ask your doctor.  Do not use illegal drugs.  Limit how much alcohol you drink. Contact a doctor if:  Your symptoms do not get  better, even after you are treated.  You have more discharge or pain when you pee (urinate).  You have a fever.  You have pain in your belly (abdomen).  You have pain with sex.  Your bleed from your vagina between periods. Summary  This infection happens when too many germs (bacteria) grow in the vagina.  Treating this condition can lower your risk for some infections from sex (STIs).  You should also treat this if you are pregnant. It can cause early (premature) birth.  Do not stop taking or using your antibiotic medicine even if you start to feel better. This information is not intended to replace advice given to you by your health care provider. Make sure you discuss any questions you have with your health care provider. Document Released: 08/15/2008 Document Revised: 07/22/2016 Document Reviewed: 07/22/2016 Elsevier Interactive Patient Education  2017 [employer].   It was a pleasure meeting you today.   Today we discussed your vaginal irritation and chest pain.   For your vaginal irritation we have done an exam and ran STD testing as well. I will contact you with results either by telephone or by letter. I recommend using condoms for protection during intercourse as this will help prevent any STD's. Please return if you are having more discharge or pain. Your wet prep came back and showed bacterial vaginosis. I have prescribed a 7 day course of metronidazole to be taken  twice a day for 7 days. DO NOT DRINK ALCOHOL WHILE ON THIS MEDICATION.   For your chest pain since it is not consistent and it is reproducible to palpation I am thinking it is more musculoskeletal in origin. Please use over the counter ibuprofen or tylenol for pain. If you notice the episodes get longer or change in how they feel please go the the emergency room.   Please follow up as needed or sooner if symptoms persist or worsen.  Our clinic's number is [phone]. Please call with questions or concerns.    Thank you,  Caroline More, DO

## 2017-08-09 NOTE — Assessment & Plan Note (Signed)
Given recent history of unprotected sex will perform STD testing -will test for gonorrhea/chlamydia, HIV, and perform wet prep -will inform patient of results either via telephone or letter  -strict return precautions given -advised patient to use condoms for protection for STDs

## 2017-08-09 NOTE — Assessment & Plan Note (Signed)
Bump on left inguinal fold likely due to ingrown hair given history of shaving region and lack of tenderness to palpation. Patient does not complain of pain, itching, discharge, or bleeding. Non-vesicular in appearance so less likely herpes.  -continue to monitor -advised patient to return if becoming more painful or has discharge

## 2017-08-09 NOTE — Progress Notes (Signed)
Subjective:    Patient ID: Christy Barnes, female    DOB: [birth year], 21 y.o.   MRN: [id number]   CC: Vaginal irritation & chest pain & bump   HPI:  Vaginal irritaiton  Patient has had vaginal irritation beginning last week. Patient endorses an odor and states it is "rotten" or "rancid" in odor. Denies fishy odor. Denies discharge or burning. Notes some itching but has no need to scratch. Patient states she "knows something is wrong with vagina". Patient had unprotected sex for first time 2-3 weeks ago and has not had STD screening since encounter. Patient today states she would like full std workup.   Patient uses nexplanon for contraception. Patient is sexually active with one partner x 3 months but normally uses condoms for protection. Patient is still having periods and last period ended on last Wednesday.   Chest pain  Patient notes 2 episodes of chest pain since last week, most recently on Tuesday. Patient notes it to be a "heavy feeling like a rock" that is midsternal with no radiation of pain. Patient states it is on and off and there is no trigger to the event. Episodes last only a few minutes, patient thinks longest was 10 minutes and subside. Patient notes one episode of right arm weakness with pain. Pain is better with warm water or tea, but notes nothing makes pain worse. Pain is worse at night. Pain occurs when resting, sitting up in chair. No pain during movement. Patient denies diaphoresis or pain elsewhere. Patient states not associated with anxiety but notes some SOB when episodes occur. Patient states no worsening with spicy or acidic foods. Patient has not tried any medications to relieve pain.   No history of reflux. No cardiac hx in family that patient is unaware of. Patient states no one in family with heart   Bump Patient notes small bump in right inguinal fold. Patient denies drainage, or bleeding. Patient states she shaves regularly in region. Notes no tenderness,  itching, or irritation. No associated fever.   Smoking status reviewed: non-smoker   Review of Systems All negative other than noted in HPI  Objective:  BP 118/60   Pulse 82   Temp 98.6 F (37 C) (Oral)   Ht 5\' 4"  (1.626 m)   Wt 207 lb (93.9 kg)   SpO2 99%   BMI 35.53 kg/m  Vitals and nursing note reviewed  General: well nourished, in no acute distress HEENT: normocephalic, no scleral icterus or conjunctival pallor, PERRL, no nasal discharge, moist mucous membranes Neck: supple, non-tender, without lymphadenopathy Cardiac: RRR, clear S1 and S2, no murmurs, rubs, or gallops Respiratory: clear to auscultation bilaterally, no increased work of breathing Abdomen: soft, nontender, nondistended, no masses or organomegaly. Bowel sounds present Extremities: no edema or cyanosis. Warm, well perfused. 2+ radial and PT pulses bilaterally Skin: warm and dry, small raised skin colored nodule in left inguinal fold, non-vesicular in appearance, dry.  Neuro: alert and oriented, no focal deficits Female genitalia: Cervix: not indicated, cervical discharge present - white, creamy and thick and cervical motion tenderness absent normal external genitalia    Assessment & Plan:    Vaginal itching Given recent history of unprotected sex will perform STD testing -will test for gonorrhea/chlamydia, HIV, and perform wet prep -will inform patient of results either via telephone or letter  -strict return precautions given -advised patient to use condoms for protection for STDs  Chest pain Likely MSK in origin given ability to reproduce pain on  palpation. Given only 2 episodes total and normal exam less likely cardiac in origin.  -ibuprofen/tyelonol prn for pain  -EKG if symptoms persist or worsen -strict return precautions given -advised patient to go the ED if symptoms worsen, become more frequent, or increase in duration   Ingrown hair Bump on left inguinal fold likely due to ingrown hair  given history of shaving region and lack of tenderness to palpation. Patient does not complain of pain, itching, discharge, or bleeding. Non-vesicular in appearance so less likely herpes.  -continue to monitor -advised patient to return if becoming more painful or has discharge    Return if symptoms worsen or fail to improve.   Caroline More, DO, PGY-1

## 2017-08-10 ENCOUNTER — Other Ambulatory Visit: Payer: Self-pay | Admitting: *Deleted

## 2017-08-10 ENCOUNTER — Encounter: Payer: Self-pay | Admitting: Family Medicine

## 2017-08-10 LAB — CERVICOVAGINAL ANCILLARY ONLY
Chlamydia: NEGATIVE
Neisseria Gonorrhea: NEGATIVE

## 2017-08-10 LAB — HIV ANTIBODY (ROUTINE TESTING W REFLEX): HIV Screen 4th Generation wRfx: NONREACTIVE

## 2017-08-10 MED ORDER — FLUCONAZOLE 150 MG PO TABS: ORAL_TABLET | ORAL | 0 refills | Status: DC

## 2017-08-10 NOTE — Telephone Encounter (Signed)
Patient called stating she need medication for yeast infection due taking antibiotic for BV  Christy Barrow, RN

## 2017-08-13 ENCOUNTER — Encounter: Payer: Self-pay | Admitting: *Deleted

## 2017-08-27 DIAGNOSIS — H04123 Dry eye syndrome of bilateral lacrimal glands: Secondary | ICD-10-CM | POA: Diagnosis not present

## 2017-09-14 ENCOUNTER — Encounter: Payer: Self-pay | Admitting: Family Medicine

## 2017-09-14 ENCOUNTER — Ambulatory Visit (INDEPENDENT_AMBULATORY_CARE_PROVIDER_SITE_OTHER): Payer: BLUE CROSS/BLUE SHIELD | Admitting: Family Medicine

## 2017-09-14 DIAGNOSIS — N898 Other specified noninflammatory disorders of vagina: Secondary | ICD-10-CM | POA: Insufficient documentation

## 2017-09-14 NOTE — Progress Notes (Signed)
   CC: vaginal irritation  HPI Vaginal irritation: started Monday or Tuesday. Burning when she pees. Monogamous with one partner, had STI check 1 month ago. On nexplanon for birth control, has some intermittent spotting (some present now). LMP first week of October. Denies itching or abnormal discharge, reports pain over posterior introitus.   ROS: denies fever, CP, SOB, abd pain, dysuria, changes in BMs.    CC, SH/smoking status, and VS noted  Objective: BP 100/60   Pulse 84   Temp 99 F (37.2 C) (Oral)   Wt 201 lb (91.2 kg)   LMP 08/24/2017   SpO2 99%   BMI 34.50 kg/m  Gen: NAD, alert, cooperative, and pleasant.  HEENT: NCAT, EOMI, PERRL CV: RRR, no murmur Resp: CTAB, no wheezes, non-labored Abd: SNTND, BS present, no guarding or organomegaly GU: normal external female genitalia without rash or sore. Scattered <1cm skin tears over posterior aspect of labia minora. Normal vaginal vault with menstrual blood present. Nabothian cysts on cervix, no abnormal discharge or odor.  Ext: No edema, warm Neuro: Alert and oriented, Speech clear, No gross deficits  Assessment and plan:  Vaginal irritation Mild scattered skin tears over posterior labia minora likely causing burning with urination. No concern for STI, patient declines testing. Explained that these can be caused by sex or using tampons. Expect rapid healing without intervention or treatment. Counseled to try peri bottle with urination if she can get this at [employer] or drugstore.   Health Maintenance reviewed - UTD.  Ralene Ok, MD, PGY2 09/14/2017 10:24 AM

## 2017-09-14 NOTE — Patient Instructions (Signed)
It was a pleasure to see you today! Thank you for choosing [facility] for your primary care. Christy Barnes was seen for vaginal irritation.   Our plans for today were:  Nothing abnormal or scary is going on. You have a small skin tear that can cause significant pain or burning. Unfortunately, there is nothing you can do to make it heal faster.    Best,  Dr. Lindell Noe   Perineal Bottle

## 2017-09-14 NOTE — Assessment & Plan Note (Signed)
Mild scattered skin tears over posterior labia minora likely causing burning with urination. No concern for STI, patient declines testing. Explained that these can be caused by sex or using tampons. Expect rapid healing without intervention or treatment. Counseled to try peri bottle with urination if she can get this at [employer] or drugstore.

## 2017-11-08 ENCOUNTER — Other Ambulatory Visit: Payer: Self-pay

## 2017-11-08 ENCOUNTER — Encounter (HOSPITAL_COMMUNITY): Payer: Self-pay | Admitting: *Deleted

## 2017-11-08 ENCOUNTER — Emergency Department (HOSPITAL_COMMUNITY)
Admission: EM | Admit: 2017-11-08 | Discharge: 2017-11-09 | Disposition: A | Discharge status: Other Acute Inpatient Hospital | Payer: BLUE CROSS/BLUE SHIELD | Attending: Emergency Medicine | Admitting: Emergency Medicine

## 2017-11-08 DIAGNOSIS — F341 Dysthymic disorder: Secondary | ICD-10-CM | POA: Insufficient documentation

## 2017-11-08 DIAGNOSIS — Z91010 Allergy to peanuts: Secondary | ICD-10-CM | POA: Insufficient documentation

## 2017-11-08 DIAGNOSIS — Z79899 Other long term (current) drug therapy: Secondary | ICD-10-CM | POA: Insufficient documentation

## 2017-11-08 DIAGNOSIS — Z915 Personal history of self-harm: Secondary | ICD-10-CM | POA: Diagnosis not present

## 2017-11-08 DIAGNOSIS — R45851 Suicidal ideations: Secondary | ICD-10-CM | POA: Insufficient documentation

## 2017-11-08 DIAGNOSIS — Z046 Encounter for general psychiatric examination, requested by authority: Secondary | ICD-10-CM | POA: Insufficient documentation

## 2017-11-08 DIAGNOSIS — F329 Major depressive disorder, single episode, unspecified: Secondary | ICD-10-CM | POA: Diagnosis not present

## 2017-11-08 LAB — CBC WITH DIFFERENTIAL/PLATELET
Basophils Absolute: 0 10*3/uL (ref 0.0–0.1)
Basophils Relative: 1 %
Eosinophils Absolute: 0.1 10*3/uL (ref 0.0–0.7)
Eosinophils Relative: 2 %
HCT: 40.5 % (ref 36.0–46.0)
Hemoglobin: 14.2 g/dL (ref 12.0–15.0)
Lymphocytes Relative: 37 %
Lymphs Abs: 2.2 10*3/uL (ref 0.7–4.0)
MCH: 31.2 pg (ref 26.0–34.0)
MCHC: 35.1 g/dL (ref 30.0–36.0)
MCV: 89 fL (ref 78.0–100.0)
Monocytes Absolute: 0.3 10*3/uL (ref 0.1–1.0)
Monocytes Relative: 5 %
Neutro Abs: 3.3 10*3/uL (ref 1.7–7.7)
Neutrophils Relative %: 55 %
Platelets: 298 10*3/uL (ref 150–400)
RBC: 4.55 MIL/uL (ref 3.87–5.11)
RDW: 13 % (ref 11.5–15.5)
WBC: 5.9 10*3/uL (ref 4.0–10.5)

## 2017-11-08 LAB — COMPREHENSIVE METABOLIC PANEL
ALT: 21 U/L (ref 14–54)
AST: 34 U/L (ref 15–41)
Albumin: 4.2 g/dL (ref 3.5–5.0)
Alkaline Phosphatase: 54 U/L (ref 38–126)
Anion gap: 7 (ref 5–15)
BUN: 9 mg/dL (ref 6–20)
CO2: 24 mmol/L (ref 22–32)
Calcium: 9.1 mg/dL (ref 8.9–10.3)
Chloride: 104 mmol/L (ref 101–111)
Creatinine, Ser: 0.88 mg/dL (ref 0.44–1.00)
GFR calc Af Amer: >60 mL/min (ref >60)
GFR calc non Af Amer: >60 mL/min (ref >60)
Glucose, Bld: 102 mg/dL — ABNORMAL HIGH (ref 65–99)
Potassium: 3.5 mmol/L (ref 3.5–5.1)
Sodium: 135 mmol/L (ref 135–145)
Total Bilirubin: 2 mg/dL — ABNORMAL HIGH (ref 0.3–1.2)
Total Protein: 7.4 g/dL (ref 6.5–8.1)

## 2017-11-08 LAB — I-STAT BETA HCG BLOOD, ED (MC, WL, AP ONLY): I-stat hCG, quantitative: <5 m[IU]/mL (ref <5)

## 2017-11-08 LAB — SALICYLATE LEVEL: Salicylate Lvl: <7 mg/dL (ref 2.8–30.0)

## 2017-11-08 LAB — RAPID URINE DRUG SCREEN, HOSP PERFORMED
Amphetamines: NOT DETECTED
Barbiturates: NOT DETECTED
Benzodiazepines: NOT DETECTED
Cocaine: NOT DETECTED
Opiates: NOT DETECTED
Tetrahydrocannabinol: NOT DETECTED

## 2017-11-08 LAB — ACETAMINOPHEN LEVEL: Acetaminophen (Tylenol), Serum: <10 ug/mL — ABNORMAL LOW (ref 10–30)

## 2017-11-08 LAB — ETHANOL: Alcohol, Ethyl (B): <10 mg/dL (ref <10)
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
## 2017-11-08 NOTE — ED Notes (Signed)
Ovid Curd - Father [phone] Dad wants patient tts to call.

## 2017-11-08 NOTE — ED Provider Notes (Addendum)
[facility] DEPT Provider Note   CSN: [id number] Arrival date & time: 11/08/17  2117     History   Chief Complaint Chief Complaint  Patient presents with  . Suicidal    HPI Christy Barnes is a 21 y.o. female.  21 year old female presents with [employer] after a friend called them because the patient had texted them with suicidal ideations.  When [employer] arrived patient has superficial lacerations to her left distal forearm.  Patient states has been under a great deal of stress recently.  Denies responding to internal stimuli.  Denies any homicidal ideations.  No prior history of suicide attempt.  Denies any current ingestions.  No psychiatric history.  Denies any numbness or tingling to her left hand.  States she performed a lacerations because she went to help ease the pain.  Denies any prior history of cutting.      Past Medical History:  Diagnosis Date  . Labial tear freshman year  . Overweight and obesity(278.0)     Patient Active Problem List   Diagnosis Date Noted  . Vaginal irritation 09/14/2017  . Bacterial vaginosis 08/09/2017  . Chest pain 08/09/2017  . Ingrown hair 08/09/2017  . Sickle cell trait ([place]) 06/07/2017  . Allergic rhinitis 05/08/2014  . Obesity (BMI 30-39.9) 11/04/2013  . Acne 11/04/2013  . Presence of subdermal contraceptive device 05/04/2013  . Hair loss 04/16/2013    Past Surgical History:  Procedure Laterality Date  . labia repair  freshman year    OB History    No data available       Home Medications    Prior to Admission medications   Medication Sig Start Date End Date Taking? Authorizing Provider  EPINEPHrine 0.3 mg/0.3 mL IJ SOAJ injection  07/25/16   [provider]  fluconazole (DIFLUCAN) 150 MG tablet 1 tab po x 1. May repeat in 72 hours if no improvement 08/10/17   Caroline More, DO  metroNIDAZOLE (FLAGYL) 500 MG tablet Take 1 tablet (500 mg total) by mouth 2 (two) times daily. 08/09/17    Caroline More, DO    Family History Family History  Problem Relation Age of Onset  . Thyroid disease Mother     Social History Social History   Tobacco Use  . Smoking status: Never Smoker  . Smokeless tobacco: Never Used  Substance Use Topics  . Alcohol use: Yes    Alcohol/week: 0.6 oz    Types: 1 Glasses of wine per week    Comment: "socially"  . Drug use: No     Allergies   Fish allergy; Peanut-containing drug products; and Shrimp [shellfish allergy]   Review of Systems Review of Systems  All other systems reviewed and are negative.    Physical Exam Updated Vital Signs BP 133/83 (BP Location: Left Arm)   Pulse 88   Temp 97.8 F (36.6 C) (Oral)   Resp 14   Ht 1.626 m (5\' 4" )   Wt 86.2 kg (190 lb)   LMP 10/18/2017 (Exact Date)   SpO2 99%   BMI 32.61 kg/m   Physical Exam  Constitutional: She is oriented to person, place, and time. She appears well-developed and well-nourished.  Non-toxic appearance. No distress.  HENT:  Head: Normocephalic and atraumatic.  Eyes: Conjunctivae, EOM and lids are normal. Pupils are equal, round, and reactive to light.  Neck: Normal range of motion. Neck supple. No tracheal deviation present. No thyroid mass present.  Cardiovascular: Normal rate, regular rhythm and normal heart  sounds. Exam reveals no gallop.  No murmur heard. Pulmonary/Chest: Effort normal and breath sounds normal. No stridor. No respiratory distress. She has no decreased breath sounds. She has no wheezes. She has no rhonchi. She has no rales.  Abdominal: Soft. Normal appearance and bowel sounds are normal. She exhibits no distension. There is no tenderness. There is no rebound and no CVA tenderness.  Musculoskeletal: Normal range of motion. She exhibits no edema or tenderness.       Arms: Neurological: She is alert and oriented to person, place, and time. She has normal strength. No cranial nerve deficit or sensory deficit. GCS eye subscore is 4. GCS verbal  subscore is 5. GCS motor subscore is 6.  Skin: Skin is warm and dry. No abrasion and no rash noted.  Psychiatric: Her affect is blunt. Her speech is delayed. She is withdrawn. She is not actively hallucinating. She expresses suicidal ideation. She is attentive.  Nursing note and vitals reviewed.    ED Treatments / Results  Labs (all labs ordered are listed, but only abnormal results are displayed) Labs Reviewed  ETHANOL  RAPID URINE DRUG SCREEN, HOSP PERFORMED  SALICYLATE LEVEL  ACETAMINOPHEN LEVEL  CBC WITH DIFFERENTIAL/PLATELET  COMPREHENSIVE METABOLIC PANEL  I-STAT BETA HCG BLOOD, ED ([facility], [facility], [facility] ONLY)  I-STAT BETA HCG BLOOD, ED ([facility], [facility], [facility] ONLY)    EKG  EKG Interpretation None       Radiology No results found.  Procedures Procedures (including critical care time)  Medications Ordered in ED Medications - No data to display   Initial Impression / Assessment and Plan / ED Course  I have reviewed the triage vital signs and the nursing notes.  Pertinent labs & imaging results that were available during my care of the patient were reviewed by me and considered in my medical decision making (see chart for details).     Wounds on wrist are superficial and do not need sutures Will check labs and pt medically cleared  Will consult tts  Final Clinical Impressions(s) / ED Diagnoses   Final diagnoses:  None    ED Discharge Orders    None       Lacretia Leigh, MD 11/08/17 2149    Lacretia Leigh, MD 11/08/17 2150

## 2017-11-08 NOTE — ED Triage Notes (Signed)
Pt brought in by [employer].  Pt stated "I told a hand full of my close friends  That I didn't want to be here.  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  It's all the stress of going to school.  I cut my wrist to try to distract me."

## 2017-11-08 NOTE — BH Assessment (Signed)
[facility] Assessment Progress Note  Per Lindon Romp, NP pt meets criteria for inpt treatment. [facility] is currently reviewing for possible admission. EDP Dr. Zenia Resides, MD has been made aware of recommendations. Pt's nurse Margaretha Sheffield, RN notified of disposition and pending bed status.  Lind Covert, MSW, LCSW Therapeutic Triage Specialist  [phone]

## 2017-11-09 ENCOUNTER — Inpatient Hospital Stay (HOSPITAL_COMMUNITY)
Admission: AD | Admit: 2017-11-09 | Discharge: 2017-11-10 | DRG: 885 | Disposition: A | Discharge status: Home / Self Care | Payer: BLUE CROSS/BLUE SHIELD | Source: Intra-hospital | Attending: Psychiatry | Admitting: Psychiatry

## 2017-11-09 ENCOUNTER — Encounter (HOSPITAL_COMMUNITY): Payer: Self-pay

## 2017-11-09 ENCOUNTER — Other Ambulatory Visit: Payer: Self-pay

## 2017-11-09 DIAGNOSIS — J309 Allergic rhinitis, unspecified: Secondary | ICD-10-CM | POA: Diagnosis not present

## 2017-11-09 DIAGNOSIS — F322 Major depressive disorder, single episode, severe without psychotic features: Secondary | ICD-10-CM | POA: Diagnosis present

## 2017-11-09 DIAGNOSIS — Z975 Presence of (intrauterine) contraceptive device: Secondary | ICD-10-CM | POA: Diagnosis not present

## 2017-11-09 DIAGNOSIS — Z91013 Allergy to seafood: Secondary | ICD-10-CM

## 2017-11-09 DIAGNOSIS — R45851 Suicidal ideations: Secondary | ICD-10-CM | POA: Diagnosis not present

## 2017-11-09 DIAGNOSIS — E669 Obesity, unspecified: Secondary | ICD-10-CM | POA: Diagnosis present

## 2017-11-09 DIAGNOSIS — Z91010 Allergy to peanuts: Secondary | ICD-10-CM

## 2017-11-09 DIAGNOSIS — F4320 Adjustment disorder, unspecified: Secondary | ICD-10-CM | POA: Diagnosis not present

## 2017-11-09 DIAGNOSIS — Z6836 Body mass index (BMI) 36.0-36.9, adult: Secondary | ICD-10-CM

## 2017-11-09 DIAGNOSIS — F639 Impulse disorder, unspecified: Secondary | ICD-10-CM

## 2017-11-09 DIAGNOSIS — D573 Sickle-cell trait: Secondary | ICD-10-CM | POA: Diagnosis not present

## 2017-11-09 MED ORDER — ALUM & MAG HYDROXIDE-SIMETH 200-200-20 MG/5ML PO SUSP: 30.0000 mL | ORAL | Status: DC | PRN

## 2017-11-09 MED ORDER — MAGNESIUM HYDROXIDE 400 MG/5ML PO SUSP: 30.0000 mL | Freq: Every day | ORAL | Status: DC | PRN

## 2017-11-09 MED ORDER — TRAZODONE HCL 50 MG PO TABS: 50.0000 mg | ORAL_TABLET | Freq: Every evening | ORAL | Status: DC | PRN

## 2017-11-09 MED ORDER — ACETAMINOPHEN 325 MG PO TABS: 650.0000 mg | ORAL_TABLET | Freq: Four times a day (QID) | ORAL | Status: DC | PRN

## 2017-11-09 MED ORDER — HYDROXYZINE HCL 25 MG PO TABS: 25.0000 mg | ORAL_TABLET | Freq: Three times a day (TID) | ORAL | Status: DC | PRN

## 2017-11-09 MED ORDER — EPINEPHRINE 0.3 MG/0.3ML IJ SOAJ: 0.3000 mg | INTRAMUSCULAR | Status: DC | PRN

## 2017-11-09 NOTE — ED Notes (Signed)
Attempted to call report.  Receiving RN unavailable & to call back.

## 2017-11-09 NOTE — Progress Notes (Signed)
Went to pt's room to complete admission and pt was in bathroom with shower running. Pt poked head out of bathroom door and said she would come to nurses' station once done with shower.

## 2017-11-09 NOTE — H&P (Signed)
Psychiatric Admission Assessment Adult  Patient Identification: Christy Barnes MRN:  [id number] Date of Evaluation:  11/09/2017 Chief Complaint:  Suicidal behavior Principal Diagnosis: Impulse control disorder                                        Adjustment disorder Diagnosis:   Patient Active Problem List   Diagnosis Date Noted  . MDD (major depressive disorder), severe ([place]) [F32.2] 11/09/2017  . Vaginal irritation [N89.8] 09/14/2017  . Bacterial vaginosis [N76.0, B96.89] 08/09/2017  . Chest pain [R07.9] 08/09/2017  . Ingrown hair [L73.1] 08/09/2017  . Sickle cell trait ([place]) [D57.3] 06/07/2017  . Allergic rhinitis [J30.9] 05/08/2014  . Obesity (BMI 30-39.9) [E66.9] 11/04/2013  . Acne [L70.9] 11/04/2013  . Presence of subdermal contraceptive device [Z97.5] 05/04/2013  . 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 No past history of mental illness. Presented to the hospital via the police. Sent text to her friends expressing suicidal thoughts. Her friends alerted the police.  Patient inflicted multiple superficial lacerations on flexor surface of her left wrist. Reports that her main stressor is recent poor grades. Routine labs normal, toxicology is negative,  UDS negative , BAL <31m/dl.  At interview, patient reports high expectations from her family. Says process of graduating, internship and getting back into grad school has been stressful. Says she has been living with her parents since she started pre medical school. Patient states that she also decided to take a part time job. Says studying and working has been difficult to combine. Says she recently found out her grades was not as good as she would want them to be. Says she instantly felt depressed. She felt hopeless and worthless. Says she just did not know how she was going to explain that to her parents. Patient says she then sent the messages and acted  impulsively. Patient regrets her actions. Denies any lingering suicidal thoughts. Says she has decided she would give up work from next semester. Patient denies any depression prior to finding out about her grades. She denies any lingering depression now. Says she is optimistic. Reports normal appetite and energy. Reports normal thinking process. Reports being motivated to do better. No evidence of psychosis. No evidence of mania. No current thoughts of suicide. No thoughts of violence. No homicidal thoughts. No substance use. No access to weapons.  Total Time spent with patient: 1 hour  Past Psychiatric History:  No past contact with mental health providers. No past treatment with medications. No past history of depression. No past history of mania. No past history of psychosis. No past history of suicidal behavior. No past history of violent behavior.    Is the patient at risk to self? No.  Has the patient been a risk to self in the past 6 months? No.  Has the patient been a risk to self within the distant past? No.  Is the patient a risk to others? No.  Has the patient been a risk to others in the past 6 months? No.  Has the patient been a risk to others within the distant past? No.   Prior Inpatient Therapy:   Prior Outpatient Therapy:    Alcohol Screening: 1. How often do you have a drink containing alcohol?: 2 to 4 times a month 2. How many drinks containing alcohol do you  have on a typical day when you are drinking?: 1 or 2 3. How often do you have six or more drinks on one occasion?: Never AUDIT-C Score: 2 4. How often during the last year have you found that you were not able to stop drinking once you had started?: Never 5. How often during the last year have you failed to do what was normally expected from you becasue of drinking?: Never 6. How often during the last year have you needed a first drink in the morning to get yourself going after a heavy drinking session?: Never 7. How  often during the last year have you had a feeling of guilt of remorse after drinking?: Never 8. How often during the last year have you been unable to remember what happened the night before because you had been drinking?: Never 9. Have you or someone else been injured as a result of your drinking?: No 10. Has a relative or friend or a doctor or another health worker been concerned about your drinking or suggested you cut down?: No Alcohol Use Disorder Identification Test Final Score (AUDIT): 2 Intervention/Follow-up: AUDIT Score <7 follow-up not indicated Substance Abuse History in the last 12 months:  No. Consequences of Substance Abuse: NA Previous Psychotropic Medications: No  Psychological Evaluations: No  Past Medical History:  Past Medical History:  Diagnosis Date  . Labial tear freshman year  . Overweight and obesity(278.0)     Past Surgical History:  Procedure Laterality Date  . labia repair  freshman year   Family History:  Family History  Problem Relation Age of Onset  . Thyroid disease Mother    Family Psychiatric  History: Denies any family history of mental illness, substance use disorder or suicide.  Tobacco Screening: Have you used any form of tobacco in the last 30 days? (Cigarettes, Smokeless Tobacco, Cigars, and/or Pipes): No Social History:  Social History   Substance and Sexual Activity  Alcohol Use Yes  . Alcohol/week: 0.6 oz  . Types: 1 Glasses of wine per week   Comment: "socially"     Social History   Substance and Sexual Activity  Drug Use No    Additional Social History: Are you sexually active?: No What is your sexual orientation?: straight Does patient have children?: No                         Allergies:   Allergies  Allergen Reactions  . Fish Allergy   . Peanut-Containing Drug Products   . Shrimp [Shellfish Allergy]    Lab Results:  Results for orders placed or performed during the hospital encounter of 11/08/17 (from  the past 48 hour(s))  Rapid urine drug screen (hospital performed)     Status: None   Collection Time: 11/08/17  9:47 PM  Result Value Ref Range   Opiates NONE DETECTED NONE DETECTED   Cocaine NONE DETECTED NONE DETECTED   Benzodiazepines NONE DETECTED NONE DETECTED   Amphetamines NONE DETECTED NONE DETECTED   Tetrahydrocannabinol NONE DETECTED NONE DETECTED   Barbiturates NONE DETECTED NONE DETECTED    Comment: (NOTE) DRUG SCREEN FOR MEDICAL PURPOSES ONLY.  IF CONFIRMATION IS NEEDED FOR ANY PURPOSE, NOTIFY LAB WITHIN 5 DAYS. LOWEST DETECTABLE LIMITS FOR URINE DRUG SCREEN Drug Class                     Cutoff (ng/mL) Amphetamine and metabolites    1000 Barbiturate and metabolites    200  Benzodiazepine                 161 Tricyclics and metabolites     300 Opiates and metabolites        300 Cocaine and metabolites        300 THC                            50   Ethanol     Status: None   Collection Time: 11/08/17  9:57 PM  Result Value Ref Range   Alcohol, Ethyl (B) <10 <10 mg/dL    Comment:        LOWEST DETECTABLE LIMIT FOR SERUM ALCOHOL IS 10 mg/dL FOR MEDICAL PURPOSES ONLY   Salicylate level     Status: None   Collection Time: 11/08/17  9:57 PM  Result Value Ref Range   Salicylate Lvl <0.9 2.8 - 30.0 mg/dL  Acetaminophen level     Status: Abnormal   Collection Time: 11/08/17  9:57 PM  Result Value Ref Range   Acetaminophen (Tylenol), Serum <10 (L) 10 - 30 ug/mL    Comment:        THERAPEUTIC CONCENTRATIONS VARY SIGNIFICANTLY. A RANGE OF 10-30 ug/mL MAY BE AN EFFECTIVE CONCENTRATION FOR MANY PATIENTS. HOWEVER, SOME ARE BEST TREATED AT CONCENTRATIONS OUTSIDE THIS RANGE. ACETAMINOPHEN CONCENTRATIONS >150 ug/mL AT 4 HOURS AFTER INGESTION AND >50 ug/mL AT 12 HOURS AFTER INGESTION ARE OFTEN ASSOCIATED WITH TOXIC REACTIONS.   CBC with Differential/Platelet     Status: None   Collection Time: 11/08/17  9:57 PM  Result Value Ref Range   WBC 5.9 4.0 - 10.5 K/uL    RBC 4.55 3.87 - 5.11 MIL/uL   Hemoglobin 14.2 12.0 - 15.0 g/dL   HCT 40.5 36.0 - 46.0 %   MCV 89.0 78.0 - 100.0 fL   MCH 31.2 26.0 - 34.0 pg   MCHC 35.1 30.0 - 36.0 g/dL   RDW 13.0 11.5 - 15.5 %   Platelets 298 150 - 400 K/uL   Neutrophils Relative % 55 %   Neutro Abs 3.3 1.7 - 7.7 K/uL   Lymphocytes Relative 37 %   Lymphs Abs 2.2 0.7 - 4.0 K/uL   Monocytes Relative 5 %   Monocytes Absolute 0.3 0.1 - 1.0 K/uL   Eosinophils Relative 2 %   Eosinophils Absolute 0.1 0.0 - 0.7 K/uL   Basophils Relative 1 %   Basophils Absolute 0.0 0.0 - 0.1 K/uL  Comprehensive metabolic panel     Status: Abnormal   Collection Time: 11/08/17  9:57 PM  Result Value Ref Range   Sodium 135 135 - 145 mmol/L   Potassium 3.5 3.5 - 5.1 mmol/L   Chloride 104 101 - 111 mmol/L   CO2 24 22 - 32 mmol/L   Glucose, Bld 102 (H) 65 - 99 mg/dL   BUN 9 6 - 20 mg/dL   Creatinine, Ser 0.88 0.44 - 1.00 mg/dL   Calcium 9.1 8.9 - 10.3 mg/dL   Total Protein 7.4 6.5 - 8.1 g/dL   Albumin 4.2 3.5 - 5.0 g/dL   AST 34 15 - 41 U/L   ALT 21 14 - 54 U/L   Alkaline Phosphatase 54 38 - 126 U/L   Total Bilirubin 2.0 (H) 0.3 - 1.2 mg/dL   GFR calc non Af Amer >60 >60 mL/min   GFR calc Af Amer >60 >60 mL/min    Comment: (NOTE) The eGFR has been  calculated using the CKD EPI equation. This calculation has not been validated in all clinical situations. eGFR's persistently <60 mL/min signify possible Chronic Kidney Disease.    Anion gap 7 5 - 15  I-Stat beta hCG blood, ED ([facility], [facility], [facility] only)     Status: None   Collection Time: 11/08/17 10:10 PM  Result Value Ref Range   I-stat hCG, quantitative <5.0 <5 mIU/mL   Comment 3            Comment:   GEST. AGE      CONC.  (mIU/mL)   <=1 WEEK        5 - 50     2 WEEKS       50 - 500     3 WEEKS       100 - 10,000     4 WEEKS     1,000 - 30,000        FEMALE AND NON-PREGNANT FEMALE:     LESS THAN 5 mIU/mL     Blood Alcohol level:  Lab Results  Component Value Date   ETH <10  16/08/9603    Metabolic Disorder Labs:  Lab Results  Component Value Date   HGBA1C 5.2 06/07/2017   MPG 105 06/25/2015   No results found for: PROLACTIN Lab Results  Component Value Date   CHOL 153 06/25/2015   TRIG 39 11/04/2013   HDL 69 06/25/2015   CHOLHDL 3.0 11/04/2013   VLDL 8 11/04/2013   LDLCALC 119 (H) 11/04/2013    Current Medications: Current Facility-Administered Medications  Medication Dose Route Frequency Provider Last Rate Last Dose  . acetaminophen (TYLENOL) tablet 650 mg  650 mg Oral Q6H PRN Money, Lowry Ram, FNP      . alum & mag hydroxide-simeth (MAALOX/MYLANTA) 200-200-20 MG/5ML suspension 30 mL  30 mL Oral Q4H PRN Money, Darnelle Maffucci B, FNP      . EPINEPHrine (EPI-PEN) injection 0.3 mg  0.3 mg Subcutaneous PRN Money, Lowry Ram, FNP      . hydrOXYzine (ATARAX/VISTARIL) tablet 25 mg  25 mg Oral TID PRN Money, Lowry Ram, FNP      . magnesium hydroxide (MILK OF MAGNESIA) suspension 30 mL  30 mL Oral Daily PRN Money, Lowry Ram, FNP      . traZODone (DESYREL) tablet 50 mg  50 mg Oral QHS PRN Money, Lowry Ram, FNP       PTA Medications: Medications Prior to Admission  Medication Sig Dispense Refill Last Dose  . EPINEPHrine 0.3 mg/0.3 mL IJ SOAJ injection    Unknown at Unknown time  . fluconazole (DIFLUCAN) 150 MG tablet 1 tab po x 1. May repeat in 72 hours if no improvement (Patient not taking: Reported on 11/08/2017) 2 tablet 0 Completed Course at Unknown time  . metroNIDAZOLE (FLAGYL) 500 MG tablet Take 1 tablet (500 mg total) by mouth 2 (two) times daily. (Patient not taking: Reported on 11/08/2017) 14 tablet 0 Completed Course at Unknown time    Musculoskeletal: Strength & Muscle Tone: within normal limits Gait & Station: normal Patient leans: N/A  Psychiatric Specialty Exam: Physical Exam  Constitutional: She appears well-developed and well-nourished.  HENT:  Head: Normocephalic and atraumatic.  Respiratory: Effort normal.  Neurological: She is alert.   Psychiatric:  As above    ROS  Blood pressure 113/65, pulse 100, temperature 98.8 F (37.1 C), temperature source Oral, resp. rate 16, height 5' 3" (1.6 m), weight 93 kg (205 lb), last menstrual period 10/18/2017.Body mass index  is 36.31 kg/m.  General Appearance: Overweight. She has been interactive and engaging with peers prior to interview. Appropriate and pleasant.   Eye Contact:  Good  Speech:  Clear and Coherent and Normal Rate  Volume:  Normal  Mood:  Euthymic  Affect:  Appropriate and Full Range  Thought Process:  Linear  Orientation:  Full (Time, Place, and Person)  Thought Content:  No delusional theme. No preoccupation with violent thoughts. No negative ruminations. No obsession.  No hallucination in any modality.   Suicidal Thoughts:  No  Homicidal Thoughts:  No  Memory:  Immediate;   Good Recent;   Good Remote;   Good  Judgement:  Fair  Insight:  Good  Psychomotor Activity:  Normal  Concentration:  Concentration: Good and Attention Span: Good  Recall:  Good  Fund of Knowledge:  Good  Language:  Good  Akathisia:  Negative  Handed:  3  AIMS (if indicated):     Assets:  Communication Skills Desire for Improvement Financial Resources/Insurance Housing Physical Health Resilience  ADL's:  Intact  Cognition:  WNL  Sleep:       Treatment Plan Summary: Patient is not pervasively depressed. She is not manic or psychotic. She coped negatively with her recent stressor. She is processing her behavior. She has future oriented plans. She is no longer having any self harming thoughts. There is no indication for pharmacotherapy at this time. We would evaluate her further and gather more information from her family.  Psychiatric: Impulse Control Disorder Adjustment Disorder  Medical: Obesity  Psychosocial:  Academic difficulties.   PLAN: 1. Monitor mood, behavior and interaction with peers 2. Encourage unit groups and activities 3. SW would gather collateral  from her family   Observation Level/Precautions:  15 minute checks  Laboratory:    Psychotherapy:    Medications:    Consultations:    Discharge Concerns:    Estimated LOS:  Other:     Physician Treatment Plan for Primary Diagnosis: <principal problem not specified> Long Term Goal(s): Improvement in symptoms so as ready for discharge  Short Term Goals: Ability to identify changes in lifestyle to reduce recurrence of condition will improve, Ability to verbalize feelings will improve, Ability to disclose and discuss suicidal ideas, Ability to demonstrate self-control will improve, Ability to identify and develop effective coping behaviors will improve, Ability to maintain clinical measurements within normal limits will improve and Compliance with prescribed medications will improve  Physician Treatment Plan for Secondary Diagnosis: Active Problems:   MDD (major depressive disorder), severe ([place])  Long Term Goal(s): Improvement in symptoms so as ready for discharge  Short Term Goals: Ability to identify changes in lifestyle to reduce recurrence of condition will improve, Ability to verbalize feelings will improve, Ability to disclose and discuss suicidal ideas, Ability to demonstrate self-control will improve, Ability to identify and develop effective coping behaviors will improve, Ability to maintain clinical measurements within normal limits will improve and Compliance with prescribed medications will improve  I certify that inpatient services furnished can reasonably be expected to improve the patient's condition.    Artist Beach, MD 12/21/20184:13 PM

## 2017-11-09 NOTE — BHH Suicide Risk Assessment (Signed)
[facility] Admission Suicide Risk Assessment   Nursing information obtained from:    Demographic factors:    Current Mental Status:    Loss Factors:    Historical Factors:    Risk Reduction Factors:     Total Time spent with patient: 30 minutes Principal Problem: Impulse control disorder Diagnosis:   Patient Active Problem List   Diagnosis Date Noted  . MDD (major depressive disorder), severe ([place]) [F32.2] 11/09/2017  . Impulse control disorder [F63.9] 11/09/2017  . Vaginal irritation [N89.8] 09/14/2017  . Bacterial vaginosis [N76.0, B96.89] 08/09/2017  . Chest pain [R07.9] 08/09/2017  . Ingrown hair [L73.1] 08/09/2017  . Sickle cell trait ([place]) [D57.3] 06/07/2017  . Allergic rhinitis [J30.9] 05/08/2014  . Obesity (BMI 30-39.9) [E66.9] 11/04/2013  . Acne [L70.9] 11/04/2013  . Presence of subdermal contraceptive device [Z97.5] 05/04/2013  . 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 No past history of mental illness. Presented to the hospital via the police. Sent text to her friends expressing suicidal thoughts. Her friends alerted the police.  Patient inflicted multiple superficial lacerations on flexor surface of her left wrist. Reports that her main stressor is recent poor grades. Routine labs normal, toxicology is negative,  UDS negative , BAL 10mg /dl. No past suicidal behavior, no family history of suicide, no evidence of psychosis. No evidence of mania. No cognitive impairment. No access to weapons. She is cooperative with care. She has agreed to treatment recommendations. She has agreed to communicate suicidal thoughts of with staff if the thoughts becomes overwhelming.      Continued Clinical Symptoms:  Alcohol Use Disorder Identification Test Final Score (AUDIT): 2 The "Alcohol Use Disorders Identification Test", Guidelines for Use in Primary Care, Second Edition.  World Pharmacologist [facility]). Score between 0-7:  no or  low risk or alcohol related problems. Score between 8-15:  moderate risk of alcohol related problems. Score between 16-19:  high risk of alcohol related problems. Score 20 or above:  warrants further diagnostic evaluation for alcohol dependence and treatment.   CLINICAL FACTORS:   Impulse control disorder   Musculoskeletal: Strength & Muscle Tone: within normal limits Gait & Station: normal Patient leans: N/A  Psychiatric Specialty Exam: Physical Exam  ROS  Blood pressure 113/65, pulse 100, temperature 98.8 F (37.1 C), temperature source Oral, resp. rate 16, height 5\' 3"  (1.6 m), weight 93 kg (205 lb), last menstrual period 10/18/2017.Body mass index is 36.31 kg/m.  General Appearance: As in H&P  Eye Contact:    Speech:    Volume:    Mood:    Affect:    Thought Process:    Orientation:    Thought Content:    Suicidal Thoughts:    Homicidal Thoughts:  As in H&P  Memory:    Judgement:    Insight:    Psychomotor Activity:    Concentration:    Recall:    Fund of Knowledge:    Language:  As in H&P  Akathisia:    Handed:    AIMS (if indicated):     Assets:    ADL's:    Cognition:  As in H&P  Sleep:         COGNITIVE FEATURES THAT CONTRIBUTE TO RISK:  None    SUICIDE RISK:   Minimal: No identifiable suicidal ideation.  Patients presenting with no risk factors but with morbid ruminations; may be classified as minimal risk based on the severity of the depressive symptoms  PLAN OF CARE:  As in H&P  I certify that inpatient services furnished can reasonably be expected to improve the patient's condition.   Artist Beach, MD 11/09/2017, 4:35 PM

## 2017-11-09 NOTE — Progress Notes (Addendum)
Recreation Therapy Notes  Date: 11/09/17 Time: 0930 Location: 300 [address] Dayroom  Group Topic: Stress Management  Goal Area(s) Addresses:  Patient will verbalize importance of using healthy stress management.  Patient will identify positive emotions associated with healthy stress management.   Intervention: Stress Management  Activity :  Progressive Muscle Relaxation.  LRT read a script to lead patients through the stress management technique of progressive muscle relaxation.  Patients were to follow along to engage in the activity as LRT read the script.  Education:  Stress Management, Discharge Planning.   Education Outcome: Acknowledges edcuation/In group clarification offered/Needs additional education  Clinical Observations/Feedback: Pt did not attend group.    Victorino Sparrow, LRT/CTRS          Victorino Sparrow A 11/09/2017 11:16 AM
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
## 2017-11-09 NOTE — Progress Notes (Signed)
Pt has been accepted to [facility] [id number]. Attending physician will be Dr. Parke Poisson, MD. Per [facility] bed is available now. Call to report [phone]. TCU nurse notified of acceptance to [facility].   Lind Covert, MSW, LCSW Therapeutic Triage Specialist  [phone]

## 2017-11-09 NOTE — Tx Team (Signed)
Interdisciplinary Treatment and Diagnostic Plan Update  11/09/2017 Time of Session: 2:53 PM  Christy Barnes MRN: [id number]  Principal Diagnosis: <principal problem not specified>  Secondary Diagnoses: Active Problems:   MDD (major depressive disorder), severe (HCC)   Current Medications:  Current Facility-Administered Medications  Medication Dose Route Frequency Provider Last Rate Last Dose  . acetaminophen (TYLENOL) tablet 650 mg  650 mg Oral Q6H PRN Money, Lowry Ram, FNP      . alum & mag hydroxide-simeth (MAALOX/MYLANTA) 200-200-20 MG/5ML suspension 30 mL  30 mL Oral Q4H PRN Money, Darnelle Maffucci B, FNP      . EPINEPHrine (EPI-PEN) injection 0.3 mg  0.3 mg Subcutaneous PRN Money, Lowry Ram, FNP      . hydrOXYzine (ATARAX/VISTARIL) tablet 25 mg  25 mg Oral TID PRN Money, Lowry Ram, FNP      . magnesium hydroxide (MILK OF MAGNESIA) suspension 30 mL  30 mL Oral Daily PRN Money, Lowry Ram, FNP      . traZODone (DESYREL) tablet 50 mg  50 mg Oral QHS PRN Money, Lowry Ram, FNP        PTA Medications: Medications Prior to Admission  Medication Sig Dispense Refill Last Dose  . EPINEPHrine 0.3 mg/0.3 mL IJ SOAJ injection    Unknown at Unknown time  . fluconazole (DIFLUCAN) 150 MG tablet 1 tab po x 1. May repeat in 72 hours if no improvement (Patient not taking: Reported on 11/08/2017) 2 tablet 0 Completed Course at Unknown time  . metroNIDAZOLE (FLAGYL) 500 MG tablet Take 1 tablet (500 mg total) by mouth 2 (two) times daily. (Patient not taking: Reported on 11/08/2017) 14 tablet 0 Completed Course at Unknown time    Patient Stressors: Educational concerns Marital or family conflict  Patient Strengths: Ability for insight Average or above average intelligence Capable of independent living Curator fund of knowledge Motivation for treatment/growth Physical Health Supportive family/friends  Treatment Modalities: Medication Management, Group therapy, Case management,  1 to  1 session with clinician, Psychoeducation, Recreational therapy.   Physician Treatment Plan for Primary Diagnosis: <principal problem not specified> Long Term Goal(s): Improvement in symptoms so as ready for discharge  Short Term Goals:    Medication Management: Evaluate patient's response, side effects, and tolerance of medication regimen.  Therapeutic Interventions: 1 to 1 sessions, Unit Group sessions and Medication administration.  Evaluation of Outcomes: Progressing  Physician Treatment Plan for Secondary Diagnosis: Active Problems:   MDD (major depressive disorder), severe ([place])   Long Term Goal(s): Improvement in symptoms so as ready for discharge  Short Term Goals:    Medication Management: Evaluate patient's response, side effects, and tolerance of medication regimen.  Therapeutic Interventions: 1 to 1 sessions, Unit Group sessions and Medication administration.  Evaluation of Outcomes: Progressing   RN Treatment Plan for Primary Diagnosis: <principal problem not specified> Long Term Goal(s): Knowledge of disease and therapeutic regimen to maintain health will improve  Short Term Goals: Ability to identify and develop effective coping behaviors will improve and Compliance with prescribed medications will improve  Medication Management: RN will administer medications as ordered by provider, will assess and evaluate patient's response and provide education to patient for prescribed medication. RN will report any adverse and/or side effects to prescribing provider.  Therapeutic Interventions: 1 on 1 counseling sessions, Psychoeducation, Medication administration, Evaluate responses to treatment, Monitor vital signs and CBGs as ordered, Perform/monitor CIWA, COWS, AIMS and Fall Risk screenings as ordered, Perform wound care treatments as ordered.  Evaluation of Outcomes:  Progressing   LCSW Treatment Plan for Primary Diagnosis: <principal problem not specified> Long Term  Goal(s): Safe transition to appropriate next level of care at discharge, Engage patient in therapeutic group addressing interpersonal concerns.  Short Term Goals: Engage patient in aftercare planning with referrals and resources  Therapeutic Interventions: Assess for all discharge needs, 1 to 1 time with Social worker, Explore available resources and support systems, Assess for adequacy in community support network, Educate family and significant other(s) on suicide prevention, Complete Psychosocial Assessment, Interpersonal group therapy.  Evaluation of Outcomes: Met  Return home, follow up outpt   Progress in Treatment: Attending groups: Yes Participating in groups: Yes Taking medication as prescribed: Yes Toleration medication: Yes, no side effects reported at this time Family/Significant other contact made: No Patient understands diagnosis: Yes AEB asking for help with expression of feelings in appropriate ways Discussing patient identified problems/goals with staff: Yes Medical problems stabilized or resolved: Yes Denies suicidal/homicidal ideation: Yes Issues/concerns per patient self-inventory: None Other: N/A  New problem(s) identified: None identified at this time.   New Short Term/Long Term Goal(s):"I want to be more open, to express my feelings and to talk. I can't keep this in."  Discharge Plan or Barriers:   Reason for Continuation of Hospitalization: Anxiety  Depression  Medication stabilization   Estimated Length of Stay: 12/26  Attendees: Patient: Christy Barnes 11/09/2017  2:53 PM  Physician: Leanord Hawking, MD 11/09/2017  2:53 PM  Nursing: Sena Hitch, RN 11/09/2017  2:53 PM  RN Care Manager: Lars Pinks, RN 11/09/2017  2:53 PM  Social Worker: Ripley Fraise 11/09/2017  2:53 PM  Recreational Therapist: Winfield Cunas 11/09/2017  2:53 PM  Other: Norberto Sorenson 11/09/2017  2:53 PM  Other:  11/09/2017  2:53 PM    Scribe for Treatment  Team:  Roque Lias LCSW 11/09/2017 2:53 PM
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
## 2017-11-09 NOTE — Progress Notes (Signed)
Voluntary paperwork signed and faxed to [facility].  Lind Covert, MSW, LCSW Therapeutic Triage Specialist  [phone]

## 2017-11-09 NOTE — Progress Notes (Signed)
Nursing Progress Note: 7p-7a D: Pt currently presents with a pleasant/silly affect and behavior. Pt states "I feel great now. I just had a lapse in judgement. I didn't mean what I said to my friends. I wasn't going to hurt myself I was just really upset." Interacting appropriately with the milieu. Pt reports good sleep during the previous night with current medication regimen. Pt did attend wrap-up group.  A: Pt provided with medications per providers orders. Pt's labs and vitals were monitored throughout the night. Pt supported emotionally and encouraged to express concerns and questions. Pt educated on medications.  R: Pt's safety ensured with 15 minute and environmental checks. Pt currently denies SI, HI, and AVH. Pt verbally contracts to seek staff if SI,HI, or AVH occurs and to consult with staff before acting on any harmful thoughts. Will continue to monitor.
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
## 2017-11-09 NOTE — Progress Notes (Signed)
TCU nurse Margaretha Sheffield, RN called to advise TTS the pt's father has requested to be contacted per chart. TTS has not obtained consent from the pt to speak with her father. TTS is unable to speak with the pt's father without consent. Pt is currently sleeping.  Lind Covert, MSW, LCSW Therapeutic Triage Specialist  [phone]

## 2017-11-09 NOTE — Plan of Care (Signed)
D: Christy Barnes has been interacting with peers in the dayroom. She denies SI and contracts for safety. She also denies any other immediate needs or questions.  A: No scheduled meds to be given as of yet. No PRNs requested or required. Q15 safety checks maintained. Support/encouragement offered.  R: Pt remains free from harm and continues with treatment. Will continue to monitor for needs/safety.

## 2017-11-09 NOTE — Tx Team (Signed)
Initial Treatment Plan 11/09/2017 11:28 AM [name] JHH:[id number]    PATIENT STRESSORS: Educational concerns Marital or family conflict   PATIENT STRENGTHS: Ability for insight Average or above average intelligence Capable of independent living Communication skills General fund of knowledge Motivation for treatment/growth Physical Health Supportive family/friends   PATIENT IDENTIFIED PROBLEMS: "I want to be more open, to express my feelings and to talk. I can't keep this in."  Depression  Anxiety                 DISCHARGE CRITERIA:  Ability to meet basic life and health needs Adequate post-discharge living arrangements Improved stabilization in mood, thinking, and/or behavior Verbal commitment to aftercare and medication compliance  PRELIMINARY DISCHARGE PLAN: Outpatient therapy  PATIENT/FAMILY INVOLVEMENT: This treatment plan has been presented to and reviewed with the patient, Christy Barnes.  The patient and family have been given the opportunity to ask questions and make suggestions.  Marya Landry, RN 11/09/2017, 11:28 AM

## 2017-11-10 MED ORDER — EPINEPHRINE 0.3 MG/0.3ML IJ SOAJ: 0.3000 mg | INTRAMUSCULAR | 1 refills | Status: AC | PRN

## 2017-11-10 MED ORDER — HYDROXYZINE HCL 25 MG PO TABS
25.0000 mg | ORAL_TABLET | Freq: Three times a day (TID) | ORAL | 0 refills | Status: DC | PRN
Start: 2017-11-10 — End: 2017-11-28

## 2017-11-10 MED ORDER — TRAZODONE HCL 50 MG PO TABS: 50.0000 mg | ORAL_TABLET | Freq: Every evening | ORAL | 0 refills | Status: DC | PRN

## 2017-11-10 NOTE — Progress Notes (Signed)
Patient ID: ANEESAH HERNAN, female   DOB: May 13, 1996, 21 y.o.   MRN: [id number]    Pt reported being ready for discharge, she was discharged home with family. Pt reported that she was negative SI/HI, no AH/VH noted. Pt was given her discharge instructions and prescriptions, no issues or concerns noted.

## 2017-11-10 NOTE — Progress Notes (Signed)
Adult Psychoeducational Group Note  Date:  11/10/2017 Time:  4:36 AM  Group Topic/Focus:  Wrap-Up Group:   The focus of this group is to help patients review their daily goal of treatment and discuss progress on daily workbooks.  Participation Level:  Active  Participation Quality:  Appropriate  Affect:  Appropriate  Cognitive:  Appropriate  Insight: Appropriate  Engagement in Group:  Engaged  Modes of Intervention:  Discussion  Additional Comments:   Pt is a new admission. Pt stated her goal for today was to meet and interact with peers and staff. Pt stated she felt like she accomplished her goal. Pt rated her over all day a 9 out of 10. Pt stated her visited with her family today improved her day.   Candy Sledge 11/10/2017, 4:36 AM

## 2017-11-10 NOTE — BHH Group Notes (Signed)
LCSW Group Therapy Note  Date/Time:  11/10/2017   10:00AM-11:00AM  Type of Therapy and Topic:  Group Therapy:  Fears and Unhealthy/Healthy Coping Skills  Participation Level:  Minimal   Description of Group:  The focus of this group was to discuss some of the prevalent fears that patients experience, and to identify the commonalities among group members.  A fun exercise was used to initiate the discussion, followed by writing on the white board a group-generated list of unhealthy coping and healthy coping techniques to deal with each fear.    Therapeutic Goals: 1. Patient will be able to distinguish between healthy and unhealthy coping skills 2. Patient will identify and describe 3 fears they experience 3. Patient will identify one positive coping strategy for each fear they experience 4. Patient will respond empathetically to peers' statements regarding fears they experience  Summary of Patient Progress:  The patient expressed that she will spent the holidays with her parents and nieces and nephews.  For the remainder of group she closed her eyes and dozed.  Therapeutic Modalities Cognitive Behavioral Therapy Motivational Interviewing  Selmer Dominion, LCSW

## 2017-11-10 NOTE — BHH Suicide Risk Assessment (Signed)
[facility] Discharge Suicide Risk Assessment   Principal Problem: Impulse control disorder Discharge Diagnoses:  Patient Active Problem List   Diagnosis Date Noted  . MDD (major depressive disorder), severe ([place]) [F32.2] 11/09/2017  . Impulse control disorder [F63.9] 11/09/2017  . Vaginal irritation [N89.8] 09/14/2017  . Bacterial vaginosis [N76.0, B96.89] 08/09/2017  . Chest pain [R07.9] 08/09/2017  . Ingrown hair [L73.1] 08/09/2017  . Sickle cell trait ([place]) [D57.3] 06/07/2017  . Allergic rhinitis [J30.9] 05/08/2014  . Obesity (BMI 30-39.9) [E66.9] 11/04/2013  . Acne [L70.9] 11/04/2013  . Presence of subdermal contraceptive device [Z97.5] 05/04/2013  . Hair loss [L65.9] 04/16/2013    Total Time spent with patient: 30 minutes  Musculoskeletal: Strength & Muscle Tone: within normal limits Gait & Station: normal Patient leans: N/A  Psychiatric Specialty Exam: Review of Systems  Constitutional: Negative.   HENT: Negative.   Eyes: Negative.   Respiratory: Negative.   Cardiovascular: Negative.   Gastrointestinal: Negative.   Genitourinary: Negative.   Musculoskeletal: Negative.   Skin: Negative.   Neurological: Negative.   Endo/Heme/Allergies: Negative.   Psychiatric/Behavioral: Negative for depression, hallucinations, memory loss, substance abuse and suicidal ideas. The patient is not nervous/anxious and does not have insomnia.     Blood pressure 113/65, pulse 100, temperature 98.8 F (37.1 C), temperature source Oral, resp. rate 16, height 5\' 3"  (1.6 m), weight 93 kg (205 lb), last menstrual period 10/18/2017.Body mass index is 36.31 kg/m.  General Appearance: Neatly dressed, pleasant, engaging well and cooperative. Appropriate behavior. Not in any distress. Good relatedness. Not internally stimulated  Eye Contact::  Good  Speech:  Spontaneous, normal prosody. Normal tone and rate.   Volume:  Normal  Mood:  Euthymic  Affect:  Appropriate and Full Range  Thought Process:   Linear  Orientation:  Full (Time, Place, and Person)  Thought Content:  Future oriented. No delusional theme. No preoccupation with violent thoughts. No negative ruminations. No obsession.  No hallucination in any modality.   Suicidal Thoughts:  No  Homicidal Thoughts:  No  Memory:  Immediate;   Good Recent;   Good Remote;   Good  Judgement:  Good  Insight:  Good  Psychomotor Activity:  Normal  Concentration:  Good  Recall:  Good  Fund of Knowledge:Good  Language: Good  Akathisia:  Negative  Handed:    AIMS (if indicated):     Assets:  Communication Skills Desire for [place] Talents/Skills Vocational/Educational  Sleep:  Number of Hours: 6.25  Cognition: WNL  ADL's:  Intact   Clinical  Assessment::   21 y.o AAF, single, Buyer, retail at [employer]. No past history of mental illness. Presented to the hospital via the police. Sent text to her friends expressing suicidal thoughts. Her friends alerted the police.  Patient inflicted multiple superficial lacerations on flexor surface of her left wrist. Reports that her main stressor is recent poor grades. Routine labs normal, toxicology is negative,  UDS negative , BAL 10mg /dl.  Seen today. Reports that she is in good spirits. Says she regrets her impulsive act. She has no intention of harming herself anymore. Says she has been in contact with her parents. No new crisis created by this . She is looking forward to be home with them. She slept well last night. She has normal appetite and energy. She is able to think clearly. She is able to focus on task. Her thoughts are not crowded or racing. No evidence of mania. No hallucination in any modality.  She is not making any delusional statement. No passivity of will/thought. She is fully in touch with reality. No thoughts of suicide. No thoughts of homicide. No violent thoughts. No overwhelming anxiety. No access to weapons.  I spoke to her  father. He feels comfortable with discharge plan. No concerns about dangerousness. He would be picking her up.  Nursing staff reports that patient has been appropriate on the unit. Patient has been interacting well with peers. No behavioral issues. Patient has not voiced any suicidal thoughts. Patient has not been observed to be internally stimulated.   Patient was discussed at team. Team members feels that patient is back to her baseline level of function. Team agrees with plan to discharge patient today.   Demographic Factors:  NA  Loss Factors: Academic difficulties.   Historical Factors: NA  Risk Reduction Factors:   Sense of responsibility to family, Living with another person, especially a relative, Positive social support, Positive therapeutic relationship and Positive coping skills or problem solving skills  Continued Clinical Symptoms:  As above  Cognitive Features That Contribute To Risk:  None    Suicide Risk:  Minimal: No identifiable suicidal ideation.  Patient is not having any thoughts of suicide at this time. Modifiable risk factors targeted during this admission includes impulse control disorder and adjustment disorder. Demographical and historical risk factors cannot be modified. Patient is now engaging well. Patient is reliable and is future oriented. We have buffered patient's support structures. At this point, patient is at low risk of suicide. Patient is aware of the effects of psychoactive substances on decision making process. Patient has been provided with emergency contacts. Patient acknowledges to use resources provided if unforseen circumstances changes their current risk stratification.    Follow-up Information    [facility] Follow up.   Why:  They will reopen again on January 2nd.  Please walk-in after 9AM that morning, or call to set up an appointment for your hospital follow-up Contact information: [place]  [place] recommendations:  1. Mental health follow up 2. Discharge in care of her family    Artist Beach, MD 11/10/2017, 1:56 PM

## 2017-11-10 NOTE — BHH Group Notes (Signed)
[place] Group Notes:  (Nursing/MHT/Case Management/Adjunct)  Date:  11/10/2017  Time:  1330  Type of Therapy:  Nurse Education - Managing Negative Self Talk  Participation Level:  Active  Participation Quality:  Attentive  Affect:  Tearful  Cognitive:  Alert  Insight:  Good  Engagement in Group:  Engaged  Modes of Intervention:  Discussion and Education  Summary of Progress/Problems: Patient educated on ways to "rewrite" negative self talk into positive statements. Patient became tearful during discussion however stated, "it needs to be talked about. It's okay."   Loletta Specter [facility] 11/10/2017, 1415

## 2017-11-10 NOTE — Progress Notes (Signed)
  [facility] Adult Case Management Discharge Plan :  Will you be returning to the same living situation after discharge:  Yes,  with parents At discharge, do you have transportation home?: Yes,  arranged by patient Do you have the ability to pay for your medications: Yes,  no barriers identified  Release of information consent forms completed and in the chart;  Patient's signature needed at discharge.  Patient to Follow up at: Follow-up Information    [facility] Follow up.   Why:  They will reopen again on January 2nd.  Please walk-in after 9AM that morning, or call to set up an appointment for your hospital follow-up Contact information: [address] [facility]  [phone]          Next level of care provider has access to [place] and Suicide Prevention discussed: Yes,  with father  Have you used any form of tobacco in the last 30 days? (Cigarettes, Smokeless Tobacco, Cigars, and/or Pipes): No  Has patient been referred to the Quitline?: Patient refused referral  Patient has been referred for addiction treatment: N/A  Maretta Los, LCSW 11/10/2017, 1:22 PM

## 2017-11-10 NOTE — Discharge Summary (Signed)
Physician Discharge Summary Note  Patient:  Christy Barnes is an 21 y.o., female MRN:  [id number] DOB:  June 17, 1996 Patient phone:  [phone] (home)  Patient address:   [address] [place] [address],  Total Time spent with patient: 45 minutes  Date of Admission:  11/09/2017 Date of Discharge: 11/10/2017  Reason for Admission:   21 y.o AAF, single, premedical student at [employer]. No past history of mental illness. Presented to the hospital via the police. Sent text to her friends expressing suicidal thoughts. Her friends alerted the police.  Patient inflicted multiple superficial lacerations on flexor surface of her left wrist. Reports that her main stressor is recent poor grades. Routine labs normal, toxicology is negative,  UDS negative , BAL 10mg /dl.  At interview, patient reports high expectations from her family. Says process of graduating, internship and getting back into grad school has been stressful. Says she has been living with her parents since she started pre medical school. Patient states that she also decided to take a part time job. Says studying and working has been difficult to combine. Says she recently found out her grades was not as good as she would want them to be. Says she instantly felt depressed. She felt hopeless and worthless. Says she just did not know how she was going to explain that to her parents. Patient says she then sent the messages and acted impulsively. Patient regrets her actions. Denies any lingering suicidal thoughts. Says she has decided she would give up work from next semester. Patient denies any depression prior to finding out about her grades. She denies any lingering depression now. Says she is optimistic. Reports normal appetite and energy. Reports normal thinking process. Reports being motivated to do better. No evidence of psychosis. No evidence of mania. No current thoughts of suicide. No thoughts of violence. No homicidal thoughts. No  substance use. No access to weapons.    Principal Problem: Impulse control disorder Discharge Diagnoses: Patient Active Problem List   Diagnosis Date Noted  . MDD (major depressive disorder), severe ([place]) [F32.2] 11/09/2017  . Impulse control disorder [F63.9] 11/09/2017  . Vaginal irritation [N89.8] 09/14/2017  . Bacterial vaginosis [N76.0, B96.89] 08/09/2017  . Chest pain [R07.9] 08/09/2017  . Ingrown hair [L73.1] 08/09/2017  . Sickle cell trait ([place]) [D57.3] 06/07/2017  . Allergic rhinitis [J30.9] 05/08/2014  . Obesity (BMI 30-39.9) [E66.9] 11/04/2013  . Acne [L70.9] 11/04/2013  . Presence of subdermal contraceptive device [Z97.5] 05/04/2013  . Hair loss [L65.9] 04/16/2013    Past Psychiatric History: See H&P  Past Medical History:  Past Medical History:  Diagnosis Date  . Labial tear freshman year  . Overweight and obesity(278.0)     Past Surgical History:  Procedure Laterality Date  . labia repair  freshman year   Family History:  Family History  Problem Relation Age of Onset  . Thyroid disease Mother    Family Psychiatric  History: see H&P Social History:  Social History   Substance and Sexual Activity  Alcohol Use Yes  . Alcohol/week: 0.6 oz  . Types: 1 Glasses of wine per week   Comment: "socially"     Social History   Substance and Sexual Activity  Drug Use No    Social History   Socioeconomic History  . Marital status: Single    Spouse name: None  . Number of children: None  . Years of education: None  . Highest education level: None  Social Needs  . Financial resource strain: None  .  Food insecurity - worry: None  . Food insecurity - inability: None  . Transportation needs - medical: None  . Transportation needs - non-medical: None  Occupational History  . None  Tobacco Use  . Smoking status: Never Smoker  . Smokeless tobacco: Never Used  Substance and Sexual Activity  . Alcohol use: Yes    Alcohol/week: 0.6 oz    Types: 1 Glasses  of wine per week    Comment: "socially"  . Drug use: No  . Sexual activity: Not Currently    Birth control/protection: Implant    Comment: 2 weeks ago protected.Marland Kitchen 05-02-16 Unprotected 1 month ago   Other Topics Concern  . None  Social History Narrative  . None    Hospital Course:   Christy Barnes was admitted for Impulse control disorder , and crisis management.  Pt was treated discharged with the medications listed below under Medication List.  Medical problems were identified and treated as needed.  Home medications were restarted as appropriate.  Improvement was monitored by observation and Christy Barnes 's daily report of symptom reduction.  Emotional and mental status was monitored by daily self-inventory reports completed by Christy Barnes and clinical staff.         Christy Barnes was evaluated by the treatment team for stability and plans for continued recovery upon discharge. Christy Barnes 's motivation was an integral factor for scheduling further treatment. Employment, transportation, bed availability, health status, family support, and any pending legal issues were also considered during hospital stay. Pt was offered further treatment options upon discharge including but not limited to Residential, Intensive Outpatient, and Outpatient treatment.  Christy Barnes will follow up with the services as listed below under Follow Up Information.     Upon completion of this admission the patient was both mentally and medically stable for discharge denying suicidal/homicidal ideation, auditory/visual/tactile hallucinations, delusional thoughts and paranoia.    Christy Barnes responded well to treatment with Vistaril, trazodone without adverse effects. Pt demonstrated improvement without reported or observed adverse effects to the point of stability appropriate for outpatient management. Reviewed CBC, CMP, BAL, and UDS; all unremarkable aside from noted exceptions.     Physical Findings: AIMS: Facial and Oral Movements Muscles of Facial Expression: None, normal Lips and Perioral Area: None, normal Jaw: None, normal Tongue: None, normal,Extremity Movements Upper (arms, wrists, hands, fingers): None, normal Lower (legs, knees, ankles, toes): None, normal, Trunk Movements Neck, shoulders, hips: None, normal, Overall Severity Severity of abnormal movements (highest score from questions above): None, normal Incapacitation due to abnormal movements: None, normal Patient's awareness of abnormal movements (rate only patient's report): No Awareness, Dental Status Current problems with teeth and/or dentures?: No Does patient usually wear dentures?: No  CIWA:    COWS:     Musculoskeletal: Strength & Muscle Tone: within normal limits Gait & Station: normal Patient leans: N/A  Psychiatric Specialty Exam: Physical Exam  ROS  Blood pressure 113/65, pulse 100, temperature 98.8 F (37.1 C), temperature source Oral, resp. rate 16, Barnes 5\' 3"  (1.6 m), weight 93 kg (205 lb), last menstrual period 10/18/2017.Body mass index is 36.31 kg/m.  SEE MD PSE WITHIN SRA  Have you used any form of tobacco in the last 30 days? (Cigarettes, Smokeless Tobacco, Cigars, and/or Pipes): No  Has this patient used any form of tobacco in the last 30 days? (Cigarettes, Smokeless Tobacco, Cigars, and/or Pipes) NO  Blood Alcohol level:  Lab Results  Component Value Date  ETH <10 29/93/7169    Metabolic Disorder Labs:  Lab Results  Component Value Date   HGBA1C 5.2 06/07/2017   MPG 105 06/25/2015   No results found for: PROLACTIN Lab Results  Component Value Date   CHOL 153 06/25/2015   TRIG 39 11/04/2013   HDL 69 06/25/2015   CHOLHDL 3.0 11/04/2013   VLDL 8 11/04/2013   LDLCALC 119 (H) 11/04/2013    See Psychiatric Specialty Exam and Suicide Risk Assessment completed by Attending Physician prior to discharge.  Discharge destination:  Home  Is patient on  multiple antipsychotic therapies at discharge:  No   Has Patient had three or more failed trials of antipsychotic monotherapy by history:  No  Recommended Plan for Multiple Antipsychotic Therapies: NA   Allergies as of 11/10/2017      Reactions   Fish Allergy    Peanut-containing Drug Products    Shrimp [shellfish Allergy]       Medication List    STOP taking these medications   fluconazole 150 MG tablet Commonly known as:  DIFLUCAN   metroNIDAZOLE 500 MG tablet Commonly known as:  FLAGYL     TAKE these medications     Indication  EPINEPHrine 0.3 mg/0.3 mL So[place] injection Commonly known as:  EPI-PEN Inject 0.3 mLs (0.3 mg total) into the skin as needed (Anaphylaxis). What changed:    how much to take  how to take this  when to take this  reasons to take this  Indication:  Life-Threatening Hypersensitivity Reaction   hydrOXYzine 25 MG tablet Commonly known as:  ATARAX/VISTARIL Take 1 tablet (25 mg total) by mouth 3 (three) times daily as needed for anxiety.  Indication:  Feeling Anxious   traZODone 50 MG tablet Commonly known as:  DESYREL Take 1 tablet (50 mg total) by mouth at bedtime as needed for sleep.  Indication:  [facility] Follow up.   Why:  They will reopen again on January 2nd.  Please walk-in after 9AM that morning, or call to set up an appointment for your hospital follow-up Contact information: [address] [facility]  [phone]          Follow-up recommendations:  Activity:  As tolerated Diet:  Heart healthy with low sodium.  Comments:  Take all medications as prescribed. Keep all follow-up appointments as scheduled.  Do not consume alcohol or use illegal drugs while on prescription medications. Report any adverse effects from your medications to your primary care provider promptly.  In the event of recurrent symptoms or worsening symptoms, call 911, a crisis  hotline, or go to the nearest emergency department for evaluation.    Signed: Benjamine Mola, FNP 11/10/2017, 10:55 AM

## 2017-11-10 NOTE — BHH Suicide Risk Assessment (Signed)
[place] INPATIENT:  Family/Significant Other Suicide Prevention Education  Suicide Prevention Education:  Education Completed;  father Mr Kraszewski [phone] has been identified by the patient as the family member/significant other with whom the patient will be residing, and identified as the person(s) who will aid the patient in the event of a mental health crisis (suicidal ideations/suicide attempt).  With written consent from the patient, the family member/significant other has been provided the following suicide prevention education, prior to the and/or following the discharge of the patient.  NO GUNS ARE IN THE HOME.  FATHER HAS NO CONCERNS ABOUT PT'S DISCHARGE. HE ASKS FOR WRITTEN DOCUMENTATION ABOUT THIS INFORMATION, WHICH WAS PROVIDED.  The suicide prevention education provided includes the following:  Suicide risk factors  Suicide prevention and interventions  National Suicide Hotline telephone number  [facility] assessment telephone number  [facility] Emergency Assistance [place] and/or Residential Mobile Crisis Unit telephone number  Request made of family/significant other to:  Remove weapons (e.g., guns, rifles, knives), all items previously/currently identified as safety concern.    Remove drugs/medications (over-the-counter, prescriptions, illicit drugs), all items previously/currently identified as a safety concern.  The family member/significant other verbalizes understanding of the suicide prevention education information provided.  The family member/significant other agrees to remove the items of safety concern listed above.  Christy Barnes 11/10/2017, 1:21 PM

## 2017-11-26 NOTE — Progress Notes (Signed)
[facility] Phone: [phone]   Date of Visit: 11/28/2017   HPI:  Patient presents with multiple complaints today.  We decided to focus on 3 issues today.  Chest pain: -Patient reports that a few years ago she was stretching and felt a pop in her chest.  She was evaluated for this and the evaluation was unremarkable.  However since then she would get intermittent chest pain. -The chest pain is in the lower central sternal area and does not radiate to any other area.  No associated nausea, vomiting, shortness of breath -Reports that symptoms are worse in the wintertime or when it is cold -Describes a symptom to him as a sharp pain intermittently for a constant achy pain -Recently since the weather has changed she has had more constant pain.  Prior it was more intermittent -Pain is exacerbated with any sudden movements.  Does not worsen with increased activity -She does report that she has heartburn symptoms of as well but this chest pain is different from her heartburn  -Heating pad does help with symptoms -Has not been taking any over-the-counter medications for her symptoms -No family history of heart disease or stroke  Dry throat: -Patient reports of dry throat, nasal congestion for about a month -No cough -She feels like her throat dries up when she talks for a certain period of time and this is new -She had mild throat pain yesterday that resolved -Her ears feel clogged up -No sneezing, watery eyes or itchy eyes -She was on Zyrtec in the past for her allergies and thinks this might be due to her allergies  Pelvic concern: -Reports that for the past month she feels like "something is inside her vagina".  It is difficult for her to describe how she is feeling. -She denies any vaginal discharge, dysuria, urinary frequency -She has regular monthly periods her last menstrual period was on January 31 -She is not sexually active currently -She is on  Nexplanon -She would like to be screened for STI including HIV and syphilis  Depression, impulse control disorder: -Noticed that she was admitted for inpatient psychiatry in December 21-22 for suicidal ideation. -She reports that she is doing okay.  We briefly discussed about this.  She has not started any medications that were prescribed to her including trazodone.  She feels like she would like to manage her symptoms without medications.  She will start therapy tomorrow at [facility].  Her gad 7 score is 10, somewhat difficult.  Her PHQ 9 score of 4, somewhat difficult with negative answer for question 9  ROS: See HPI.  [place]:  PMH: Allergic Rhinitis Obesity  Sickle Cell Trait  MDD  Impulse Control DO   PHYSICAL EXAM: BP 110/64   Pulse 78   Temp 98.9 F (37.2 C) (Oral)   Ht 5\' 4"  (1.626 m)   Wt 206 lb (93.4 kg)   SpO2 99%   BMI 35.36 kg/m  GEN: NAD HEENT: Atraumatic, normocephalic, neck supple, EOMI, sclera clear, tympanic membranes are normal bilaterally, mild erythema of the posterior pharynx without any exudates on the tonsils.  No sinus tenderness to palpation.  Nasal turbinates are unremarkable. CV: RRR, no murmurs, rubs, or gallops.  Her chest pain is reproducible to palpation of the right lower sternal border. PULM: CTAB, normal effort ABD: Soft, nontender, nondistended, NABS, no organomegaly Female genitalia: normal external genitalia, vulva, vagina, cervix, uterus and adnexa, some discharge consistent with yeast noted SKIN: No rash or cyanosis; warm  and well-perfused EXTR: No lower extremity edema or calf tenderness PSYCH: Mood and affect euthymic, normal rate and volume of speech NEURO: Awake, alert, no focal deficits grossly, normal speech   ASSESSMENT/PLAN:  Health maintenance: -Tdap given today  Allergic rhinitis I believe her throat symptoms are likely due to her allergies.  Will restart Zyrtec 5 mg nightly.  Unlikely this is an infectious process.  follow-up if  symptoms do not improve.  Chest pain Her chest pain is consistent with costochondritis as it is reproducible to palpation of the chest wall.  Discussed ibuprofen 400 mg every 8 hours as needed for symptoms.  Unlikely this is cardiac related.  Vaginal irritation Her pelvic exam was normal other than some mild discharge that was consistent with yeast.  Her wet prep showed clue cells and yeast.  Nursing called patient to inform.  Flagyl 500 mg twice daily times 7 days and Diflucan 150 mg daily x1 sent to pharmacy.  Counseled patient to avoid alcohol use during treatment with Flagyl.  STI screening tests ordered including HIV and RPR  MDD (major depressive disorder), severe (HCC) No SI or HI.  She did become slightly tearful when we briefly spoke about what happened in December.  She is starting behavioral health therapy tomorrow at [facility].  She does not want to start medications that were prescribed by the psychiatrist.  I asked her to come back in 2-3 weeks to discuss her other concerns at that time we will also reevaluate how she is doing.   Smiley Houseman, MD PGY [place]

## 2017-11-28 ENCOUNTER — Encounter: Payer: Self-pay | Admitting: Internal Medicine

## 2017-11-28 ENCOUNTER — Other Ambulatory Visit: Payer: Self-pay

## 2017-11-28 ENCOUNTER — Other Ambulatory Visit (HOSPITAL_COMMUNITY)
Admission: RE | Admit: 2017-11-28 | Discharge: 2017-11-28 | Disposition: A | Discharge status: Home / Self Care | Payer: BLUE CROSS/BLUE SHIELD | Source: Ambulatory Visit | Attending: Family Medicine | Admitting: Family Medicine

## 2017-11-28 ENCOUNTER — Ambulatory Visit (INDEPENDENT_AMBULATORY_CARE_PROVIDER_SITE_OTHER): Payer: BLUE CROSS/BLUE SHIELD | Admitting: Internal Medicine

## 2017-11-28 ENCOUNTER — Encounter: Payer: Self-pay | Admitting: *Deleted

## 2017-11-28 ENCOUNTER — Telehealth: Payer: Self-pay | Admitting: Internal Medicine

## 2017-11-28 VITALS — BP 110/64 | HR 78 | Temp 98.9°F | Ht 64.0 in | Wt 206.0 lb

## 2017-11-28 DIAGNOSIS — N898 Other specified noninflammatory disorders of vagina: Secondary | ICD-10-CM | POA: Insufficient documentation

## 2017-11-28 DIAGNOSIS — Z6835 Body mass index (BMI) 35.0-35.9, adult: Secondary | ICD-10-CM | POA: Diagnosis not present

## 2017-11-28 DIAGNOSIS — R079 Chest pain, unspecified: Secondary | ICD-10-CM | POA: Diagnosis not present

## 2017-11-28 DIAGNOSIS — F322 Major depressive disorder, single episode, severe without psychotic features: Secondary | ICD-10-CM | POA: Diagnosis not present

## 2017-11-28 DIAGNOSIS — E669 Obesity, unspecified: Secondary | ICD-10-CM | POA: Diagnosis not present

## 2017-11-28 DIAGNOSIS — D573 Sickle-cell trait: Secondary | ICD-10-CM | POA: Diagnosis not present

## 2017-11-28 DIAGNOSIS — Z113 Encounter for screening for infections with a predominantly sexual mode of transmission: Secondary | ICD-10-CM | POA: Diagnosis not present

## 2017-11-28 DIAGNOSIS — J309 Allergic rhinitis, unspecified: Secondary | ICD-10-CM

## 2017-11-28 DIAGNOSIS — Z23 Encounter for immunization: Secondary | ICD-10-CM | POA: Diagnosis not present

## 2017-11-28 DIAGNOSIS — Z114 Encounter for screening for human immunodeficiency virus [HIV]: Secondary | ICD-10-CM

## 2017-11-28 DIAGNOSIS — R0782 Intercostal pain: Secondary | ICD-10-CM | POA: Diagnosis not present

## 2017-11-28 LAB — POCT WET PREP (WET MOUNT)
Clue Cells Wet Prep Whiff POC: NEGATIVE
Trichomonas Wet Prep HPF POC: ABSENT

## 2017-11-28 MED ORDER — METRONIDAZOLE 500 MG PO TABS: 500.0000 mg | ORAL_TABLET | Freq: Two times a day (BID) | ORAL | 0 refills | Status: DC

## 2017-11-28 MED ORDER — FLUCONAZOLE 150 MG PO TABS: 150.0000 mg | ORAL_TABLET | Freq: Every day | ORAL | 0 refills | Status: DC

## 2017-11-28 MED ORDER — CETIRIZINE HCL 5 MG PO TABS: 5.0000 mg | ORAL_TABLET | Freq: Every day | ORAL | 2 refills | Status: DC

## 2017-11-28 NOTE — Assessment & Plan Note (Signed)
No SI or HI.  She did become slightly tearful when we briefly spoke about what happened in December.  She is starting behavioral health therapy tomorrow at [facility].  She does not want to start medications that were prescribed by the psychiatrist.  I asked her to come back in 2-3 weeks to discuss her other concerns at that time we will also reevaluate how she is doing.

## 2017-11-28 NOTE — Telephone Encounter (Signed)
This encounter was created in error - please disregard.

## 2017-11-28 NOTE — Patient Instructions (Addendum)
Your chest pain is consistent with costochondritis. You can take Ibuprofen 200mg  to 400mg  every 8 hours as needed.   Please start zyrtec for your allergy symptoms. Take at bedtime as this medication can make you drowsy.   I will inform you of the results over the phone   Costochondritis Costochondritis is swelling and irritation (inflammation) of the tissue (cartilage) that connects your ribs to your breastbone (sternum). This causes pain in the front of your chest. The pain usually starts gradually and involves more than one rib. What are the causes? The exact cause of this condition is not always known. It results from stress on the cartilage where your ribs attach to your sternum. The cause of this stress could be:  Chest injury (trauma).  Exercise or activity, such as lifting.  Severe coughing.  What increases the risk? You may be at higher risk for this condition if you:  Are female.  Are 22?23 years old.  Recently started a new exercise or work activity.  Have low levels of vitamin D.  Have a condition that makes you cough frequently.  What are the signs or symptoms? The main symptom of this condition is chest pain. The pain:  Usually starts gradually and can be sharp or dull.  Gets worse with deep breathing, coughing, or exercise.  Gets better with rest.  May be worse when you press on the sternum-rib connection (tenderness).  How is this diagnosed? This condition is diagnosed based on your symptoms, medical history, and a physical exam. Your health care provider will check for tenderness when pressing on your sternum. This is the most important finding. You may also have tests to rule out other causes of chest pain. These may include:  A chest X-ray to check for lung problems.  An electrocardiogram (ECG) to see if you have a heart problem that could be causing the pain.  An imaging scan to rule out a chest or rib fracture.  How is this treated? This  condition usually goes away on its own over time. Your health care provider may prescribe an NSAID to reduce pain and inflammation. Your health care provider may also suggest that you:  Rest and avoid activities that make pain worse.  Apply heat or cold to the area to reduce pain and inflammation.  Do exercises to stretch your chest muscles.  If these treatments do not help, your health care provider may inject a numbing medicine at the sternum-rib connection to help relieve the pain. Follow these instructions at home:  Avoid activities that make pain worse. This includes any activities that use chest, abdominal, and side muscles.  If directed, put ice on the painful area: ? Put ice in a plastic bag. ? Place a towel between your skin and the bag. ? Leave the ice on for 20 minutes, 2-3 times a day.  If directed, apply heat to the affected area as often as told by your health care provider. Use the heat source that your health care provider recommends, such as a moist heat pack or a heating pad. ? Place a towel between your skin and the heat source. ? Leave the heat on for 20-30 minutes. ? Remove the heat if your skin turns bright red. This is especially important if you are unable to feel pain, heat, or cold. You may have a greater risk of getting burned.  Take over-the-counter and prescription medicines only as told by your health care provider.  Return to your normal activities  as told by your health care provider. Ask your health care provider what activities are safe for you.  Keep all follow-up visits as told by your health care provider. This is important. Contact a health care provider if:  You have chills or a fever.  Your pain does not go away or it gets worse.  You have a cough that does not go away (is persistent). Get help right away if:  You have shortness of breath. This information is not intended to replace advice given to you by your health care provider. Make  sure you discuss any questions you have with your health care provider. Document Released: 08/16/2005 Document Revised: 05/26/2016 Document Reviewed: 03/01/2016 Elsevier Interactive Patient Education  [employer].

## 2017-11-28 NOTE — Telephone Encounter (Signed)
Of note, patient spoke with blue team nurse Jazmine and was told about test results. Appropriate medication sent to pharmacy.

## 2017-11-28 NOTE — Telephone Encounter (Signed)
Patient called and left message on VM that she was returning call to our office.  Returned call to patient and left message to call our office back.  Nolene Ebbs, RN

## 2017-11-28 NOTE — Telephone Encounter (Signed)
Attempted to call but went to voicemail. Left message to call back.

## 2017-11-28 NOTE — Assessment & Plan Note (Signed)
Her chest pain is consistent with costochondritis as it is reproducible to palpation of the chest wall.  Discussed ibuprofen 400 mg every 8 hours as needed for symptoms.  Unlikely this is cardiac related.

## 2017-11-28 NOTE — Assessment & Plan Note (Signed)
I believe her throat symptoms are likely due to her allergies.  Will restart Zyrtec 5 mg nightly.  Unlikely this is an infectious process.  follow-up if symptoms do not improve.

## 2017-11-28 NOTE — Assessment & Plan Note (Signed)
Her pelvic exam was normal other than some mild discharge that was consistent with yeast.  Her wet prep showed clue cells and yeast.  Nursing called patient to inform.  Flagyl 500 mg twice daily times 7 days and Diflucan 150 mg daily x1 sent to pharmacy.  Counseled patient to avoid alcohol use during treatment with Flagyl.  STI screening tests ordered including HIV and RPR

## 2017-11-29 ENCOUNTER — Telehealth: Payer: Self-pay | Admitting: Internal Medicine

## 2017-11-29 LAB — HIV ANTIBODY (ROUTINE TESTING W REFLEX): HIV Screen 4th Generation wRfx: NONREACTIVE

## 2017-11-29 LAB — CERVICOVAGINAL ANCILLARY ONLY
Chlamydia: NEGATIVE
Neisseria Gonorrhea: NEGATIVE

## 2017-11-29 LAB — RPR: RPR Ser Ql: NONREACTIVE

## 2017-11-29 NOTE — Telephone Encounter (Signed)
Called patient but went to voicemail. Left message reporting general blood work for recent visit was normal. Released on mychart.

## 2017-12-18 ENCOUNTER — Other Ambulatory Visit: Payer: Self-pay | Admitting: Internal Medicine

## 2018-01-21 ENCOUNTER — Encounter: Payer: Self-pay | Admitting: Internal Medicine

## 2018-01-21 ENCOUNTER — Ambulatory Visit (INDEPENDENT_AMBULATORY_CARE_PROVIDER_SITE_OTHER): Payer: BLUE CROSS/BLUE SHIELD | Admitting: Internal Medicine

## 2018-01-21 ENCOUNTER — Other Ambulatory Visit: Payer: Self-pay

## 2018-01-21 VITALS — BP 100/60 | HR 60 | Temp 98.9°F | Wt 207.0 lb

## 2018-01-21 DIAGNOSIS — J029 Acute pharyngitis, unspecified: Secondary | ICD-10-CM

## 2018-01-21 DIAGNOSIS — N898 Other specified noninflammatory disorders of vagina: Secondary | ICD-10-CM

## 2018-01-21 DIAGNOSIS — Z113 Encounter for screening for infections with a predominantly sexual mode of transmission: Secondary | ICD-10-CM | POA: Diagnosis not present

## 2018-01-21 LAB — POCT WET PREP (WET MOUNT)
Clue Cells Wet Prep Whiff POC: POSITIVE
Trichomonas Wet Prep HPF POC: ABSENT

## 2018-01-21 LAB — POCT RAPID STREP A (OFFICE): Rapid Strep A Screen: NEGATIVE

## 2018-01-21 MED ORDER — CETIRIZINE HCL 5 MG PO TABS: 5.0000 mg | ORAL_TABLET | Freq: Every day | ORAL | 6 refills | Status: DC

## 2018-01-21 MED ORDER — METRONIDAZOLE 500 MG PO TABS: 500.0000 mg | ORAL_TABLET | Freq: Two times a day (BID) | ORAL | 0 refills | Status: DC

## 2018-01-21 NOTE — Progress Notes (Signed)
   [facility] Phone: [phone]   Date of Visit: 01/21/2018   HPI:  Sore Throat:  - white patches on the back of the throat since Friday  - mild sore throat which started Friday  - no cough - subjectively feels warm - no specific sick contacts but goes to school  - no rhinorroreha but sneezing at times  - reports she did have oral intercourse (note below)   Vaginal Odor: - odor started this past Saturday. No discharge.  - new sexual partner last week  - condom use for vaginal intercourse, but reports she had oral intercourse as well.  - is on Nexplanon for contraception - LMP was first week of February. No intermenstrual bleeding   ROS: See HPI.  [name]:   PHYSICAL EXAM: BP 100/60   Pulse 60   Temp 98.9 F (37.2 C) (Oral)   Wt 207 lb (93.9 kg)   LMP 12/21/2017   BMI 35.53 kg/m  GEN: NAD HEENT: neck supple without lymphadenopathy, EOMI, sclera clear, oropharynx with small white exudates of the left tonsil.  CV: RRR, no murmurs, rubs, or gallops PULM: CTAB, normal effort ABD: Soft, nontender, nondistended, NABS, no organomegaly Female genitalia: normal external genitalia, vulva, vagina, cervix, uterus and adnexa. Minimal white discharge consistent with physiologic discharge.  SKIN: No rash or cyanosis; warm and well-perfused PSYCH: Mood and affect euthymic, normal rate and volume of speech NEURO: Awake, alert, no focal deficits grossly, normal speech  ASSESSMENT/PLAN:  Sore throat Centor score of 2 for exudates and absence of cough. With history of oral intercourse with new partner, will do throat swab for Gc/Chlamydia. Rapid Strep is negative.  - Rapid Strep A - Cervicovaginal ancillary only  2. Vaginal odor Pelvic exam is unremarkable. Will test for Gc/chlamydia due to new partner. Wet prep with few clue cells. Will treat for BV with Flaygl 500mg  BID x 7 days  - POCT Wet Prep [facility]) - Cervicovaginal ancillary only  3. Screening  examination for sexually transmitted disease - RPR - HIV antibody (with reflex)  Smiley Houseman, MD PGY [place]

## 2018-01-21 NOTE — Patient Instructions (Addendum)
Thank you for coming.   I sent in a refill for your allergy medication.   I will call you about the rest of the results.   Your wet prep showed BV. I sent in Flagyl to the pharmacy

## 2018-01-22 LAB — RPR: RPR Ser Ql: NONREACTIVE

## 2018-01-22 LAB — HIV ANTIBODY (ROUTINE TESTING W REFLEX): HIV Screen 4th Generation wRfx: NONREACTIVE

## 2018-01-23 ENCOUNTER — Telehealth: Payer: Self-pay

## 2018-01-23 NOTE — Telephone Encounter (Signed)
Blue team,  I was waiting for this patient's vaginal and throat gc/chlamydia testing to return. It is not back yet but it has been a few days. Could you please look into this?

## 2018-01-23 NOTE — Telephone Encounter (Signed)
Cytology lab called back to my ext stating the swabs were not labeled on the "out bags" even though they were labeled with the appropriate 'throat" and "cervical" on order req. Will call her to have her come back in.

## 2018-01-23 NOTE — Telephone Encounter (Signed)
It has been less than 48 hrs, result will most likely be ready by tomorrow. Shi Blankenship, Kevin Fenton

## 2018-01-23 NOTE — Telephone Encounter (Signed)
Will check with lab.  Timoty Bourke,CMA

## 2018-01-23 NOTE — Telephone Encounter (Signed)
Pt returned call and apologized and explained the situation. Pt was upset as she is leaving for out of town this evening. Pt will back on Friday and I will be here all day. Agreed with pt to have her call Friday when she returns and I will personally see her. I can do a urine for vaginal and another throat swab. I can put orders in when she arrives.

## 2018-01-23 NOTE — Telephone Encounter (Signed)
Pt called nurse line, would like to speak to MD about her recent lab work. She can be reached at [place], RN

## 2018-01-25 ENCOUNTER — Other Ambulatory Visit (HOSPITAL_COMMUNITY)
Admission: RE | Admit: 2018-01-25 | Discharge: 2018-01-25 | Disposition: A | Discharge status: Home / Self Care | Payer: BLUE CROSS/BLUE SHIELD | Source: Ambulatory Visit | Attending: Family Medicine | Admitting: Family Medicine

## 2018-01-25 ENCOUNTER — Other Ambulatory Visit: Payer: Self-pay

## 2018-01-25 DIAGNOSIS — Z113 Encounter for screening for infections with a predominantly sexual mode of transmission: Secondary | ICD-10-CM

## 2018-01-25 NOTE — Progress Notes (Signed)
Pt presents in clinic today for reswab for stds for a previous lab error. Pt gave a urine sample and I did another throat swab. Orders placed and samples to the lab.

## 2018-01-28 ENCOUNTER — Telehealth: Payer: Self-pay

## 2018-01-28 LAB — CERVICOVAGINAL ANCILLARY ONLY
Chlamydia: NEGATIVE
Neisseria Gonorrhea: NEGATIVE

## 2018-01-28 LAB — URINE CYTOLOGY ANCILLARY ONLY
Chlamydia: NEGATIVE
Neisseria Gonorrhea: NEGATIVE
Trichomonas: NEGATIVE

## 2018-01-28 NOTE — Telephone Encounter (Signed)
Contacted pt to inform of negative STD results. No answer, so a VM was left asking for a return call.

## 2018-01-29 NOTE — Telephone Encounter (Signed)
Pt returned call and I informed her of negative STD results in urine and throat cultures. Pt had no further questions.

## 2018-02-08 ENCOUNTER — Encounter: Payer: Self-pay | Admitting: Internal Medicine

## 2018-02-12 MED ORDER — METRONIDAZOLE 500 MG PO TABS: 500.0000 mg | ORAL_TABLET | Freq: Two times a day (BID) | ORAL | 0 refills | Status: DC

## 2018-03-13 ENCOUNTER — Encounter: Payer: Self-pay | Admitting: Internal Medicine

## 2018-03-14 MED ORDER — CETIRIZINE HCL 5 MG PO TABS: 5.0000 mg | ORAL_TABLET | Freq: Every day | ORAL | 3 refills | Status: DC

## 2018-03-20 ENCOUNTER — Telehealth: Payer: Self-pay | Admitting: Internal Medicine

## 2018-03-20 ENCOUNTER — Encounter: Payer: Self-pay | Admitting: Internal Medicine

## 2018-03-20 ENCOUNTER — Other Ambulatory Visit (HOSPITAL_COMMUNITY)
Admission: RE | Admit: 2018-03-20 | Discharge: 2018-03-20 | Disposition: A | Discharge status: Home / Self Care | Payer: BLUE CROSS/BLUE SHIELD | Source: Ambulatory Visit | Attending: Family Medicine | Admitting: Family Medicine

## 2018-03-20 ENCOUNTER — Ambulatory Visit (HOSPITAL_COMMUNITY)
Admission: RE | Admit: 2018-03-20 | Discharge: 2018-03-20 | Disposition: A | Discharge status: Home / Self Care | Payer: BLUE CROSS/BLUE SHIELD | Source: Ambulatory Visit | Attending: Family Medicine | Admitting: Family Medicine

## 2018-03-20 ENCOUNTER — Ambulatory Visit (INDEPENDENT_AMBULATORY_CARE_PROVIDER_SITE_OTHER): Payer: BLUE CROSS/BLUE SHIELD | Admitting: Internal Medicine

## 2018-03-20 VITALS — BP 102/64 | HR 72 | Temp 98.5°F | Wt 210.0 lb

## 2018-03-20 DIAGNOSIS — N898 Other specified noninflammatory disorders of vagina: Secondary | ICD-10-CM | POA: Diagnosis not present

## 2018-03-20 DIAGNOSIS — R1032 Left lower quadrant pain: Secondary | ICD-10-CM | POA: Insufficient documentation

## 2018-03-20 DIAGNOSIS — R1030 Lower abdominal pain, unspecified: Secondary | ICD-10-CM | POA: Diagnosis not present

## 2018-03-20 LAB — POCT WET PREP (WET MOUNT)
Clue Cells Wet Prep Whiff POC: NEGATIVE
Trichomonas Wet Prep HPF POC: ABSENT

## 2018-03-20 NOTE — Patient Instructions (Addendum)
I want to rule out something called ovarian torsion.  We made you an appointment to get an ultrasound.  [facility] that you can get this ultrasound.

## 2018-03-20 NOTE — Progress Notes (Signed)
   [facility] Phone: [phone]   Date of Visit: 03/20/2018   HPI:  Left Groin Pain:  - patient reports of initially intermittent left groin pain for the past 1-2 weeks. This is sharp pain that would last a very short period of time (seconds to minutes). She initially thought she can stretch it out  - on Sunday, the pain became constant, sharp, and severe, she tried to "stretch it out" but did not get better. Last night she took Ibuprofen 800mg  once which did not help.  the pain is worse with movement.. - she denies fevers, abdominal pain, chills, dysuria, urinary frequency. No diarrhea, no abnormal vaginal bleeding, no vaginal discharge.  - she is sexually active. Has Nexplanon. Has regular periods, with last period finished on 4/23.   ROS: See HPI.  [place]:  PMH: Allergic rhinitis Obesity Sickle cell trait Major depressive disorder  PHYSICAL EXAM: BP 102/64   Pulse 72   Temp 98.5 F (36.9 C) (Oral)   Wt 210 lb (95.3 kg)   BMI 36.05 kg/m  Gen: NAD, non-toxic CV: RRR, no murmurs, rubs, or gallops PULM: CTAB, normal effort ABD: Soft, nontender, nondistended, NABS, no organomegaly, tenderness to palpation of the left inguinal crease.  GU: Female genitalia: normal external genitalia, vulva, vagina, cervix, uterus and adnexa.  Physiologic white discharge.  No cervical motion tenderness or adnexal tenderness. SKIN: No rash or cyanosis; warm and well-perfused EXTR: No lower extremity edema or calf tenderness PSYCH: Mood and affect euthymic, normal rate and volume of speech NEURO: Awake, alert, no focal deficits grossly, normal speech   ASSESSMENT/PLAN:  Left groin pain: She does not have any abdominal pain or abdominal tenderness, therefore unlikely this is GI related.  Consider GYN causes.  Unlikely PID as there is no cervical motion tenderness or adnexal tenderness.  Wet prep was unremarkable.  We did obtain gonorrhea and Chlamydia testing.   Considered ovarian torsion as she had intermittent fleeting groin pain for days then suddenly had constant pain in the past 3 days.  We obtained a stat ultrasound with Doppler to rule out ovarian torsion at [facility] shortly after she left her clinic.  The ultrasound was unremarkable.  Her symptoms do not appear to be consistent with a UTI or kidney stones.  We also considered musculoskeletal etiology although she has not really had increase activity.  Spoke to her over the phone after she had the ultrasound done.  I would like her to get a urine pregnancy test at our clinic tomorrow to be thorough.  I do not think this is a ectopic pregnancy.  Without other symptoms such as fever and chills infectious etiology is less likely.  Will treat as a MSK etiology with extra strength Tylenol.  Will add likely naproxen after urine pregnancy is done tomorrow.  ED precautions. Discussed with preceptor.     Smiley Houseman, MD PGY [place]

## 2018-03-20 NOTE — Telephone Encounter (Signed)
Called patient to inform of [place] results. Likely MSK. Even though she did have period in April, I would like to get urine pregnancy. Asked her to make a lab visit tomorrow to get this. In the mean time can use Tylenol. After pregnancy test we can try NSAID.

## 2018-03-20 NOTE — Progress Notes (Signed)
**  After Hours/ Emergency Line Call*  Received a call to report that Christy Barnes had normal torsion [place] study.  [place] tech asking permission to let patient go home, which I approved.  Will forward to PCP.  Steve Rattler, DO PGY-2, [facility]

## 2018-03-21 ENCOUNTER — Other Ambulatory Visit (INDEPENDENT_AMBULATORY_CARE_PROVIDER_SITE_OTHER): Payer: BLUE CROSS/BLUE SHIELD

## 2018-03-21 ENCOUNTER — Telehealth: Payer: Self-pay | Admitting: Internal Medicine

## 2018-03-21 DIAGNOSIS — R1032 Left lower quadrant pain: Secondary | ICD-10-CM | POA: Diagnosis not present

## 2018-03-21 LAB — POCT URINE PREGNANCY: Preg Test, Ur: NEGATIVE

## 2018-03-21 LAB — CERVICOVAGINAL ANCILLARY ONLY
Chlamydia: NEGATIVE
Neisseria Gonorrhea: NEGATIVE

## 2018-03-21 NOTE — Telephone Encounter (Signed)
Attempted to call patient to discuss negative urine test and to see how she is doing. Went to [employer]. Left message to call back

## 2018-04-16 ENCOUNTER — Encounter: Payer: Self-pay | Admitting: Internal Medicine

## 2018-04-17 NOTE — Progress Notes (Signed)
   Subjective:    Patient ID: Christy Barnes , female   DOB: Jul 20, 1996 , 22 y.o..   MRN: [id number]  HPI  Christy Barnes is a 22 year old female here for   1. STD Check: She is coming in today for a STD check.  She notes that she found out that her sexual partner has been having multiple other sexual partners.  She is concerned because he said that he has never been tested for STDs.  She denies any unusual vaginal discharge, bleeding, vaginal odor, dyspareunia.  She notes that her last period was last week.  She has a Nexplanon in place.  Review of Systems: Per HPI.   Social Hx:  reports that she has never smoked. She has never used smokeless tobacco.   Objective:   BP 108/68   Pulse 75   Temp 99.2 F (37.3 C) (Oral)   Wt 211 lb 9.6 oz (96 kg)   BMI 36.32 kg/m  Physical Exam  Gen: NAD, alert, cooperative with exam, well-appearing GYN:  External genitalia within normal limits.  Vaginal mucosa pink, moist, normal rugae.  Nonfriable cervix with nabothian cyst at the 3 o'clock position, no discharge or bleeding noted on speculum exam.   Results for orders placed or performed in visit on 04/18/18  POCT Wet Prep [facility])  Result Value Ref Range   Source Wet Prep POC VAG    WBC, Wet Prep HPF POC 0-3    Bacteria Wet Prep HPF POC Few Few   Clue Cells Wet Prep HPF POC None None   Clue Cells Wet Prep Whiff POC Negative Whiff    Yeast Wet Prep HPF POC None None   KOH Wet Prep POC None None   Trichomonas Wet Prep HPF POC Absent Absent    Assessment & Plan:   1. Screening for STD (sexually transmitted disease): No known exposure but she is worried because her sexual partner admitted he was with several other people.  No alarming symptoms. Wet prep negative.  - POCT Wet Prep [facility]) - Cervicovaginal ancillary only - HIV antibody (with reflex) - RPR  Smitty Cords, MD [place], PGY-3

## 2018-04-18 ENCOUNTER — Encounter: Payer: Self-pay | Admitting: Family Medicine

## 2018-04-18 ENCOUNTER — Other Ambulatory Visit (HOSPITAL_COMMUNITY)
Admission: RE | Admit: 2018-04-18 | Discharge: 2018-04-18 | Disposition: A | Discharge status: Home / Self Care | Payer: BLUE CROSS/BLUE SHIELD | Source: Ambulatory Visit | Attending: Family Medicine | Admitting: Family Medicine

## 2018-04-18 ENCOUNTER — Other Ambulatory Visit: Payer: Self-pay

## 2018-04-18 ENCOUNTER — Ambulatory Visit (INDEPENDENT_AMBULATORY_CARE_PROVIDER_SITE_OTHER): Payer: BLUE CROSS/BLUE SHIELD | Admitting: Family Medicine

## 2018-04-18 VITALS — BP 108/68 | HR 75 | Temp 99.2°F | Wt 211.6 lb

## 2018-04-18 DIAGNOSIS — Z113 Encounter for screening for infections with a predominantly sexual mode of transmission: Secondary | ICD-10-CM | POA: Diagnosis not present

## 2018-04-18 LAB — POCT WET PREP (WET MOUNT)
Clue Cells Wet Prep Whiff POC: NEGATIVE
Trichomonas Wet Prep HPF POC: ABSENT

## 2018-04-18 NOTE — Patient Instructions (Addendum)
Thank you for coming in today, it was so nice to see you! Today we talked about:    STD screening: We checked you for all potential STDs today.  Very proud of you for coming in to be tested today.  The test that we did today that takes only 20 minutes to come back came back negative.  We are still awaiting some other tests to come back, they will take about 24 to 48 hours.  In the meantime to prevent STDs always use condoms for protection.   If we ordered any tests today, you will be notified via telephone of any abnormalities. If everything is normal you will get a letter in the mail.   If you have any questions or concerns, please do not hesitate to call the office at [phone]. You can also message me directly via MyChart.   Sincerely,  Smitty Cords, MD

## 2018-04-19 LAB — CERVICOVAGINAL ANCILLARY ONLY
Chlamydia: NEGATIVE
Neisseria Gonorrhea: NEGATIVE

## 2018-04-19 LAB — HIV ANTIBODY (ROUTINE TESTING W REFLEX): HIV Screen 4th Generation wRfx: NONREACTIVE

## 2018-04-19 LAB — RPR: RPR Ser Ql: NONREACTIVE

## 2018-04-22 ENCOUNTER — Encounter: Payer: Self-pay | Admitting: Family Medicine

## 2018-04-22 ENCOUNTER — Telehealth: Payer: Self-pay | Admitting: Internal Medicine

## 2018-04-22 NOTE — Telephone Encounter (Signed)
LM for patient.  Labs are negative. Jazmin Hartsell,CMA

## 2018-04-22 NOTE — Telephone Encounter (Signed)
Pt called wanting to see if her results were in from her last appointment on 04/18/18.

## 2018-04-23 ENCOUNTER — Encounter: Payer: Self-pay | Admitting: *Deleted

## 2018-04-23 NOTE — Telephone Encounter (Signed)
mychart message sent to patient. Jazmin Hartsell,CMA  

## 2018-05-14 ENCOUNTER — Encounter

## 2018-06-03 DIAGNOSIS — F330 Major depressive disorder, recurrent, mild: Secondary | ICD-10-CM | POA: Diagnosis not present

## 2018-06-14 ENCOUNTER — Ambulatory Visit (INDEPENDENT_AMBULATORY_CARE_PROVIDER_SITE_OTHER): Payer: BLUE CROSS/BLUE SHIELD | Admitting: Family Medicine

## 2018-06-14 VITALS — BP 105/80 | HR 80 | Temp 99.3°F | Wt 215.4 lb

## 2018-06-14 DIAGNOSIS — R42 Dizziness and giddiness: Secondary | ICD-10-CM

## 2018-06-14 DIAGNOSIS — H938X3 Other specified disorders of ear, bilateral: Secondary | ICD-10-CM | POA: Insufficient documentation

## 2018-06-14 NOTE — Assessment & Plan Note (Signed)
Ear exam normal without tenderness, edema, or fluid behind tympanic membranes.  No increased cerumen as well.  Likely feelings of being clogged is from congestion secondary to seasonal allergies.  Recommended over-the-counter congestions and alternating Zyrtec with Benadryl.  Patient works at [employer] and has knowledge of medications. -OTC decongestants -follow up as needed

## 2018-06-14 NOTE — Assessment & Plan Note (Addendum)
Patient with symptoms of feeling dizzy randomly throughout the day.  No inciting events.  Patient does not associate feeling dizzy with times without food (hypoglycemia), rising from sitting to standing position, or menstrual cycle.  Unclear etiology at this time.  Cardiac exam without murmurs or arrhythmias.  Will obtain BMP and CBC to rule out electrolyte abnormalities or anemia.  Orthostatics negative.  If lab work appears normal and patient continues to have dizzy spells can consider referral to cardiology for event monitor.  Did not obtain EKG today as patient is asymptomatic and so less likely to see any abnormalities.  Strict return precautions given -Follow-up in 1-2 weeks if no improvement or sooner if worsening -BMP and CBC.  Will treat based on lab results -If no improvement can consider referral for event monitor

## 2018-06-14 NOTE — Progress Notes (Signed)
Subjective:    Patient ID: Christy Barnes, female    DOB: [birth year], 22 y.o.   MRN: [id number]   CC: ear congestion & dizzy spells  HPI: Ear congestion Presenting today with 2 weeks of ears feeling clogged.  Says she notices sometimes she has to keep TV louder and her parents asked her why.  Patient denies any increase in seasonal allergies.  Patient has been taking Zyrtec daily.  Patient does use Q-tips in her ears.  Patient also notes that she gets water in her ears during her shower and this irritates the ear more.  Patient does report swimming in the end of June but no longer has been in any pools.  Patient denies any pain to the area but says there is sometimes irritation.  Denies any fevers or chills.  Denies any cold or congestion.  Dizzy spells  Patient reports feeling "faintish" on and off.  Patient does not have any inciting events and says that it can happen at work, at home, or any time.  Patient says when she feels this way she feels slightly weak as if she is about to pass out, but denies any loss of consciousness.  Patient denies any chest pain or palpitations during the events.  Patient says sometimes she is spinning whereas other times the room appears spinning.  Patient says sometimes she is standing when this happened and it helps when she lays down.  Patient also reports that she feels much better after taking iron pills.  Patient denies that these occur cyclically with menstrual cycle.  Patient does have regular menses and has been on Nexplanon since 04/2016.  Patient denies that she could be pregnant as she uses condoms with every sexual intercourse as well as being on Nexplanon.  Objective:  BP 105/80 (BP Location: Right Arm, Patient Position: Sitting, Cuff Size: Normal)   Pulse 80   Temp 99.3 F (37.4 C) (Oral)   Wt 215 lb 6.4 oz (97.7 kg)   SpO2 99%   BMI 36.97 kg/m  Vitals and nursing note reviewed Orthostatic VS for the past 24 hrs:  BP- Lying Pulse- Lying BP-  Sitting Pulse- Sitting BP- Standing at 0 minutes Pulse- Standing at 0 minutes  06/14/18 1026 100/80 71 101/75 75 109/80 83    General: well nourished, in no acute distress HEENT: normocephalic, TM's visualized bilaterally, no edema of auditory canals, no pain with tugging of auricle, no scleral icterus or conjunctival pallor, no nasal discharge, moist mucous membranes, good dentition without erythema or discharge noted in posterior oropharynx Neck: supple, non-tender, without lymphadenopathy Cardiac: RRR, clear S1 and S2, no murmurs, rubs, or gallops Respiratory: clear to auscultation bilaterally, no increased work of breathing Abdomen: soft, nontender, nondistended, no masses or organomegaly. Bowel sounds present Extremities: no edema or cyanosis. Warm, well perfused. 2+ radial and PT pulses bilaterally Skin: warm and dry, no rashes noted Neuro: alert and oriented, no focal deficits, CN2-12 grossly intact, sensation intact bilaterally, 5/5 muscle strength in all extremities, normal grip strength    Assessment & Plan:    Ear congestion, bilateral Ear exam normal without tenderness, edema, or fluid behind tympanic membranes.  No increased cerumen as well.  Likely feelings of being clogged is from congestion secondary to seasonal allergies.  Recommended over-the-counter congestions and alternating Zyrtec with Benadryl.  Patient works at [employer] and has knowledge of medications. -OTC decongestants -follow up as needed  Dizzy spells Patient with symptoms of feeling dizzy randomly throughout the  day.  No inciting events.  Patient does not associate feeling dizzy with times without food (hypoglycemia), rising from sitting to standing position, or menstrual cycle.  Unclear etiology at this time.  Cardiac exam without murmurs or arrhythmias.  Will obtain BMP and CBC to rule out electrolyte abnormalities or anemia.  Orthostatics negative.  If lab work appears normal and patient continues to have  dizzy spells can consider referral to cardiology for event monitor.  Did not obtain EKG today as patient is asymptomatic and so less likely to see any abnormalities.  Strict return precautions given -Follow-up in 1-2 weeks if no improvement or sooner if worsening -BMP and CBC.  Will treat based on lab results -If no improvement can consider referral for event monitor    Return in about 1 week (around 06/21/2018).   Caroline More, DO, PGY-2

## 2018-06-14 NOTE — Patient Instructions (Signed)
Dizziness Dizziness is a common problem. It makes you feel unsteady or light-headed. You may feel like you are about to pass out (faint). Dizziness can lead to getting hurt if you stumble or fall. Dizziness can be caused by many things, including:  Medicines.  Not having enough water in your body (dehydration).  Illness.  Follow these instructions at home: Eating and drinking  Drink enough fluid to keep your pee (urine) clear or pale yellow. This helps to keep you from getting dehydrated. Try to drink more clear fluids, such as water.  Do not drink alcohol.  Limit how much caffeine you drink or eat, if your doctor tells you to do that.  Limit how much salt (sodium) you drink or eat, if your doctor tells you to do that. Activity  Avoid making quick movements. ? When you stand up from sitting in a chair, steady yourself until you feel okay. ? In the morning, first sit up on the side of the bed. When you feel okay, stand slowly while you hold onto something. Do this until you know that your balance is fine.  If you need to stand in one place for a long time, move your legs often. Tighten and relax the muscles in your legs while you are standing.  Do not drive or use heavy machinery if you feel dizzy.  Avoid bending down if you feel dizzy. Place items in your home so you can reach them easily without leaning over. Lifestyle  Do not use any products that contain nicotine or tobacco, such as cigarettes and e-cigarettes. If you need help quitting, ask your doctor.  Try to lower your stress level. You can do this by using methods such as yoga or meditation. Talk with your doctor if you need help. General instructions  Watch your dizziness for any changes.  Take over-the-counter and prescription medicines only as told by your doctor. Talk with your doctor if you think that you are dizzy because of a medicine that you are taking.  Tell a friend or a family member that you are feeling  dizzy. If he or she notices any changes in your behavior, have this person call your doctor.  Keep all follow-up visits as told by your doctor. This is important. Contact a doctor if:  Your dizziness does not go away.  Your dizziness or light-headedness gets worse.  You feel sick to your stomach (nauseous).  You have trouble hearing.  You have new symptoms.  You are unsteady on your feet.  You feel like the room is spinning. Get help right away if:  You throw up (vomit) or have watery poop (diarrhea), and you cannot eat or drink anything.  You have trouble: ? Talking. ? Walking. ? Swallowing. ? Using your arms, hands, or legs.  You feel generally weak.  You are not thinking clearly, or you have trouble forming sentences. A friend or family member may notice this.  You have: ? Chest pain. ? Pain in your belly (abdomen). ? Shortness of breath. ? Sweating.  Your vision changes.  You are bleeding.  You have a very bad headache.  You have neck pain or a stiff neck.  You have a fever. These symptoms may be an emergency. Do not wait to see if the symptoms will go away. Get medical help right away. Call your local emergency services (911 in the [place] Do not drive yourself to the hospital. Summary  Dizziness makes you feel unsteady or light-headed. You may  feel like you are about to pass out (faint).  Drink enough fluid to keep your pee (urine) clear or pale yellow. Do not drink alcohol.  Avoid making quick movements if you feel dizzy.  Watch your dizziness for any changes. This information is not intended to replace advice given to you by your health care provider. Make sure you discuss any questions you have with your health care provider. Document Released: 10/26/2011 Document Revised: 11/23/2016 Document Reviewed: 11/23/2016 Elsevier Interactive Patient Education  2017 [employer].  It was a pleasure seeing you today.   Today we discussed your ear  congestion and dizziness  For your ear congestion: please use over the counter decongestants (you can get these at your local pharmacy). Continue to either take zyrtec daily or you can alternate days with benadryl at night time.   For your dizziness: I will get labs today to look at your electrolytes and blood counts. If these are normal we can consider a possible referral to cardiology if these keep happening. If they happen regularly write down what is happening around the time you feel this way and come in for a follow up.   Please follow up in 1-2 weeks if no improvement or sooner if symptoms persist or worsen.  Please call the clinic immediately if you have any concerns.   Our clinic's number is [phone]. Please call with questions or concerns.   Thank you,  Caroline More, DO

## 2018-06-15 LAB — CBC
Hematocrit: 41.6 % (ref 34.0–46.6)
Hemoglobin: 14.2 g/dL (ref 11.1–15.9)
MCH: 31.7 pg (ref 26.6–33.0)
MCHC: 34.1 g/dL (ref 31.5–35.7)
MCV: 93 fL (ref 79–97)
Platelets: 282 10*3/uL (ref 150–450)
RBC: 4.48 x10E6/uL (ref 3.77–5.28)
RDW: 13.5 % (ref 12.3–15.4)
WBC: 4.1 10*3/uL (ref 3.4–10.8)

## 2018-06-15 LAB — BASIC METABOLIC PANEL
BUN/Creatinine Ratio: 8 — ABNORMAL LOW (ref 9–23)
BUN: 8 mg/dL (ref 6–20)
CO2: 23 mmol/L (ref 20–29)
Calcium: 9.4 mg/dL (ref 8.7–10.2)
Chloride: 104 mmol/L (ref 96–106)
Creatinine, Ser: 0.98 mg/dL (ref 0.57–1.00)
GFR calc Af Amer: 94 mL/min/{1.73_m2} (ref >59)
GFR calc non Af Amer: 82 mL/min/{1.73_m2} (ref >59)
Glucose: 83 mg/dL (ref 65–99)
Potassium: 4.6 mmol/L (ref 3.5–5.2)
Sodium: 141 mmol/L (ref 134–144)

## 2018-06-18 ENCOUNTER — Telehealth: Payer: Self-pay

## 2018-06-18 NOTE — Telephone Encounter (Signed)
Called and informed patient that she is not anemic and needs to follow up if symptoms persist. Per Dr. Gavin Pound, Gaye Alken, CMA

## 2018-06-26 DIAGNOSIS — F330 Major depressive disorder, recurrent, mild: Secondary | ICD-10-CM | POA: Diagnosis not present

## 2018-07-11 ENCOUNTER — Encounter: Payer: Self-pay | Admitting: Family Medicine

## 2018-07-12 ENCOUNTER — Ambulatory Visit (HOSPITAL_COMMUNITY)
Admission: EM | Admit: 2018-07-12 | Discharge: 2018-07-12 | Disposition: A | Discharge status: Home / Self Care | Payer: BLUE CROSS/BLUE SHIELD | Attending: Internal Medicine | Admitting: Internal Medicine

## 2018-07-12 ENCOUNTER — Encounter (HOSPITAL_COMMUNITY): Payer: Self-pay

## 2018-07-12 DIAGNOSIS — N760 Acute vaginitis: Secondary | ICD-10-CM | POA: Diagnosis not present

## 2018-07-12 DIAGNOSIS — F329 Major depressive disorder, single episode, unspecified: Secondary | ICD-10-CM | POA: Diagnosis not present

## 2018-07-12 DIAGNOSIS — D573 Sickle-cell trait: Secondary | ICD-10-CM | POA: Diagnosis not present

## 2018-07-12 DIAGNOSIS — N898 Other specified noninflammatory disorders of vagina: Secondary | ICD-10-CM | POA: Diagnosis not present

## 2018-07-12 DIAGNOSIS — E669 Obesity, unspecified: Secondary | ICD-10-CM | POA: Diagnosis not present

## 2018-07-12 DIAGNOSIS — Z8349 Family history of other endocrine, nutritional and metabolic diseases: Secondary | ICD-10-CM | POA: Diagnosis not present

## 2018-07-12 DIAGNOSIS — Z202 Contact with and (suspected) exposure to infections with a predominantly sexual mode of transmission: Secondary | ICD-10-CM | POA: Insufficient documentation

## 2018-07-12 DIAGNOSIS — L739 Follicular disorder, unspecified: Secondary | ICD-10-CM | POA: Diagnosis not present

## 2018-07-12 MED ORDER — METRONIDAZOLE 500 MG PO TABS: 500.0000 mg | ORAL_TABLET | Freq: Two times a day (BID) | ORAL | 0 refills | Status: AC

## 2018-07-12 MED ORDER — FLUCONAZOLE 150 MG PO TABS: 150.0000 mg | ORAL_TABLET | Freq: Once | ORAL | 1 refills | Status: AC

## 2018-07-12 MED ORDER — DOXYCYCLINE HYCLATE 100 MG PO CAPS: 100.0000 mg | ORAL_CAPSULE | Freq: Two times a day (BID) | ORAL | 0 refills | Status: AC

## 2018-07-12 NOTE — ED Provider Notes (Signed)
[place]    CSN: [id number] Arrival date & time: 07/12/18  1726     History   Chief Complaint Chief Complaint  Patient presents with  . Vaginal Irritation  . Allergic Reaction    HPI Christy Barnes is a 22 y.o. female.   22 year old female presents with vaginal irritation, increased vaginal discharge with odor for about 1 week. No distinct itching. Denies any fever, dysuria, lower abdominal or back pain, unusual bleeding, nausea, vomiting or diarrhea. Has not tried anything for symptoms. Has history of BV and yeast vaginitis in the past. Currently sexually active with one partner and occasionally uses condoms.  Also having a reaction on her face after eating peanuts about 1 week ago. Noticed that in the past, "bumps" will come up around her lips after eating peanuts. Now she has noticed multiple bumps on her face and 2 near her lips. Sore. Did have some yellowish to white discharge in one and she "popped it". Denies any throat swelling or difficulty breathing. She does have an Epi-Pen for fish/shrimp allergy. Has not applied any topical medication. Other chronic health issues include environmental allergies and she take Zyrtec daily.   The history is provided by the patient.    Past Medical History:  Diagnosis Date  . Labial tear freshman year  . Overweight and obesity(278.0)     Patient Active Problem List   Diagnosis Date Noted  . Ear congestion, bilateral 06/14/2018  . Dizzy spells 06/14/2018  . MDD (major depressive disorder), severe ([place]) 11/09/2017  . Impulse control disorder 11/09/2017  . Chest pain 08/09/2017  . Sickle cell trait ([place]) 06/07/2017  . Allergic rhinitis 05/08/2014  . Obesity (BMI 30-39.9) 11/04/2013  . Acne 11/04/2013  . Presence of subdermal contraceptive device 05/04/2013  . Hair loss 04/16/2013    Past Surgical History:  Procedure Laterality Date  . labia repair  freshman year    OB History   None      Home  Medications    Prior to Admission medications   Medication Sig Start Date End Date Taking? Authorizing Provider  cetirizine (ZYRTEC) 5 MG tablet Take 1 tablet (5 mg total) by mouth at bedtime. 03/14/18   Smiley Houseman, MD  doxycycline (VIBRAMYCIN) 100 MG capsule Take 1 capsule (100 mg total) by mouth 2 (two) times daily for 10 days. 07/12/18 07/22/18  Katy Apo, NP  EPINEPHrine 0.3 mg/0.3 mL IJ SOAJ injection Inject 0.3 mLs (0.3 mg total) into the skin as needed (Anaphylaxis). 11/10/17   Withrow, Elyse Jarvis, FNP  fluconazole (DIFLUCAN) 150 MG tablet Take 1 tablet (150 mg total) by mouth once for 1 dose. Repeat 1 tablet at end of antibiotic use 07/12/18 07/12/18  Katy Apo, NP  metroNIDAZOLE (FLAGYL) 500 MG tablet Take 1 tablet (500 mg total) by mouth 2 (two) times daily for 7 days. 07/12/18 07/19/18  Katy Apo, NP    Family History Family History  Problem Relation Age of Onset  . Thyroid disease Mother     Social History Social History   Tobacco Use  . Smoking status: Never Smoker  . Smokeless tobacco: Never Used  Substance Use Topics  . Alcohol use: Yes    Alcohol/week: 1.0 standard drinks    Types: 1 Glasses of wine per week    Comment: "socially"  . Drug use: No     Allergies   Fish allergy; Peanut-containing drug products; and Shrimp [shellfish allergy]   Review of  Systems Review of Systems  Constitutional: Negative for activity change, appetite change, chills, fatigue and fever.  HENT: Negative for congestion, facial swelling, mouth sores, postnasal drip, rhinorrhea, sore throat and trouble swallowing.   Respiratory: Negative for cough, chest tightness, shortness of breath and wheezing.   Cardiovascular: Negative for chest pain and palpitations.  Gastrointestinal: Negative for abdominal pain, diarrhea, nausea and vomiting.  Genitourinary: Positive for vaginal discharge and vaginal pain (irritation/discomfort). Negative for decreased urine volume,  difficulty urinating, dysuria, flank pain, frequency, genital sores, hematuria, pelvic pain, urgency and vaginal bleeding.  Musculoskeletal: Negative for arthralgias, back pain and myalgias.  Skin: Positive for rash. Negative for color change and wound.  Allergic/Immunologic: Positive for environmental allergies. Negative for immunocompromised state.  Neurological: Negative for dizziness, seizures, syncope, weakness, light-headedness and headaches.  Hematological: Negative for adenopathy. Does not bruise/bleed easily.     Physical Exam Triage Vital Signs ED Triage Vitals [07/12/18 1749]  Enc Vitals Group     BP 116/72     Pulse Rate 73     Resp 20     Temp 98.2 F (36.8 C)     Temp Source Oral     SpO2 100 %     Weight      Height      Head Circumference      Peak Flow      Pain Score      Pain Loc      Pain Edu?      Excl. in [place]?    No data found.  Updated Vital Signs BP 116/72 (BP Location: Left Arm)   Pulse 73   Temp 98.2 F (36.8 C) (Oral)   Resp 20   LMP  (LMP Unknown)   SpO2 100%   Visual Acuity Right Eye Distance:   Left Eye Distance:   Bilateral Distance:    Right Eye Near:   Left Eye Near:    Bilateral Near:     Physical Exam  Constitutional: She is oriented to person, place, and time. Vital signs are normal. She appears well-developed and well-nourished. She is cooperative. She does not appear ill. No distress.  Patient sitting on exam table in no acute distress. No difficulty breathing.   HENT:  Head: Normocephalic and atraumatic. Head is without laceration.    Right Ear: External ear normal.  Left Ear: External ear normal.  Nose: Nose normal.  Mouth/Throat: Uvula is midline, oropharynx is clear and moist and mucous membranes are normal.  Multiple red raised, pustular lesions present on forehead and 3 near lip. Slightly tender. No surrounding erythema. No discharge.   Eyes: Conjunctivae and EOM are normal.  Neck: Normal range of motion. Neck  supple.  Cardiovascular: Normal rate, regular rhythm and normal heart sounds.  No murmur heard. Pulmonary/Chest: Effort normal and breath sounds normal. No respiratory distress. She has no decreased breath sounds. She has no wheezes. She has no rhonchi.  Abdominal: Soft. Normal appearance and bowel sounds are normal. She exhibits no distension and no mass. There is no tenderness. There is no rigidity, no rebound, no guarding and no CVA tenderness.  Genitourinary: Uterus normal. Pelvic exam was performed with patient in the knee-chest position. There is no rash, tenderness, lesion or injury on the right labia. There is no rash, tenderness, lesion or injury on the left labia. Cervix exhibits discharge. Cervix exhibits no motion tenderness and no friability. Right adnexum displays no mass and no tenderness. Left adnexum displays no mass and no tenderness.  No erythema, tenderness or bleeding in the vagina. Vaginal discharge (yellow, thin with odor) found.    Genitourinary Comments: Yellowish-brown discoloration of cervix around 7 o'clock. Slight yellowish discharge from cervical os.   Musculoskeletal: Normal range of motion.  Lymphadenopathy:    She has no cervical adenopathy.  Neurological: She is alert and oriented to person, place, and time.  Skin: Skin is warm, dry and intact. Capillary refill takes less than 2 seconds. Rash noted. Rash is papular and pustular.  Psychiatric: She has a normal mood and affect. Her behavior is normal. Judgment and thought content normal.  Vitals reviewed.    UC Treatments / Results  Labs (all labs ordered are listed, but only abnormal results are displayed) Labs Reviewed  CERVICOVAGINAL ANCILLARY ONLY    EKG None  Radiology No results found.  Procedures Procedures (including critical care time)  Medications Ordered in UC Medications - No data to display  Initial Impression / Assessment and Plan / UC Course  I have reviewed the triage vital signs  and the nursing notes.  Pertinent labs & imaging results that were available during my care of the patient were reviewed by me and considered in my medical decision making (see chart for details).    Discussed that she probably has bacterial vaginitis but may have yeast as well. Will start Flagyl 500mg  twice a day as directed. Take Diflucan 150mg  - 1 tablet tomorrow, repeat 1 tablet at end of Flagyl use. No sexual intercourse for the next 7 days. Encouraged to use condoms with each and every future sexual encounter. Uncertain etiology of discoloration of part of cervix. Last pap smear 05/2017 was normal. Recommend continue to monitor with next pap smear in 1 year.  Reviewed that she appears to have a mild folliculitis on her face. No significant allergic reaction. Continue to monitor. If Flagyl does not start improving facial lesions within 5 days, may start Doxycycline 100mg  twice a day as directed. Follow-up pending lab results.  Final Clinical Impressions(s) / UC Diagnoses   Final diagnoses:  Acute vaginitis  Potential exposure to STD  Acute folliculitis     Discharge Instructions     Recommend start Flagyl 500mg  twice a day as directed. Start Diflucan 150mg  one tablet tomorrow, repeat 1 tablet again at end of Flagyl use. If bumps on your face do not improve within 5 days, may start Doxycycline 100mg  twice a day as directed (written Rx given). No sexual intercourse for the next 7 days. Encouraged to use condoms with each and every future sexual encounter. Follow-up pending lab results.     ED Prescriptions    Medication Sig Dispense Auth. Provider   metroNIDAZOLE (FLAGYL) 500 MG tablet Take 1 tablet (500 mg total) by mouth 2 (two) times daily for 7 days. 14 tablet Katy Apo, NP   fluconazole (DIFLUCAN) 150 MG tablet Take 1 tablet (150 mg total) by mouth once for 1 dose. Repeat 1 tablet at end of antibiotic use 1 tablet Amyot, Nicholes Stairs, NP   doxycycline (VIBRAMYCIN) 100 MG capsule  Take 1 capsule (100 mg total) by mouth 2 (two) times daily for 10 days. 20 capsule Katy Apo, NP     Controlled Substance Prescriptions  Controlled Substance Registry consulted? Not Applicable   Katy Apo, NP 07/12/18 2324

## 2018-07-12 NOTE — ED Triage Notes (Signed)
Pt presents with vaginal discomfort; thick, smelly discharge  Pt also complains of allergic reaction on face from nuts

## 2018-07-12 NOTE — Discharge Instructions (Addendum)
Recommend start Flagyl 500mg  twice a day as directed. Start Diflucan 150mg  one tablet tomorrow, repeat 1 tablet again at end of Flagyl use. If bumps on your face do not improve within 5 days, may start Doxycycline 100mg  twice a day as directed (written Rx given). No sexual intercourse for the next 7 days. Encouraged to use condoms with each and every future sexual encounter. Follow-up pending lab results.

## 2018-07-15 LAB — CERVICOVAGINAL ANCILLARY ONLY
Bacterial vaginitis: POSITIVE — AB
Candida vaginitis: NEGATIVE
Chlamydia: NEGATIVE
Neisseria Gonorrhea: NEGATIVE
Trichomonas: NEGATIVE

## 2018-07-31 ENCOUNTER — Ambulatory Visit (INDEPENDENT_AMBULATORY_CARE_PROVIDER_SITE_OTHER): Payer: BLUE CROSS/BLUE SHIELD | Admitting: Family Medicine

## 2018-07-31 ENCOUNTER — Other Ambulatory Visit: Payer: Self-pay

## 2018-07-31 DIAGNOSIS — J069 Acute upper respiratory infection, unspecified: Secondary | ICD-10-CM | POA: Insufficient documentation

## 2018-07-31 MED ORDER — LORATADINE-PSEUDOEPHEDRINE ER 10-240 MG PO TB24: 1.0000 | ORAL_TABLET | Freq: Every day | ORAL | 1 refills | Status: DC

## 2018-07-31 NOTE — Patient Instructions (Addendum)
It was great to meet you today! Thank you for letting me participate in your care!  Today, we discussed your continued congestion and sensation of muffled hearing. This is most likely caused by a viral infection as I did not see any signs of a bacterial infection. Please use the medication called Claritin-D as instructed to help relieve your congestion. You can also take some OTC zinc tablets to help shorten the duration of your symptoms.    Be well, Harolyn Rutherford, DO PGY-2, [facility]   Viral Respiratory Infection A viral respiratory infection is an illness that affects parts of the body used for breathing, like the lungs, nose, and throat. It is caused by a germ called a virus. Some examples of this kind of infection are:  A cold.  The flu (influenza).  A respiratory syncytial virus (RSV) infection.  How do I know if I have this infection? Most of the time this infection causes:  A stuffy or runny nose.  Yellow or green fluid in the nose.  A cough.  Sneezing.  Tiredness (fatigue).  Achy muscles.  A sore throat.  Sweating or chills.  A fever.  A headache.  How is this infection treated? If the flu is diagnosed early, it may be treated with an antiviral medicine. This medicine shortens the length of time a person has symptoms. Symptoms may be treated with over-the-counter and prescription medicines, such as:  Expectorants. These make it easier to cough up mucus.  Decongestant nasal sprays.  Doctors do not prescribe antibiotic medicines for viral infections. They do not work with this kind of infection. How do I know if I should stay home? To keep others from getting sick, stay home if you have:  A fever.  A lasting cough.  A sore throat.  A runny nose.  Sneezing.  Muscles aches.  Headaches.  Tiredness.  Weakness.  Chills.  Sweating.  An upset stomach (nausea).  Follow these instructions at home:  Rest as much as  possible.  Take over-the-counter and prescription medicines only as told by your doctor.  Drink enough fluid to keep your pee (urine) clear or pale yellow.  Gargle with salt water. Do this 3-4 times per day or as needed. To make a salt-water mixture, dissolve -1 tsp of salt in 1 cup of warm water. Make sure the salt dissolves all the way.  Use nose drops made from salt water. This helps with stuffiness (congestion). It also helps soften the skin around your nose.  Do not drink alcohol.  Do not use tobacco products, including cigarettes, chewing tobacco, and e-cigarettes. If you need help quitting, ask your doctor. Get help if:  Your symptoms last for 10 days or longer.  Your symptoms get worse over time.  You have a fever.  You have very bad pain in your face or forehead.  Parts of your jaw or neck become very swollen. Get help right away if:  You feel pain or pressure in your chest.  You have shortness of breath.  You faint or feel like you will faint.  You keep throwing up (vomiting).  You feel confused. This information is not intended to replace advice given to you by your health care provider. Make sure you discuss any questions you have with your health care provider. Document Released: 10/19/2008 Document Revised: 04/13/2016 Document Reviewed: 04/14/2015 Elsevier Interactive Patient Education  2018 [employer].

## 2018-07-31 NOTE — Assessment & Plan Note (Signed)
Patient has congestion most likely from viral URI that has caused fluid build up behind the inner ear most likely causing her symptoms. Claritin-D to attempt to relieve congestion and help dry up sinuses. Discussed with patient if no improvement in 5-7 days she should return and if she develops fever, ear pain, or worsening hearing loss to return to clinic immediately.

## 2018-07-31 NOTE — Progress Notes (Signed)
     Subjective: Chief Complaint  Patient presents with  . Otitis Media    HPI: Christy Barnes is a 22 y.o. presenting to clinic today to discuss the following:  Bilateral "Muffled" Hearing Patient states she thinks she got a cold around Tuesday or Wednesday of last week that presented with the typical constellation of symptoms such as cough, sore throat, congestion, and runny nose. She states she had no fever, chills,difficulty breathing, nausea, or vomiting. She states over the past few days she has noticed it is harder to hear and sounds feel like they are "muffled" like "I'm hearing things under water". She has often had to ask people to speak louder, turn up her TV etc. She denies any ear pain. She tried taking something OTC for congestion but it did not help.     ROS noted in HPI.   Past Medical, Surgical, Social, and Family History Reviewed & Updated per EMR.   Pertinent Historical Findings include:   Social History   Tobacco Use  Smoking Status Never Smoker  Smokeless Tobacco Never Used   Objective: BP 98/68   Pulse 98   Temp 99.3 F (37.4 C) (Oral)   Ht 5\' 4"  (1.626 m)   Wt 215 lb 6.4 oz (97.7 kg)   LMP  (LMP Unknown)   SpO2 98%   BMI 36.97 kg/m  Vitals and nursing notes reviewed  Physical Exam Gen: Alert and Oriented x 3, NAD HEENT: Normocephalic, atraumatic, PERRLA, EOMI, TM visible with good light reflex, no TM bulging, some fluid behind the TM bilaterally, non-swollen, non-erythematous turbinates, non-erythematous pharyngeal mucosa, no exudates MSK: Moves all four extremities Ext: no clubbing, cyanosis, or edema Skin: warm, dry, intact, no rashes  No results found for this or any previous visit (from the past 72 hour(s)).  Assessment/Plan:  Viral URI Patient has congestion most likely from viral URI that has caused fluid build up behind the inner ear most likely causing her symptoms. Claritin-D to attempt to relieve congestion and help dry up  sinuses. Discussed with patient if no improvement in 5-7 days she should return and if she develops fever, ear pain, or worsening hearing loss to return to clinic immediately.   PATIENT EDUCATION PROVIDED: See AVS    Diagnosis and plan along with any newly prescribed medication(s) were discussed in detail with this patient today. The patient verbalized understanding and agreed with the plan. Patient advised if symptoms worsen return to clinic or ER.   Health Maintainance:   No orders of the defined types were placed in this encounter.   Meds ordered this encounter  Medications  . loratadine-pseudoephedrine (CLARITIN-D 24 HOUR) 10-240 MG 24 hr tablet    Sig: Take 1 tablet by mouth daily.    Dispense:  30 tablet    Refill:  [place], DO 07/31/2018, 3:58 PM PGY-2 [place]

## 2018-08-10 ENCOUNTER — Emergency Department (HOSPITAL_COMMUNITY): Payer: BLUE CROSS/BLUE SHIELD

## 2018-08-10 ENCOUNTER — Encounter (HOSPITAL_COMMUNITY): Payer: Self-pay | Admitting: *Deleted

## 2018-08-10 ENCOUNTER — Emergency Department (HOSPITAL_COMMUNITY)
Admission: EM | Admit: 2018-08-10 | Discharge: 2018-08-11 | Disposition: A | Discharge status: Home / Self Care | Payer: BLUE CROSS/BLUE SHIELD | Attending: Emergency Medicine | Admitting: Emergency Medicine

## 2018-08-10 DIAGNOSIS — R51 Headache: Secondary | ICD-10-CM | POA: Diagnosis not present

## 2018-08-10 DIAGNOSIS — S0990XA Unspecified injury of head, initial encounter: Secondary | ICD-10-CM | POA: Diagnosis not present

## 2018-08-10 DIAGNOSIS — S299XXA Unspecified injury of thorax, initial encounter: Secondary | ICD-10-CM | POA: Diagnosis not present

## 2018-08-10 DIAGNOSIS — R079 Chest pain, unspecified: Secondary | ICD-10-CM | POA: Diagnosis not present

## 2018-08-10 DIAGNOSIS — Y939 Activity, unspecified: Secondary | ICD-10-CM | POA: Insufficient documentation

## 2018-08-10 DIAGNOSIS — R0602 Shortness of breath: Secondary | ICD-10-CM | POA: Insufficient documentation

## 2018-08-10 DIAGNOSIS — S0083XA Contusion of other part of head, initial encounter: Secondary | ICD-10-CM | POA: Insufficient documentation

## 2018-08-10 DIAGNOSIS — M545 Low back pain: Secondary | ICD-10-CM | POA: Diagnosis not present

## 2018-08-10 DIAGNOSIS — S12031A Nondisplaced posterior arch fracture of first cervical vertebra, initial encounter for closed fracture: Secondary | ICD-10-CM | POA: Diagnosis not present

## 2018-08-10 DIAGNOSIS — S12001A Unspecified nondisplaced fracture of first cervical vertebra, initial encounter for closed fracture: Secondary | ICD-10-CM | POA: Diagnosis not present

## 2018-08-10 DIAGNOSIS — Y92410 Unspecified street and highway as the place of occurrence of the external cause: Secondary | ICD-10-CM | POA: Diagnosis not present

## 2018-08-10 DIAGNOSIS — Y999 Unspecified external cause status: Secondary | ICD-10-CM | POA: Insufficient documentation

## 2018-08-10 DIAGNOSIS — R0789 Other chest pain: Secondary | ICD-10-CM | POA: Diagnosis not present

## 2018-08-10 DIAGNOSIS — S199XXA Unspecified injury of neck, initial encounter: Secondary | ICD-10-CM | POA: Diagnosis not present

## 2018-08-10 DIAGNOSIS — S301XXA Contusion of abdominal wall, initial encounter: Secondary | ICD-10-CM | POA: Diagnosis not present

## 2018-08-10 DIAGNOSIS — S3992XA Unspecified injury of lower back, initial encounter: Secondary | ICD-10-CM | POA: Diagnosis not present

## 2018-08-10 DIAGNOSIS — S3993XA Unspecified injury of pelvis, initial encounter: Secondary | ICD-10-CM | POA: Diagnosis not present

## 2018-08-10 DIAGNOSIS — S3991XA Unspecified injury of abdomen, initial encounter: Secondary | ICD-10-CM | POA: Diagnosis not present

## 2018-08-10 LAB — URINALYSIS, ROUTINE W REFLEX MICROSCOPIC
Bilirubin Urine: NEGATIVE
Glucose, UA: NEGATIVE mg/dL
Hgb urine dipstick: NEGATIVE
Ketones, ur: NEGATIVE mg/dL
Leukocytes, UA: NEGATIVE
Nitrite: NEGATIVE
Protein, ur: NEGATIVE mg/dL
Specific Gravity, Urine: 1.015 (ref 1.005–1.030)
pH: 6 (ref 5.0–8.0)

## 2018-08-10 LAB — PREGNANCY, URINE: Preg Test, Ur: NEGATIVE

## 2018-08-10 LAB — COMPREHENSIVE METABOLIC PANEL
ALT: 15 U/L (ref 0–44)
AST: 26 U/L (ref 15–41)
Albumin: 4.4 g/dL (ref 3.5–5.0)
Alkaline Phosphatase: 53 U/L (ref 38–126)
Anion gap: 10 (ref 5–15)
BUN: 11 mg/dL (ref 6–20)
CO2: 24 mmol/L (ref 22–32)
Calcium: 9.5 mg/dL (ref 8.9–10.3)
Chloride: 106 mmol/L (ref 98–111)
Creatinine, Ser: 0.89 mg/dL (ref 0.44–1.00)
GFR calc Af Amer: >60 mL/min (ref >60)
GFR calc non Af Amer: >60 mL/min (ref >60)
Glucose, Bld: 95 mg/dL (ref 70–99)
Potassium: 3.9 mmol/L (ref 3.5–5.1)
Sodium: 140 mmol/L (ref 135–145)
Total Bilirubin: 1.1 mg/dL (ref 0.3–1.2)
Total Protein: 8 g/dL (ref 6.5–8.1)

## 2018-08-10 LAB — CBC WITH DIFFERENTIAL/PLATELET
Basophils Absolute: 0 10*3/uL (ref 0.0–0.1)
Basophils Relative: 0 %
Eosinophils Absolute: 0 10*3/uL (ref 0.0–0.7)
Eosinophils Relative: 0 %
HCT: 43.1 % (ref 36.0–46.0)
Hemoglobin: 14.8 g/dL (ref 12.0–15.0)
Lymphocytes Relative: 28 %
Lymphs Abs: 2.2 10*3/uL (ref 0.7–4.0)
MCH: 31 pg (ref 26.0–34.0)
MCHC: 34.3 g/dL (ref 30.0–36.0)
MCV: 90.4 fL (ref 78.0–100.0)
Monocytes Absolute: 0.4 10*3/uL (ref 0.1–1.0)
Monocytes Relative: 5 %
Neutro Abs: 5.2 10*3/uL (ref 1.7–7.7)
Neutrophils Relative %: 67 %
Platelets: 322 10*3/uL (ref 150–400)
RBC: 4.77 MIL/uL (ref 3.87–5.11)
RDW: 12.5 % (ref 11.5–15.5)
WBC: 7.8 10*3/uL (ref 4.0–10.5)

## 2018-08-10 LAB — I-STAT BETA HCG BLOOD, ED (MC, WL, AP ONLY): I-stat hCG, quantitative: <5 m[IU]/mL (ref <5)

## 2018-08-10 MED ORDER — IOPAMIDOL (ISOVUE-300) INJECTION 61%
100.0000 mL | Freq: Once | INTRAVENOUS | Status: AC | PRN
  Administered 2018-08-10: 100 mL via INTRAVENOUS

## 2018-08-10 MED ORDER — SODIUM CHLORIDE 0.9 % IV BOLUS
1000.0000 mL | Freq: Once | INTRAVENOUS | Status: AC
  Administered 2018-08-10: 1000 mL via INTRAVENOUS

## 2018-08-10 MED ORDER — OXYCODONE-ACETAMINOPHEN 5-325 MG PO TABS
2.0000 | ORAL_TABLET | Freq: Once | ORAL | Status: AC
  Administered 2018-08-10: 1 via ORAL
  Filled 2018-08-10: qty 2

## 2018-08-10 MED ORDER — HYDROCODONE-ACETAMINOPHEN 5-325 MG PO TABS: 1.0000 | ORAL_TABLET | Freq: Four times a day (QID) | ORAL | 0 refills | Status: DC | PRN

## 2018-08-10 MED ORDER — MORPHINE SULFATE (PF) 4 MG/ML IV SOLN
4.0000 mg | Freq: Once | INTRAVENOUS | Status: AC
  Administered 2018-08-10: 4 mg via INTRAVENOUS
  Filled 2018-08-10: qty 1
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
## 2018-08-10 NOTE — Discharge Instructions (Signed)
Today you were diagnosed with a fracture of your C1 vertebrae.  You will need to wear the cervical collar at all times.  You will need to call the neurosurgery office to make an appointment to follow-up in the next 1 to 2 weeks.

## 2018-08-10 NOTE — ED Provider Notes (Signed)
Patient signed out to me at shift change.  Pending CT imaging.  C1 arch fracture seen on CT.  This was discussed with neurosurg by prior provider.  Aspen collar and f/u.  Remaining CTs are negative.   Montine Circle, PA-C 08/10/18 2344    Drenda Freeze, MD 08/10/18 [phone]
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
## 2018-08-23 ENCOUNTER — Ambulatory Visit (INDEPENDENT_AMBULATORY_CARE_PROVIDER_SITE_OTHER): Payer: BLUE CROSS/BLUE SHIELD | Admitting: Family Medicine

## 2018-08-23 ENCOUNTER — Encounter: Payer: Self-pay | Admitting: Family Medicine

## 2018-08-23 ENCOUNTER — Other Ambulatory Visit: Payer: Self-pay

## 2018-08-23 DIAGNOSIS — G44209 Tension-type headache, unspecified, not intractable: Secondary | ICD-10-CM | POA: Diagnosis not present
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
## 2018-08-26 DIAGNOSIS — M542 Cervicalgia: Secondary | ICD-10-CM | POA: Diagnosis not present

## 2018-08-26 DIAGNOSIS — G44209 Tension-type headache, unspecified, not intractable: Secondary | ICD-10-CM | POA: Insufficient documentation

## 2018-08-26 DIAGNOSIS — Z6838 Body mass index (BMI) 38.0-38.9, adult: Secondary | ICD-10-CM | POA: Diagnosis not present

## 2018-08-26 DIAGNOSIS — S12001D Unspecified nondisplaced fracture of first cervical vertebra, subsequent encounter for fracture with routine healing: Secondary | ICD-10-CM | POA: Diagnosis not present
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
## 2018-08-26 NOTE — Assessment & Plan Note (Signed)
Will place referral for physical therapy today.  Encouraged patient to use ibuprofen, Tylenol, and hot pads as necessary for her pain.  Encouraged patient to continue abstaining from hydrocodone if she do not did not find it helpful, since this medication has a myriad of side effects.

## 2018-08-29 ENCOUNTER — Other Ambulatory Visit: Payer: Self-pay

## 2018-08-29 ENCOUNTER — Encounter: Payer: Self-pay | Admitting: Family Medicine

## 2018-08-29 ENCOUNTER — Ambulatory Visit (INDEPENDENT_AMBULATORY_CARE_PROVIDER_SITE_OTHER): Payer: BLUE CROSS/BLUE SHIELD | Admitting: Family Medicine

## 2018-08-29 VITALS — BP 115/70 | HR 82 | Temp 98.9°F | Wt 218.8 lb

## 2018-08-29 DIAGNOSIS — Z309 Encounter for contraceptive management, unspecified: Secondary | ICD-10-CM

## 2018-08-29 DIAGNOSIS — Z3046 Encounter for surveillance of implantable subdermal contraceptive: Secondary | ICD-10-CM

## 2018-08-29 NOTE — Progress Notes (Signed)
Patient ID: Christy Barnes, female   DOB: 31-Oct-1996, 22 y.o.   MRN: [id number] Progestin Implant Removal Note Christy Barnes) is here for removal of her etonogestrel rod implant (Implanon/Nexplanon). She would like it removed because of: She wants to stop using hormone birth control for now.  An informed consent was taken prior to removal and is to be scanned into the Electronic Health Record.  Risks of the procedure include: bleeding, infection, difficulty with removal, scarring and nerve damage. There may be bruising at the site of incision and down the arm.  Procedure Note: Time out taken: 11:25 AM  Team: Guinevere Ferrari  (PATIENT NAME) (PATIENT DOB) confirmed YES  Procedure: Progestin Implant Removal  Procedure confirmed by patient and team YES  Side: LEFT  Position correct for procedure YES  Equipment for procedure available (YES/NO)  The patient is place in the supine position. Aseptic conditions are maintained. The rod is located by palpation. The area is cleaned with antiseptic. 3 cc of 1% lidocaine with epinephrine is injected just underneath the end of the implant closest to the elbow. An additional 5 cc was injected since she continues to feel pain.  After firmly pressing down on the end of the implant closer to the axilla a 65mm incision is made with a scalpel. The rod is pushed to the incision site and grasped with a mosquito forceps and gently removed. Blunt dissection WAS NOT needed. The patient did tolerate the procedure well. The rod was removed in its entirety. The incision was dressed with a small adhesive bandage closure and a pressure dressing was applied. An alternate plan for contraception was discussed. The patient would like to use natural method or abstain from sex for now.    Andrena Mews, MD, MPH

## 2018-08-29 NOTE — Patient Instructions (Signed)

## 2018-09-16 DIAGNOSIS — M542 Cervicalgia: Secondary | ICD-10-CM | POA: Diagnosis not present

## 2018-09-16 DIAGNOSIS — S12001D Unspecified nondisplaced fracture of first cervical vertebra, subsequent encounter for fracture with routine healing: Secondary | ICD-10-CM | POA: Diagnosis not present

## 2018-10-05 ENCOUNTER — Encounter: Payer: Self-pay | Admitting: Family Medicine

## 2018-10-14 ENCOUNTER — Other Ambulatory Visit: Payer: Self-pay

## 2018-10-14 ENCOUNTER — Encounter: Payer: Self-pay | Admitting: Family Medicine

## 2018-10-14 ENCOUNTER — Ambulatory Visit (INDEPENDENT_AMBULATORY_CARE_PROVIDER_SITE_OTHER): Payer: BLUE CROSS/BLUE SHIELD | Admitting: Family Medicine

## 2018-10-14 VITALS — BP 94/68 | HR 68 | Temp 98.1°F | Ht 64.0 in | Wt 228.4 lb

## 2018-10-14 DIAGNOSIS — B373 Candidiasis of vulva and vagina: Secondary | ICD-10-CM | POA: Diagnosis not present

## 2018-10-14 DIAGNOSIS — R42 Dizziness and giddiness: Secondary | ICD-10-CM | POA: Diagnosis not present

## 2018-10-14 DIAGNOSIS — Z6838 Body mass index (BMI) 38.0-38.9, adult: Secondary | ICD-10-CM | POA: Diagnosis not present

## 2018-10-14 DIAGNOSIS — Z3200 Encounter for pregnancy test, result unknown: Secondary | ICD-10-CM

## 2018-10-14 DIAGNOSIS — B3731 Acute candidiasis of vulva and vagina: Secondary | ICD-10-CM

## 2018-10-14 DIAGNOSIS — N898 Other specified noninflammatory disorders of vagina: Secondary | ICD-10-CM | POA: Diagnosis not present

## 2018-10-14 DIAGNOSIS — M542 Cervicalgia: Secondary | ICD-10-CM | POA: Diagnosis not present

## 2018-10-14 DIAGNOSIS — S12001D Unspecified nondisplaced fracture of first cervical vertebra, subsequent encounter for fracture with routine healing: Secondary | ICD-10-CM | POA: Diagnosis not present

## 2018-10-14 LAB — POCT WET PREP (WET MOUNT)
Clue Cells Wet Prep Whiff POC: NEGATIVE
Trichomonas Wet Prep HPF POC: ABSENT

## 2018-10-14 MED ORDER — FLUCONAZOLE 150 MG PO TABS: 150.0000 mg | ORAL_TABLET | Freq: Once | ORAL | 0 refills | Status: AC

## 2018-10-14 MED ORDER — MECLIZINE HCL 25 MG PO TABS: 25.0000 mg | ORAL_TABLET | Freq: Three times a day (TID) | ORAL | 0 refills | Status: DC | PRN
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
## 2018-10-14 NOTE — Patient Instructions (Addendum)
It was a pleasure to see you today! Thank you for choosing [facility] for your primary care. Christy Barnes was seen for dizziness, vaginal odor.   Our plans for today were:  Please try the medicine that I sent for dizziness.   Your vaginal discharge is yeast  Please try physical therapy for your dizziness, you may have to call your neurologist to ask if this is ok.   Best,  Dr. Lindell Noe

## 2018-10-15 LAB — CBC
Hematocrit: 39.3 % (ref 34.0–46.6)
Hemoglobin: 13.4 g/dL (ref 11.1–15.9)
MCH: 31.8 pg (ref 26.6–33.0)
MCHC: 34.1 g/dL (ref 31.5–35.7)
MCV: 93 fL (ref 79–97)
Platelets: 270 10*3/uL (ref 150–450)
RBC: 4.22 x10E6/uL (ref 3.77–5.28)
RDW: 12.2 % — ABNORMAL LOW (ref 12.3–15.4)
WBC: 5.3 10*3/uL (ref 3.4–10.8)

## 2018-10-15 LAB — BASIC METABOLIC PANEL
BUN/Creatinine Ratio: 10 (ref 9–23)
BUN: 11 mg/dL (ref 6–20)
CO2: 22 mmol/L (ref 20–29)
Calcium: 9.3 mg/dL (ref 8.7–10.2)
Chloride: 102 mmol/L (ref 96–106)
Creatinine, Ser: 1.07 mg/dL — ABNORMAL HIGH (ref 0.57–1.00)
GFR calc Af Amer: 85 mL/min/{1.73_m2} (ref >59)
GFR calc non Af Amer: 73 mL/min/{1.73_m2} (ref >59)
Glucose: 90 mg/dL (ref 65–99)
Potassium: 4.1 mmol/L (ref 3.5–5.2)
Sodium: 140 mmol/L (ref 134–144)

## 2018-10-15 LAB — HCG, SERUM, QUALITATIVE: hCG,Beta Subunit,Qual,Serum: NEGATIVE m[IU]/mL (ref <6)

## 2018-10-16 ENCOUNTER — Telehealth: Payer: Self-pay | Admitting: *Deleted

## 2018-10-16 NOTE — Telephone Encounter (Signed)
LVM to call office back to inform her of below.Katharina Caper, April D, [place]

## 2018-10-16 NOTE — Telephone Encounter (Signed)
-----   Message from Sela Hilding, MD sent at 10/16/2018  8:45 AM EST ----- Please let patient know the labs don't show a cause of her dizziness (they are normal), as I explained to her that I expected. She should hopefully get some help with the dizziness from the medicine I sent.

## 2018-11-05 ENCOUNTER — Ambulatory Visit: Payer: Self-pay

## 2018-11-06 ENCOUNTER — Ambulatory Visit (INDEPENDENT_AMBULATORY_CARE_PROVIDER_SITE_OTHER): Payer: BLUE CROSS/BLUE SHIELD | Admitting: Family Medicine

## 2018-11-06 ENCOUNTER — Encounter: Payer: Self-pay | Admitting: Family Medicine

## 2018-11-06 ENCOUNTER — Other Ambulatory Visit: Payer: Self-pay

## 2018-11-06 VITALS — HR 73 | Temp 98.5°F | Ht 64.0 in | Wt 226.1 lb

## 2018-11-06 DIAGNOSIS — L739 Follicular disorder, unspecified: Secondary | ICD-10-CM

## 2018-11-06 DIAGNOSIS — M25552 Pain in left hip: Secondary | ICD-10-CM | POA: Diagnosis not present

## 2018-11-06 LAB — POCT URINALYSIS DIP (CLINITEK)
Bilirubin, UA: NEGATIVE
Blood, UA: NEGATIVE
Glucose, UA: NEGATIVE mg/dL
Ketones, POC UA: NEGATIVE mg/dL
Leukocytes, UA: NEGATIVE
Nitrite, UA: NEGATIVE
POC PROTEIN,UA: NEGATIVE
Spec Grav, UA: 1.02 (ref 1.010–1.025)
Urobilinogen, UA: 0.2 E.U./dL
pH, UA: 6 (ref 5.0–8.0)
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
## 2018-11-06 NOTE — Patient Instructions (Signed)
Warm compresses for the hair follicle.  Heat and ibuprofen.   Hip Exercises Ask your health care provider which exercises are safe for you. Do exercises exactly as told by your health care provider and adjust them as directed. It is normal to feel mild stretching, pulling, tightness, or discomfort as you do these exercises, but you should stop right away if you feel sudden pain or your pain gets worse.Do not begin these exercises until told by your health care provider. Stretching and range of motion exercises These exercises warm up your muscles and joints and improve the movement and flexibility of your hip. These exercises also help to relieve pain, numbness, and tingling. Exercise A: Hamstrings, supine  1. Lie on your back. 2. Loop a belt or towel over the ball of your left / rightfoot. The ball of your foot is on the walking surface, right under your toes. 3. Straighten your left / rightknee and slowly pull on the belt to raise your leg. ? Do not let your left / right knee bend while you do this. ? Keep your other leg flat on the floor. ? Raise the left / right leg until you feel a gentle stretch behind your left / right knee or thigh. 4. Hold this position for __________ seconds. 5. Slowly return your leg to the starting position. Repeat __________ times. Complete this stretch __________ times a day. Exercise B: Hip rotators  1. Lie on your back on a firm surface. 2. Hold your left / right knee with your left / right hand. Hold your ankle with your other hand. 3. Gently pull your left / right knee and rotate your lower leg toward your other shoulder. ? Pull until you feel a stretch in your buttocks. ? Keep your hips and shoulders firmly planted while you do this stretch. 4. Hold this position for __________ seconds. Repeat __________ times. Complete this stretch __________ times a day. Exercise C: V-sit (hamstrings and adductors)  1. Sit on the floor with your legs extended in  a large "V" shape. Keep your knees straight during this exercise. 2. Start with your head and chest upright, then bend at your waist to reach for your left foot (position A). You should feel a stretch in your right inner thigh. 3. Hold this position for __________ seconds. Then slowly return to the upright position. 4. Bend at your waist to reach forward (position B). You should feel a stretch behind both of your thighs and knees. 5. Hold this position for __________ seconds. Then slowly return to the upright position. 6. Bend at your waist to reach for your right foot (position C). You should feel a stretch in your left inner thigh. 7. Hold this position for __________ seconds. Then slowly return to the upright position. Repeat __________ times. Complete this stretch __________ times a day. Exercise D: Lunge (hip flexors)  1. Place your left / right knee on the floor and bend your other knee so that is directly over your ankle. You should be half-kneeling. 2. Keep good posture with your head over your shoulders. 3. Tighten your buttocks to point your tailbone downward. This helps your back to keep from arching too much. 4. You should feel a gentle stretch in the front of your left / right thigh and hip. If you do not feel any resistance, slightly slide your other foot forward and then slowly lunge forward so your knee once again lines up over your ankle. 5. Make sure your tailbone  continues to point downward. 6. Hold this position for __________ seconds. Repeat __________ times. Complete this stretch __________ times a day. Strengthening exercises These exercises build strength and endurance in your hip. Endurance is the ability to use your muscles for a long time, even after they get tired. Exercise E: Bridge (hip extensors)  1. Lie on your back on a firm surface with your knees bent and your feet flat on the floor. 2. Tighten your buttocks muscles and lift your bottom off the floor until the  trunk of your body is level with your thighs. ? Do not arch your back. ? You should feel the muscles working in your buttocks and the back of your thighs. If you do not feel these muscles, slide your feet 1-2 inches (2.5-5 cm) farther away from your buttocks. 3. Hold this position for __________ seconds. 4. Slowly lower your hips to the starting position. 5. Let your muscles relax completely between repetitions. 6. If this exercise is too easy, try doing it with your arms crossed over your chest. Repeat __________ times. Complete this exercise __________ times a day. Exercise F: Straight leg raises - hip abductors  1. Lie on your side with your left / right leg in the top position. Lie so your head, shoulder, knee, and hip line up with each other. You may bend your bottom knee to help you balance. 2. Roll your hips slightly forward, so your hips are stacked directly over each other and your left / right knee is facing forward. 3. Leading with your heel, lift your top leg 4-6 inches (10-15 cm). You should feel the muscles in your outer hip lifting. ? Do not let your foot drift forward. ? Do not let your knee roll toward the ceiling. 4. Hold this position for __________ seconds. 5. Slowly return to the starting position. 6. Let your muscles relax completely between repetitions. Repeat __________ times. Complete this exercise __________ times a day. Exercise G: Straight leg raises - hip adductors  1. Lie on your side with your left / right leg in the bottom position. Lie so your head, shoulder, knee, and hip line up. You may place your upper foot in front to help you balance. 2. Roll your hips slightly forward, so your hips are stacked directly over each other and your left / right knee is facing forward. 3. Tense the muscles in your inner thigh and lift your bottom leg 4-6 inches (10-15 cm). 4. Hold this position for __________ seconds. 5. Slowly return to the starting position. 6. Let your  muscles relax completely between repetitions. Repeat __________ times. Complete this exercise __________ times a day. Exercise H: Straight leg raises - quadriceps  1. Lie on your back with your left / right leg extended and your other knee bent. 2. Tense the muscles in the front of your left / right thigh. When you do this, you should see your kneecap slide up or see increased dimpling just above your knee. 3. Tighten these muscles even more and raise your leg 4-6 inches (10-15 cm) off the floor. 4. Hold this position for __________ seconds. 5. Keep these muscles tense as you lower your leg. 6. Relax the muscles slowly and completely between repetitions. Repeat __________ times. Complete this exercise __________ times a day. Exercise I: Hip abductors, standing 1. Tie one end of a rubber exercise band or tubing to a secure surface, such as a table or pole. 2. Loop the other end of the band or  tubing around your left / right ankle. 3. Keeping your ankle with the band or tubing directly opposite of the secured end, step away until there is tension in the tubing or band. Hold onto a chair as needed for balance. 4. Lift your left / right leg out to your side. While you do this: ? Keep your back upright. ? Keep your shoulders over your hips. ? Keep your toes pointing forward. ? Make sure to use your hip muscles to lift your leg. Do not "throw" your leg or tip your body to lift your leg. 5. Hold this position for __________ seconds. 6. Slowly return to the starting position. Repeat __________ times. Complete this exercise __________ times a day. Exercise J: Squats (quadriceps) 1. Stand in a door frame so your feet and knees are in line with the frame. You may place your hands on the frame for balance. 2. Slowly bend your knees and lower your hips like you are going to sit in a chair. ? Keep your lower legs in a straight-up-and-down position. ? Do not let your hips go lower than your knees. ? Do  not bend your knees lower than told by your health care provider. ? If your hip pain increases, do not bend as low. 3. Hold this position for ___________ seconds. 4. Slowly push with your legs to return to standing. Do not use your hands to pull yourself to standing. Repeat __________ times. Complete this exercise __________ times a day. This information is not intended to replace advice given to you by your health care provider. Make sure you discuss any questions you have with your health care provider. Document Released: 11/24/2005 Document Revised: 03/12/2018 Document Reviewed: 11/01/2015 Elsevier Interactive Patient Education  2019 [employer].

## 2018-11-06 NOTE — Assessment & Plan Note (Signed)
Groin pain is likely from her left hip as she is positive FADIR/FABER.  Recommended home exercises, heat, Tylenol/NSAIDs.  Discussed that she may benefit from physical therapy if no improvement with conservative measures.

## 2018-11-06 NOTE — Assessment & Plan Note (Signed)
Recommended warm compresses.  Area does not appear infected

## 2018-11-22 ENCOUNTER — Ambulatory Visit: Payer: Self-pay | Admitting: Family Medicine

## 2018-11-25 ENCOUNTER — Other Ambulatory Visit (HOSPITAL_COMMUNITY)
Admission: RE | Admit: 2018-11-25 | Discharge: 2018-11-25 | Disposition: A | Discharge status: Home / Self Care | Payer: BLUE CROSS/BLUE SHIELD | Source: Ambulatory Visit | Attending: Family Medicine | Admitting: Family Medicine

## 2018-11-25 ENCOUNTER — Ambulatory Visit (INDEPENDENT_AMBULATORY_CARE_PROVIDER_SITE_OTHER): Payer: BLUE CROSS/BLUE SHIELD | Admitting: Family Medicine

## 2018-11-25 ENCOUNTER — Other Ambulatory Visit: Payer: Self-pay

## 2018-11-25 VITALS — BP 116/60 | Temp 98.5°F | Wt 230.0 lb

## 2018-11-25 DIAGNOSIS — N898 Other specified noninflammatory disorders of vagina: Secondary | ICD-10-CM | POA: Diagnosis not present

## 2018-11-25 DIAGNOSIS — J029 Acute pharyngitis, unspecified: Secondary | ICD-10-CM

## 2018-11-25 LAB — POCT WET PREP (WET MOUNT)
Clue Cells Wet Prep Whiff POC: POSITIVE
Trichomonas Wet Prep HPF POC: ABSENT

## 2018-11-25 LAB — POCT RAPID STREP A (OFFICE): Rapid Strep A Screen: NEGATIVE

## 2018-11-25 MED ORDER — METRONIDAZOLE 500 MG PO TABS: 500.0000 mg | ORAL_TABLET | Freq: Two times a day (BID) | ORAL | 0 refills | Status: AC

## 2018-11-25 NOTE — Patient Instructions (Signed)
  Good to see you today!  We'll call you with the rest of your lab results  If you have questions or concerns please do not hesitate to call at [phone].  Lucila Maine, DO PGY-3, [facility] 11/25/2018 9:25 AM

## 2018-11-25 NOTE — Progress Notes (Signed)
    Subjective:    Patient ID: Christy Barnes, female    DOB: [birth year], 23 y.o.   MRN: [id number]   CC: vaginal discharge, sore throat  HPI:  patient complains of white vaginal discharge and discomfort. She reports some itching as well. She reports frequent yeast infections. She also has had BV several times. She had a new sexual partner in September, would like to be checked for STDs. Denies pelvic pain, dysuria, fevers, chills, vaginal lesions.  She also has had a sore throat for the past 3 days and noticed some white stuff in the back of her throat. She has cough and congestion as well. No fevers.   Smoking status reviewed- non-smoker  Review of Systems- see HPI   Objective:  BP 116/60   Temp 98.5 F (36.9 C) (Oral)   Wt 230 lb (104.3 kg)   LMP 10/28/2018 (Approximate)   BMI 39.48 kg/m  Vitals and nursing note reviewed  General: well nourished, in no acute distress HEENT: normocephalic, no scleral icterus or conjunctival pallor, no nasal discharge, moist mucous membranes, good dentition. No tonsillar exudate appreciated. Posterior oropharynx appears erythematous. Neck: supple, non-tender, without lymphadenopathy Cardiac: RRR, clear S1 and S2, no murmurs, rubs, or gallops Respiratory: clear to auscultation bilaterally, no increased work of breathing Abdomen: soft, nontender GU: normal external female genitalia without rash or lesion, white discharge present, no cervix lesions Skin: warm and dry, no rashes noted Neuro: alert and oriented, no focal deficits   Assessment & Plan:    1. Sore throat Likely viral URI. Strep swab negative. Discussed supportive care. Return if fails to improve or worsens. - Rapid Strep A  2. Vaginal discharge Wet prep + for BV, will treat w/ flagyl, gc/chalmydia sent to lab will inform patient of results. Patient verbalized understanding and agreement with plan. - POCT Wet Prep [facility]) - Cervicovaginal ancillary only   Return if  symptoms worsen or fail to improve.   Lucila Maine, DO Family Medicine Resident PGY-3

## 2018-11-26 LAB — CERVICOVAGINAL ANCILLARY ONLY
Chlamydia: NEGATIVE
Neisseria Gonorrhea: POSITIVE — AB

## 2018-11-27 ENCOUNTER — Ambulatory Visit (INDEPENDENT_AMBULATORY_CARE_PROVIDER_SITE_OTHER): Payer: BLUE CROSS/BLUE SHIELD | Admitting: *Deleted

## 2018-11-27 ENCOUNTER — Telehealth: Payer: Self-pay | Admitting: Family Medicine

## 2018-11-27 DIAGNOSIS — Z202 Contact with and (suspected) exposure to infections with a predominantly sexual mode of transmission: Secondary | ICD-10-CM | POA: Diagnosis not present

## 2018-11-27 DIAGNOSIS — A549 Gonococcal infection, unspecified: Secondary | ICD-10-CM

## 2018-11-27 MED ORDER — CEFTRIAXONE SODIUM 250 MG IJ SOLR
250.0000 mg | Freq: Once | INTRAMUSCULAR | Status: AC
  Administered 2018-11-27: 250 mg via INTRAMUSCULAR

## 2018-11-27 NOTE — Telephone Encounter (Signed)
I ordered 250 mg IM

## 2018-11-27 NOTE — Telephone Encounter (Signed)
Pt returned call.   Informed of results will come by @ 4pm today for treatment.  Dr. Vanetta Shawl,  What dosage of ceftriaxone do you want for patient. Christy Barnes, Salome Spotted, CMA

## 2018-11-27 NOTE — Progress Notes (Addendum)
Pt is here for STD treatment.  Treated with 250mg  of ceftriaxone IM in RUOQ.  Pt waited 15 minutes in office and has no side effects from medication.   While in office pt wants to know what she was tested for.  Advised that chlamydia and trichomonas were negative.  She was last tested for HIV / RPR in May 2019 but has had a new partner since September and is interested in "checking again" .  Spoke with Dr. Erin Hearing who gave verbal to have HIV and RPR drawn.  Orders placed and drawn today.  Communicable disease form completed and placed in to be faxed pile. [place], Salome Spotted, CMA

## 2018-11-27 NOTE — Telephone Encounter (Signed)
Perfect. Thanks.

## 2018-11-27 NOTE — Telephone Encounter (Signed)
  Attempted to call Ms. Meritt to discuss lab results, left VM for her to call back.  STD testing positive for gonorrhea, she will need to come in for IM CTX. Please let her know.   Lucila Maine, DO PGY-3, [facility] 11/27/2018 8:42 AM

## 2018-11-28 LAB — RPR: RPR Ser Ql: NONREACTIVE

## 2018-11-28 LAB — HIV ANTIBODY (ROUTINE TESTING W REFLEX): HIV Screen 4th Generation wRfx: NONREACTIVE

## 2018-12-04 ENCOUNTER — Ambulatory Visit (INDEPENDENT_AMBULATORY_CARE_PROVIDER_SITE_OTHER): Payer: BLUE CROSS/BLUE SHIELD | Admitting: Family Medicine

## 2018-12-04 ENCOUNTER — Other Ambulatory Visit: Payer: Self-pay

## 2018-12-04 ENCOUNTER — Ambulatory Visit: Payer: BLUE CROSS/BLUE SHIELD | Admitting: Family Medicine

## 2018-12-04 ENCOUNTER — Other Ambulatory Visit (HOSPITAL_COMMUNITY)
Admission: RE | Admit: 2018-12-04 | Discharge: 2018-12-04 | Disposition: A | Discharge status: Home / Self Care | Payer: BLUE CROSS/BLUE SHIELD | Source: Ambulatory Visit | Attending: Family Medicine | Admitting: Family Medicine

## 2018-12-04 ENCOUNTER — Encounter: Payer: Self-pay | Admitting: Family Medicine

## 2018-12-04 VITALS — BP 110/78 | Temp 98.3°F | Wt 234.0 lb

## 2018-12-04 DIAGNOSIS — J029 Acute pharyngitis, unspecified: Secondary | ICD-10-CM

## 2018-12-04 DIAGNOSIS — N898 Other specified noninflammatory disorders of vagina: Secondary | ICD-10-CM | POA: Diagnosis not present

## 2018-12-04 LAB — POCT WET PREP (WET MOUNT)
Clue Cells Wet Prep Whiff POC: POSITIVE
Trichomonas Wet Prep HPF POC: ABSENT

## 2018-12-04 MED ORDER — METRONIDAZOLE 500 MG PO TABS: 500.0000 mg | ORAL_TABLET | Freq: Two times a day (BID) | ORAL | 0 refills | Status: AC

## 2018-12-04 NOTE — Patient Instructions (Signed)
It was good to see you today!   We are checking some labs today. If results require attention, either myself or my nurse will get in touch with you. If everything is normal, you will get a letter in the mail or a message in My Chart. Please give [place] a call if you do not hear from [place] after 2 weeks.  Please bring all of your medications with you to each visit.   Sign up for My Chart to have easy access to your labs results, and communication with your primary care physician.  Feel free to call with any questions or concerns at any time, at [phone].   Take care,  Dr. Bufford Lope, [place]

## 2018-12-04 NOTE — Progress Notes (Signed)
    Subjective:  Christy Barnes is a 23 y.o. female who presents to the [facility] today with a chief complaint of vaginal discharge.   HPI:  Patient states that she was recently treated for gonorrhea and that she has had 2 days of vaginal discharge and burning sensation.  She has had no bleeding.  No urinary symptoms.  No foul odor.  She has not had any type of sexual activity since last testing.   She does endorse a sore throat for the last few days.  No ulcers or rashes.  No fever.  Does have some rhinorrhea.  No nasal congestion.  No coughing  ROS: Per HPI   Objective:  Physical Exam: BP 110/78   Temp 98.3 F (36.8 C) (Oral)   Wt 234 lb (106.1 kg)   BMI 40.17 kg/m   Gen: NAD, resting comfortably HEENT: [place], AT. TM pearly bilaterally. Oropharynx nonerythematous, no exudates, no ulcers. Nasal mucosa healthy. CV: RRR with no murmurs appreciated Pulm: NWOB, CTAB with no crackles, wheezes, or rhonchi GI: Normal bowel sounds present. Soft, Nontender, Nondistended. MSK: no edema, cyanosis, or clubbing noted Pelvic exam: normal external genitalia, vulva, vagina, cervix Skin: warm, dry Neuro: grossly normal, moves all extremities Psych: Normal affect and thought content  Results for orders placed or performed in visit on 12/04/18 (from the past 72 hour(s))  POCT Wet Prep [facility])     Status: Abnormal   Collection Time: 12/04/18  3:10 PM  Result Value Ref Range   Source Wet Prep POC VAG    WBC, Wet Prep HPF POC NONE    Bacteria Wet Prep HPF POC Few Few   Clue Cells Wet Prep HPF POC Few (A) None   Clue Cells Wet Prep Whiff POC Positive Whiff    Yeast Wet Prep HPF POC None None   Trichomonas Wet Prep HPF POC Absent Absent     Assessment/Plan:  1. Vaginal discharge Called patient and left message that wet prep is showing bacterial vaginosis.  Rx for metronidazole twice daily for 7 days sent to patient pharmacy. - POCT Wet Prep [employer] [facility])  2. Sore throat Discussed that likely  viral URI however given her recent gonorrhea patient requested oropharyngeal testing. Obtained today.   Bufford Lope, DO PGY-3, [facility] 12/04/2018 3:01 PM

## 2018-12-09 ENCOUNTER — Encounter: Payer: Self-pay | Admitting: Family Medicine

## 2018-12-09 LAB — CERVICOVAGINAL ANCILLARY ONLY
Chlamydia: NEGATIVE
Neisseria Gonorrhea: NEGATIVE
Trichomonas: NEGATIVE

## 2018-12-19 ENCOUNTER — Encounter: Payer: Self-pay | Admitting: Family Medicine

## 2018-12-19 ENCOUNTER — Ambulatory Visit (INDEPENDENT_AMBULATORY_CARE_PROVIDER_SITE_OTHER): Payer: BLUE CROSS/BLUE SHIELD | Admitting: Family Medicine

## 2018-12-19 VITALS — BP 114/60 | HR 105 | Temp 99.2°F | Wt 231.4 lb

## 2018-12-19 DIAGNOSIS — J209 Acute bronchitis, unspecified: Secondary | ICD-10-CM | POA: Diagnosis not present

## 2018-12-19 MED ORDER — BENZONATATE 100 MG PO CAPS: 100.0000 mg | ORAL_CAPSULE | Freq: Two times a day (BID) | ORAL | 0 refills | Status: DC | PRN

## 2018-12-19 MED ORDER — AZITHROMYCIN 250 MG PO TABS: ORAL_TABLET | ORAL | 0 refills | Status: DC

## 2018-12-19 NOTE — Progress Notes (Signed)
Subjective:     Patient ID: Christy Barnes, female   DOB: 1996-09-14, 24 y.o.   MRN: [id number]  URI   This is a new problem. The current episode started 1 to 4 weeks ago (Started 2 weeks ago). The problem has been gradually worsening (Started with sorethroat which then progressed to cough). Maximum temperature: Low grade fever at home. Associated symptoms include coughing. Pertinent negatives include no chest pain, headaches, nausea, sore throat, vomiting or wheezing. Associated symptoms comments: She coughs up sputum which is yellowish brown in color. No blood. She has tried antihistamine (OTC cough medicine, multiple) for the symptoms. The treatment provided no relief.   Current Outpatient Medications on File Prior to Visit  Medication Sig Dispense Refill  . cetirizine (ZYRTEC) 5 MG tablet Take 1 tablet (5 mg total) by mouth at bedtime. 90 tablet 3  . EPINEPHrine 0.3 mg/0.3 mL IJ SOAJ injection Inject 0.3 mLs (0.3 mg total) into the skin as needed (Anaphylaxis). 1 Device 1  . loratadine-pseudoephedrine (CLARITIN-D 24 HOUR) 10-240 MG 24 hr tablet Take 1 tablet by mouth daily. 30 tablet 1  . meclizine (ANTIVERT) 25 MG tablet Take 1 tablet (25 mg total) by mouth 3 (three) times daily as needed for dizziness. 30 tablet 0   No current facility-administered medications on file prior to visit.    Past Medical History:  Diagnosis Date  . Labial tear freshman year  . Overweight and obesity(278.0)   . Sickle cell trait ([place]) 06/07/2017   Vitals:   12/19/18 1056  BP: 114/60  Pulse: (!) 105  Temp: 99.2 F (37.3 C)  TempSrc: Oral  SpO2: 98%  Weight: 231 lb 6 oz (105 kg)     Review of Systems  HENT: Negative for sore throat.   Respiratory: Positive for cough. Negative for wheezing.   Cardiovascular: Negative for chest pain.  Gastrointestinal: Negative for nausea and vomiting.  Neurological: Negative for headaches.  All other systems reviewed and are negative.      Objective:   Physical Exam Vitals signs and nursing note reviewed.  Constitutional:      Appearance: Normal appearance. She is not ill-appearing.  HENT:     Right Ear: Tympanic membrane and ear canal normal.     Left Ear: Tympanic membrane and ear canal normal.     Mouth/Throat:     Mouth: Mucous membranes are moist.     Pharynx: No oropharyngeal exudate or posterior oropharyngeal erythema.  Eyes:     Extraocular Movements: Extraocular movements intact.     Conjunctiva/sclera: Conjunctivae normal.     Pupils: Pupils are equal, round, and reactive to light.  Neck:     Musculoskeletal: Normal range of motion.  Cardiovascular:     Rate and Rhythm: Normal rate and regular rhythm.     Pulses: Normal pulses.     Heart sounds: Normal heart sounds. No murmur.  Pulmonary:     Effort: Pulmonary effort is normal. No respiratory distress.     Breath sounds: Normal breath sounds. No wheezing or rhonchi.  Abdominal:     General: Abdomen is flat. Bowel sounds are normal. There is no distension.     Palpations: There is no mass.     Tenderness: There is no abdominal tenderness.  Lymphadenopathy:     Cervical: No cervical adenopathy.  Neurological:     Mental Status: She is alert.        Assessment:     Cough    Plan:  More than 2 weeks. Treat for bronchitis. ZPak started. Tessalon prn cough. F/U as needed.

## 2018-12-19 NOTE — Patient Instructions (Signed)
Acute Bronchitis, Adult Acute bronchitis is when air tubes (bronchi) in the lungs suddenly get swollen. The condition can make it hard to breathe. It can also cause these symptoms:  A cough.  Coughing up clear, yellow, or green mucus.  Wheezing.  Chest congestion.  Shortness of breath.  A fever.  Body aches.  Chills.  A sore throat. Follow these instructions at home:  Medicines  Take over-the-counter and prescription medicines only as told by your doctor.  If you were prescribed an antibiotic medicine, take it as told by your doctor. Do not stop taking the antibiotic even if you start to feel better. General instructions  Rest.  Drink enough fluids to keep your pee (urine) pale yellow.  Avoid smoking and secondhand smoke. If you smoke and you need help quitting, ask your doctor. Quitting will help your lungs heal faster.  Use an inhaler, cool mist vaporizer, or humidifier as told by your doctor.  Keep all follow-up visits as told by your doctor. This is important. How is this prevented? To lower your risk of getting this condition again:  Wash your hands often with soap and water. If you cannot use soap and water, use hand sanitizer.  Avoid contact with people who have cold symptoms.  Try not to touch your hands to your mouth, nose, or eyes.  Make sure to get the flu shot every year. Contact a doctor if:  Your symptoms do not get better in 2 weeks. Get help right away if:  You cough up blood.  You have chest pain.  You have very bad shortness of breath.  You become dehydrated.  You faint (pass out) or keep feeling like you are going to pass out.  You keep throwing up (vomiting).  You have a very bad headache.  Your fever or chills gets worse. This information is not intended to replace advice given to you by your health care provider. Make sure you discuss any questions you have with your health care provider. Document Released: 04/24/2008 Document  Revised: 06/20/2017 Document Reviewed: 04/26/2016 Elsevier Interactive Patient Education  2019 [employer]  

## 2018-12-19 NOTE — Progress Notes (Signed)
.  str

## 2019-01-09 ENCOUNTER — Ambulatory Visit: Payer: Self-pay | Admitting: Family Medicine

## 2019-01-09 ENCOUNTER — Ambulatory Visit (INDEPENDENT_AMBULATORY_CARE_PROVIDER_SITE_OTHER): Payer: BLUE CROSS/BLUE SHIELD | Admitting: Family Medicine

## 2019-01-09 ENCOUNTER — Other Ambulatory Visit: Payer: Self-pay

## 2019-01-09 ENCOUNTER — Ambulatory Visit: Payer: Self-pay

## 2019-01-09 ENCOUNTER — Encounter: Payer: Self-pay | Admitting: Family Medicine

## 2019-01-09 VITALS — BP 100/64 | HR 70 | Temp 98.7°F | Wt 229.2 lb

## 2019-01-09 DIAGNOSIS — L700 Acne vulgaris: Secondary | ICD-10-CM

## 2019-01-09 DIAGNOSIS — H938X3 Other specified disorders of ear, bilateral: Secondary | ICD-10-CM | POA: Diagnosis not present

## 2019-01-09 MED ORDER — FLUTICASONE PROPIONATE 50 MCG/ACT NA SUSP: 2.0000 | Freq: Every day | NASAL | 6 refills | Status: DC

## 2019-01-09 NOTE — Progress Notes (Signed)
   CC: ear fullness, bump on the lip  HPI  Ear fullness - hx of allergies, worse at certain times of the year. Also had a cold 2 weeks ago. She tried zyrtec from previous rx and didn't seem to help much. claritin also didn't seem to help w ear fullness in the past. Has never tried any nasal spray. No ENT hx or hx of surgeries in her ears. Denies congestion and sinus pain.   Bump on her lip - was worried about herpes. No hx of this. Tried aloe vera. Tender underneath, present for 2-3 days. Has acne on the remainder of her face, uses 2 cleansers OTC, tries to wipe her phone down and change pillowcases frequently.   ROS: Denies CP, SOB, abdominal pain, dysuria, changes in BMs.   CC, SH/smoking status, and VS noted  Objective: BP 100/64   Pulse 70   Temp 98.7 F (37.1 C) (Oral)   Wt 229 lb 4 oz (104 kg)   LMP 01/09/2019 Comment: currently on 01/09/2019  SpO2 99%   BMI 39.35 kg/m  Gen: NAD, alert, cooperative, and pleasant. HEENT: NCAT, EOMI, PERRL, TMs and oropharnyx clear bilaterally. Open comedone over R upper lateral lip with small scab overlying, tender to palpation.  CV: RRR, no murmur Resp: CTAB, no wheezes, non-labored Ext: No edema, warm Neuro: Alert and oriented, Speech clear, No gross deficits  Assessment and plan:  Ear fullness - suspect allergic component, trial flonase. TMs normal.   Acne- reassured, unlikely HSV. Continue cleansing, can apply salicylic acid to the breakouts via OTC products.   Meds ordered this encounter  Medications  . fluticasone (FLONASE) 50 MCG/ACT nasal spray    Sig: Place 2 sprays into both nostrils daily.    Dispense:  16 g    Refill:  6   Ralene Ok, MD, PGY3 01/10/2019 12:10 PM

## 2019-01-09 NOTE — Patient Instructions (Signed)
It was a pleasure to see you today! Thank you for choosing [facility] for your primary care. Christy Barnes was seen for ear fullness, acne.   Our plans for today were:  For your ear fullness, try the nasal spray for 2 weeks.   For your acne, pick up an over the counter treatment product that contains salicylic acid. This should be applied to the acne bump.   Best,  Dr. Lindell Noe

## 2019-01-17 ENCOUNTER — Other Ambulatory Visit (HOSPITAL_COMMUNITY)
Admission: RE | Admit: 2019-01-17 | Discharge: 2019-01-17 | Disposition: A | Discharge status: Home / Self Care | Payer: BLUE CROSS/BLUE SHIELD | Source: Ambulatory Visit | Attending: Family Medicine | Admitting: Family Medicine

## 2019-01-17 ENCOUNTER — Ambulatory Visit (INDEPENDENT_AMBULATORY_CARE_PROVIDER_SITE_OTHER): Payer: BLUE CROSS/BLUE SHIELD | Admitting: Family Medicine

## 2019-01-17 ENCOUNTER — Other Ambulatory Visit: Payer: Self-pay

## 2019-01-17 ENCOUNTER — Encounter: Payer: Self-pay | Admitting: Family Medicine

## 2019-01-17 VITALS — BP 110/64 | HR 78 | Temp 98.5°F | Wt 230.0 lb

## 2019-01-17 DIAGNOSIS — J029 Acute pharyngitis, unspecified: Secondary | ICD-10-CM

## 2019-01-17 DIAGNOSIS — Z23 Encounter for immunization: Secondary | ICD-10-CM | POA: Diagnosis not present

## 2019-01-17 LAB — POCT RAPID STREP A (OFFICE): Rapid Strep A Screen: NEGATIVE

## 2019-01-17 NOTE — Assessment & Plan Note (Signed)
Neg for strep pharyngitis, although unlikely given hx. Given patient's sx and findings of tonsilloliths bilaterally, could be cause of sx. However patient does also endorse having oral sex. Swabbed for GC/Chlamydia as well.  - Patient to gargle salt water and attempt to remove tonsil stones with soft object at home if desired - no abx indicated at this time, will f/u on GC/C swab results

## 2019-01-17 NOTE — Progress Notes (Signed)
  Subjective:  Patient ID: Christy Barnes  DOB: 02/08/1996 MRN: [id number]  Christy Barnes is a 23 y.o. female with a PMH of MDD, obesity here today for sore throat.   HPI:  Sore throat Patient reports that her throat has been sore for the past 3-4 days.  Reports that she has not had any change in her voice.  States that she has been having some trouble swallowing but this has been improving over the past few days.  Patient denies any fevers or chills.  Denies any recent sick contacts.  Patient reports that she has not had any swelling in her neck.  Denies any worsening breath.  Patient denies any history of heartburn or indigestion.  Denies any rhinorrhea or URI symptoms.  She does states that she has had oral intercourse.  ROS: All other systems otherwise negative, except as mentioned in HPI  Social hx: Reports having oral sex Smoking status reviewed  Patient Active Problem List   Diagnosis Date Noted  . Sore throat 01/17/2019  . MDD (major depressive disorder), severe ([place]) 11/09/2017  . Impulse control disorder 11/09/2017  . Allergic rhinitis 05/08/2014  . Obesity (BMI 30-39.9) 11/04/2013  . Presence of subdermal contraceptive device 05/04/2013     Objective:  BP 110/64   Pulse 78   Temp 98.5 F (36.9 C) (Oral)   Wt 230 lb (104.3 kg)   LMP 01/09/2019 Comment: currently on 01/09/2019  SpO2 100%   BMI 39.48 kg/m   Vitals and nursing note reviewed  General: NAD, pleasant HEENT: Oropharynx with grade 2 tonsils with tonsilloliths present bilaterally, no erythema or exudate noted. Uvula midline Neck: No cervical lymphadenopathy. No masses noted on neck.  Extremities: no edema or cyanosis. WWP. Skin: warm and dry, no rashes noted Neuro: alert and oriented, no focal deficits Psych: normal affect, normal thought content  Assessment & Plan:   Sore throat Neg for strep pharyngitis, although unlikely given hx. Given patient's sx and findings of tonsilloliths bilaterally,  could be cause of sx. However patient does also endorse having oral sex. Swabbed for GC/Chlamydia as well.  - Patient to gargle salt water and attempt to remove tonsil stones with soft object at home if desired - no abx indicated at this time, will f/u on GC/C swab results   [place] Jalasia Eskridge, [place] Resident PGY-2

## 2019-01-17 NOTE — Patient Instructions (Addendum)
Thank you for coming to see me today. It was a pleasure! Today we talked about:   You do not have strep throat at this time.  It is likely related to the tonsil stones on exam causing the discomfort when you follow-up.  We have also swabbed for gonorrhea and chlamydia and will release these results on my chart if they are normal or call you if they are abnormal.  Use a Q-tip or other soft item in order to try to remove the tonsil stones for the back of your throat.  You may also try gargling warm water with salt.  Please follow-up as needed.  If you have any questions or concerns, please do not hesitate to call the office at [phone].  Take Care,   [place] Leodan Bolyard, DO

## 2019-01-20 LAB — CERVICOVAGINAL ANCILLARY ONLY
Chlamydia: NEGATIVE
Neisseria Gonorrhea: NEGATIVE
Trichomonas: NEGATIVE

## 2019-03-26 ENCOUNTER — Other Ambulatory Visit (HOSPITAL_COMMUNITY)
Admission: RE | Admit: 2019-03-26 | Discharge: 2019-03-26 | Disposition: A | Discharge status: Home / Self Care | Payer: BLUE CROSS/BLUE SHIELD | Source: Ambulatory Visit | Attending: Family Medicine | Admitting: Family Medicine

## 2019-03-26 ENCOUNTER — Other Ambulatory Visit: Payer: Self-pay

## 2019-03-26 ENCOUNTER — Encounter: Payer: Self-pay | Admitting: Family Medicine

## 2019-03-26 ENCOUNTER — Ambulatory Visit (INDEPENDENT_AMBULATORY_CARE_PROVIDER_SITE_OTHER): Payer: BLUE CROSS/BLUE SHIELD | Admitting: Family Medicine

## 2019-03-26 VITALS — BP 130/75 | HR 76 | Wt 225.0 lb

## 2019-03-26 DIAGNOSIS — Z3046 Encounter for surveillance of implantable subdermal contraceptive: Secondary | ICD-10-CM

## 2019-03-26 DIAGNOSIS — Z30019 Encounter for initial prescription of contraceptives, unspecified: Secondary | ICD-10-CM | POA: Diagnosis not present

## 2019-03-26 DIAGNOSIS — Z309 Encounter for contraceptive management, unspecified: Secondary | ICD-10-CM | POA: Insufficient documentation

## 2019-03-26 DIAGNOSIS — N898 Other specified noninflammatory disorders of vagina: Secondary | ICD-10-CM | POA: Insufficient documentation

## 2019-03-26 DIAGNOSIS — Z3009 Encounter for other general counseling and advice on contraception: Secondary | ICD-10-CM | POA: Diagnosis not present

## 2019-03-26 DIAGNOSIS — Z113 Encounter for screening for infections with a predominantly sexual mode of transmission: Secondary | ICD-10-CM

## 2019-03-26 DIAGNOSIS — B9689 Other specified bacterial agents as the cause of diseases classified elsewhere: Secondary | ICD-10-CM | POA: Insufficient documentation

## 2019-03-26 LAB — POCT WET PREP (WET MOUNT)
Clue Cells Wet Prep Whiff POC: NEGATIVE
Trichomonas Wet Prep HPF POC: ABSENT

## 2019-03-26 LAB — POCT URINE PREGNANCY: Preg Test, Ur: NEGATIVE

## 2019-03-26 MED ORDER — ETONOGESTREL 68 MG ~~LOC~~ IMPL
68.0000 mg | DRUG_IMPLANT | Freq: Once | SUBCUTANEOUS | Status: AC
  Administered 2019-03-26: 68 mg via SUBCUTANEOUS

## 2019-03-26 NOTE — Assessment & Plan Note (Addendum)
Nexplanon placed.  UPreg negative.

## 2019-03-26 NOTE — Assessment & Plan Note (Signed)
Screening for STIs.  Few yeast on wet prep, remainder negative.  GC and chlamydia pending.  Will wait for remainder of testing before potentially treating patient.

## 2019-03-26 NOTE — Progress Notes (Signed)
Acute Office Visit  Subjective:    Patient ID: Christy Barnes, female    DOB: 1996/05/23, 23 y.o.   MRN: [id number]  Chief Complaint  Patient presents with  . Vaginal Discharge    Vaginal Discharge  The patient's primary symptoms include pelvic pain and vaginal discharge. The patient's pertinent negatives include no genital itching, genital lesions, genital odor, genital rash, missed menses or vaginal bleeding. This is a new problem. Episode onset: 5 days. The problem has been waxing and waning. The pain is mild. The problem affects the left side. She is not pregnant. Pertinent negatives include no abdominal pain, back pain, chills, constipation, diarrhea, discolored urine, dysuria, fever, flank pain, frequency, headaches, hematuria, joint pain, joint swelling, nausea, painful intercourse, rash, sore throat or urgency. The vaginal discharge was milky. There has been no bleeding. She has not been passing clots. She has not been passing tissue. Nothing aggravates the symptoms. Treatments tried: boric acid. The treatment provided mild relief. She is sexually active. It is unknown whether or not her partner has an STD. She uses condoms for contraception. Her menstrual history has been regular. Her past medical history is significant for an STD and vaginosis. There is no history of an abdominal surgery, a Cesarean section, an ectopic pregnancy, endometriosis, a gynecological surgery, herpes simplex, menorrhagia, metrorrhagia, miscarriage, ovarian cysts, perineal abscess or PID.    Past Medical History:  Diagnosis Date  . Labial tear freshman year  . Overweight and obesity(278.0)   . Sickle cell trait ([place]) 06/07/2017    Past Surgical History:  Procedure Laterality Date  . labia repair  freshman year    Family History  Problem Relation Age of Onset  . Thyroid disease Mother     Social History   Socioeconomic History  . Marital status: Single    Spouse name: Not on file  . Number of  children: Not on file  . Years of education: Not on file  . Highest education level: Not on file  Occupational History  . Not on file  Social Needs  . Financial resource strain: Not on file  . Food insecurity:    Worry: Not on file    Inability: Not on file  . Transportation needs:    Medical: Not on file    Non-medical: Not on file  Tobacco Use  . Smoking status: Never Smoker  . Smokeless tobacco: Never Used  Substance and Sexual Activity  . Alcohol use: Yes    Alcohol/week: 1.0 standard drinks    Types: 1 Glasses of wine per week    Comment: "socially"  . Drug use: No  . Sexual activity: Not Currently    Birth control/protection: Implant    Comment: 2 weeks ago protected.Marland Kitchen 05-02-16 Unprotected 1 month ago   Lifestyle  . Physical activity:    Days per week: Not on file    Minutes per session: Not on file  . Stress: Not on file  Relationships  . Social connections:    Talks on phone: Not on file    Gets together: Not on file    Attends religious service: Not on file    Active member of club or organization: Not on file    Attends meetings of clubs or organizations: Not on file    Relationship status: Not on file  . Intimate partner violence:    Fear of current or ex partner: Not on file    Emotionally abused: Not on file    Physically  abused: Not on file    Forced sexual activity: Not on file  Other Topics Concern  . Not on file  Social History Narrative  . Not on file    Outpatient Medications Prior to Visit  Medication Sig Dispense Refill  . EPINEPHrine 0.3 mg/0.3 mL IJ SOAJ injection Inject 0.3 mLs (0.3 mg total) into the skin as needed (Anaphylaxis). 1 Device 1  . fluticasone (FLONASE) 50 MCG/ACT nasal spray Place 2 sprays into both nostrils daily. 16 g 6   No facility-administered medications prior to visit.     Allergies  Allergen Reactions  . Hydrocortisone Nausea Only  . Fish Allergy   . Morphine And Related Itching  . Peanut-Containing Drug  Products   . Shrimp [Shellfish Allergy]     Review of Systems  Constitutional: Negative for chills and fever.  HENT: Negative for sore throat.   Gastrointestinal: Negative for abdominal pain, constipation, diarrhea and nausea.  Genitourinary: Positive for pelvic pain and vaginal discharge. Negative for dysuria, flank pain, frequency, hematuria, menorrhagia, missed menses and urgency.  Musculoskeletal: Negative for back pain and joint pain.  Skin: Negative for rash.  Neurological: Negative for headaches.       Objective:    Physical Exam  Constitutional: She appears well-developed and well-nourished.  HENT:  Head: Normocephalic and atraumatic.  Eyes: Conjunctivae are normal. No scleral icterus.  Neck: Neck supple. No JVD present.  Abdominal: Soft. She exhibits no distension.  Genitourinary:    Vagina normal.   Musculoskeletal: Normal range of motion.        General: No edema.  Neurological: She is alert. She exhibits normal muscle tone.  Skin: Skin is warm and dry.  Psychiatric: She has a normal mood and affect. Her behavior is normal.    BP 130/75   Pulse 76   Wt 225 lb (102.1 kg)   LMP 03/05/2019   SpO2 98%   BMI 38.62 kg/m  Wt Readings from Last 3 Encounters:  03/26/19 225 lb (102.1 kg)  01/17/19 230 lb (104.3 kg)  01/09/19 229 lb 4 oz (104 kg)    There are no preventive care reminders to display for this patient.  There are no preventive care reminders to display for this patient.  Results for orders placed or performed in visit on 03/26/19 (from the past 24 hour(s))  POCT urine pregnancy     Status: None   Collection Time: 03/26/19  8:50 AM  Result Value Ref Range   Preg Test, Ur Negative Negative  POCT Wet Prep [facility])     Status: Abnormal   Collection Time: 03/26/19  9:00 AM  Result Value Ref Range   Source Wet Prep POC VAG    WBC, Wet Prep HPF POC NONE    Bacteria Wet Prep HPF POC Few Few   Clue Cells Wet Prep HPF POC None None   Clue Cells Wet  Prep Whiff POC Negative Whiff    Yeast Wet Prep HPF POC Few (A) None   Trichomonas Wet Prep HPF POC Absent Absent     Lab Results  Component Value Date   TSH 2.413 04/15/2013   Lab Results  Component Value Date   WBC 5.3 10/14/2018   HGB 13.4 10/14/2018   HCT 39.3 10/14/2018   MCV 93 10/14/2018   PLT 270 10/14/2018   Lab Results  Component Value Date   NA 140 10/14/2018   K 4.1 10/14/2018   CO2 22 10/14/2018   GLUCOSE 90 10/14/2018  BUN 11 10/14/2018   CREATININE 1.07 (H) 10/14/2018   BILITOT 1.1 08/10/2018   ALKPHOS 53 08/10/2018   AST 26 08/10/2018   ALT 15 08/10/2018   PROT 8.0 08/10/2018   ALBUMIN 4.4 08/10/2018   CALCIUM 9.3 10/14/2018   ANIONGAP 10 08/10/2018   Lab Results  Component Value Date   CHOL 153 06/25/2015   Lab Results  Component Value Date   HDL 69 06/25/2015   Lab Results  Component Value Date   LDLCALC 119 (H) 11/04/2013   Lab Results  Component Value Date   TRIG 39 11/04/2013   Lab Results  Component Value Date   CHOLHDL 3.0 11/04/2013   Lab Results  Component Value Date   HGBA1C 5.2 06/07/2017       Assessment & Plan:    Problem List Items Addressed This Visit      Other   Vaginal discharge    Screening for STIs.  Few yeast on wet prep, remainder negative.  GC and chlamydia pending.  Will wait for remainder of testing before potentially treating patient.      Relevant Orders   POCT Wet Prep [facility]) (Completed)   Cervicovaginal ancillary only   Birth control counseling - Primary    Nexplanon placed.  UPreg negative.      Relevant Orders   POCT urine pregnancy (Completed)    Other Visit Diagnoses    Screening for STD (sexually transmitted disease)       Relevant Orders   HIV antibody (with reflex)   RPR       No orders of the defined types were placed in this encounter.    Bonnita Hollow, MD

## 2019-03-26 NOTE — Addendum Note (Signed)
Addended by: Christen Bame D on: 03/26/2019 12:25 PM   Modules accepted: Orders

## 2019-03-26 NOTE — Progress Notes (Signed)
PRE-OP DIAGNOSIS: desired long-term, reversible contraception  POST-OP DIAGNOSIS: Same  PROCEDURE: Nexplanon  placement Performing Physician: Josephine Igo  Supervising Physician (if applicable):    PROCEDURE:  Site (check):       [ ]       Right Arm        [x]      Left Arm        Serial # _ Sterile Preparation:    [x]       Betadine        [_]     Chloraprep          Expiration Date [_]   Insertion site was selected 8 - 10 cm from medial epicondyle and marked along with guiding site using sterile marker Procedure area was prepped and draped in a sterile fashion. 5 mL of 1% lidocaine with epinephrine used for subcutaneous anesthesia. Anesthesia confirmed.  Nexplanon  trocar was inserted subcutaneously and then Nexplanon  capsule delivered subcutaneously Trocar was removed from the insertion site. Nexplanon  capsule was palpated by provider and patient to assure satisfactory placement. Estimated blood loss of 1  mL Dressings applied:     x   Adhesive Dressing     _  Gauze/Tape     _   Biocclusive Followup: The patient tolerated the procedure well without complications.  Standard post-procedure care is explained and return precautions are given.

## 2019-03-26 NOTE — Patient Instructions (Signed)
It was a pleasure to see you today! Thank you for choosing [facility] for your primary care. Christy Barnes was seen for STI check and nexplanon placement.  You were negative for trichomonas and bacterial vaginosis. You did have a few yeast on exam, however, this is not clinically warrant treatment at this moment. We can wait on the gonorrhea and chlamydia testing before assessing the need to treat.   Your nexplanon was safely inserted in your left arm. Please call if you have any issues associated with it.    Nexplanon Instructions After Insertion   Keep bandage clean and dry for 24 hours   May use ice/Tylenol/Ibuprofen for soreness or pain   If you develop fever, drainage or increased warmth from incision site-contact office immediately    Best,  Marny Lowenstein, MD, [place] - PGY1 03/26/2019 9:24 AM

## 2019-03-27 LAB — RPR: RPR Ser Ql: NONREACTIVE

## 2019-03-27 LAB — CERVICOVAGINAL ANCILLARY ONLY
Chlamydia: NEGATIVE
Neisseria Gonorrhea: NEGATIVE

## 2019-03-27 LAB — HIV ANTIBODY (ROUTINE TESTING W REFLEX): HIV Screen 4th Generation wRfx: NONREACTIVE

## 2019-03-28 ENCOUNTER — Encounter: Payer: Self-pay | Admitting: Family Medicine

## 2019-04-09 ENCOUNTER — Other Ambulatory Visit: Payer: Self-pay

## 2019-04-09 ENCOUNTER — Ambulatory Visit: Payer: BLUE CROSS/BLUE SHIELD | Admitting: Family Medicine

## 2019-04-09 ENCOUNTER — Ambulatory Visit (INDEPENDENT_AMBULATORY_CARE_PROVIDER_SITE_OTHER): Payer: BLUE CROSS/BLUE SHIELD | Admitting: Family Medicine

## 2019-04-09 ENCOUNTER — Encounter: Payer: Self-pay | Admitting: Family Medicine

## 2019-04-09 VITALS — BP 116/72 | HR 79

## 2019-04-09 DIAGNOSIS — B3731 Acute candidiasis of vulva and vagina: Secondary | ICD-10-CM

## 2019-04-09 DIAGNOSIS — Z309 Encounter for contraceptive management, unspecified: Secondary | ICD-10-CM

## 2019-04-09 DIAGNOSIS — B373 Candidiasis of vulva and vagina: Secondary | ICD-10-CM

## 2019-04-09 DIAGNOSIS — Z3046 Encounter for surveillance of implantable subdermal contraceptive: Secondary | ICD-10-CM

## 2019-04-09 MED ORDER — FLUCONAZOLE 150 MG PO TABS: 150.0000 mg | ORAL_TABLET | Freq: Once | ORAL | 0 refills | Status: AC

## 2019-04-09 NOTE — Progress Notes (Signed)
   Subjective:    Patient ID: Christy Barnes, female    DOB: 1996-07-08, 23 y.o.   MRN: [id number]   CC: Nexplanon Removal  HPI: Recently got her nexplanon put back in about 2 weeks ago (May 6), 2 days after started having itching at the site with appearance of a rash that spread across chest and to R back. Rash is not present today, but when is present looks like hives. The rash is not present every day but the site is very itchy. No other real patter to the rashes' appearance. The patient was informed that the nexplanon is most likely not causing her rash but still wishes to have it removed. She does not currently desire any alternative form of birth control and voices understanding that this means she is at greater risk of pregnancy should she have unprotected sex.   Smoking status reviewed: used to smoke, quit 2 years ago  Review of Systems: Review of Systems  Constitutional: Negative for chills, fever and malaise/fatigue.  HENT: Negative for congestion, sinus pain and sore throat.   Respiratory: Negative for cough and shortness of breath.   Musculoskeletal: Negative for myalgias.  Skin: Positive for itching and rash.   Objective:  BP 116/72   Pulse 79   SpO2 99%  Vitals and nursing note reviewed  General: well nourished, NAD Cardiac: RRR, clear S1 and S2, no M, R, or G Respiratory: CTA bilaterally, normal work of breathing Abdomen: soft, nontender, no masses or organomegaly. +BS Extremities: no edema or cyanosis. Warm, well perfused. 2+ radial and PT pulses bilaterally Skin: warm and dry, no rashes noted Neuro: alert and oriented, no focal deficits  PROCEDURE NOTE: [place] Patient given informed consent and signed copy in the chart. Left arm area prepped and draped in the usual sterile fashion. 3cc of lidocaine with epinephrine 1% used for local anesthesia. A small stab incision was made close to the nexplanon with scalpel. Hemostats were used to withdraw the  nexplanon. A small bandage was applied over a steri strip. No complications. Patient given follow up instructions should she experience redness, swelling at sight or fever in the next 24 hours. Patient was reminded this totally removes her nexplanon contraceptive devise (she can now potentially conceive).  Assessment & Plan:   Yeast vaginitis Patient continues to have thick white vaginal discharge with minimal vaginal itching and burning. Last wet prep showed some yeast but was not clinically significant and therefore not treated. - Prescribed Diflucan 150mg  x1  Encounter for contraceptive management Patient desires OBGYN for further feminine health management. - Referral to OBGYN  Encounter for Nexplanon removal Patient's Nexplanon implanted 03/26/2019 was removed 04/09/2019.   No follow-ups on file.   Dr. Milus Banister [facility], PGY-1

## 2019-04-09 NOTE — Patient Instructions (Signed)
Thank you for coming in to see Christy Barnes today! Please see below to review our plan for today's visit:  1. You have been referred to [facility] for further gyn health management. 2. Your left arm may feel swollen or sore. Keep the wrapping in place for 24 hours. I encourage you to continue to use your arm to help healing. 3. You have been prescribed Diflucan 150mg  x1 for yeast infection.  Please call the clinic at [phone] if your symptoms worsen or you have any concerns. It was our pleasure to serve you!   Dr. Milus Banister [facility]

## 2019-04-09 NOTE — Assessment & Plan Note (Signed)
Patient continues to have thick white vaginal discharge with minimal vaginal itching and burning. Last wet prep showed some yeast but was not clinically significant and therefore not treated. - Prescribed Diflucan 150mg  x1

## 2019-04-09 NOTE — Assessment & Plan Note (Signed)
Patient desires OBGYN for further feminine health management. - Referral to [facility]

## 2019-04-09 NOTE — Assessment & Plan Note (Signed)
Patient's Nexplanon implanted 03/26/2019 was removed 04/09/2019.

## 2019-05-28 ENCOUNTER — Ambulatory Visit: Payer: BC Managed Care – PPO | Admitting: Family Medicine

## 2019-05-28 ENCOUNTER — Ambulatory Visit (INDEPENDENT_AMBULATORY_CARE_PROVIDER_SITE_OTHER): Payer: BC Managed Care – PPO | Admitting: Family Medicine

## 2019-05-28 ENCOUNTER — Telehealth: Payer: Self-pay | Admitting: Internal Medicine

## 2019-05-28 ENCOUNTER — Other Ambulatory Visit: Payer: Self-pay

## 2019-05-28 DIAGNOSIS — M79602 Pain in left arm: Secondary | ICD-10-CM

## 2019-05-28 NOTE — Telephone Encounter (Signed)
Attempted to call patient about her appointment on 7/9 @ 8:55. 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 Patient instructed to have the [employer]. Patient instructed to give the office a call back if needing assistance with the app.
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
## 2019-05-28 NOTE — Patient Instructions (Signed)
   This is a picture of the splint you can get at the pharmacy.  You can also try topical treatment such as capsaicin or BenGay or Aspercreme.  It is possible the pain in your forearm is caused by carpal tunnel syndrome.  The best way to treat this is with reduced strain on that arm and using 1 of these splints.  The pain around the site of your Nexplanon removal should go and time.  If any of these pains start to get worse and worsen your strength or start to cause numbness that the reason to come back to our clinic to get reevaluated.  Have a great day,  Clemetine Marker, MD

## 2019-05-29 ENCOUNTER — Telehealth: Payer: BC Managed Care – PPO | Admitting: Internal Medicine

## 2019-05-29 DIAGNOSIS — Z309 Encounter for contraceptive management, unspecified: Secondary | ICD-10-CM

## 2019-05-29 NOTE — Progress Notes (Signed)
LM @ [phone] for pt that we are calling for her virtual appt scheduled at 0855.  I will call back in 15 minutes after her appt time in which if we do not reach her we will have to request that she reschedules her appt.    9:17a-no answer, left message to reschedule appointment.

## 2019-06-02 DIAGNOSIS — M79602 Pain in left arm: Secondary | ICD-10-CM | POA: Insufficient documentation

## 2019-06-02 NOTE — Assessment & Plan Note (Signed)
Patient has what appears to be 2 separate complaints of pain on her left arm.  First is upper arm at the site of the Nexplanon injection and removal.  This is localized soreness with no numbness or paresthesia.  This is likely due to the removal and should resolve in time.  Next pain is more of a carpal tunnel/wrist pain.  She says it is mild to moderate in intensity and constant.  She does not do a lot of repetitive movements that you would expect in somebody with carpal tunnel and Phalen's and Tinel test were negative.  Despite this description of her symptoms sound most consistent with carpal tunnel syndrome.  Gave patient information on how to pick up a wrist splint at the pharmacy, advised her to make follow-up appointment if her pain does not resolve or worsens.

## 2019-06-20 ENCOUNTER — Encounter: Payer: Self-pay | Admitting: Family Medicine

## 2019-06-24 ENCOUNTER — Other Ambulatory Visit: Payer: Self-pay

## 2019-06-24 ENCOUNTER — Ambulatory Visit (INDEPENDENT_AMBULATORY_CARE_PROVIDER_SITE_OTHER): Payer: BC Managed Care – PPO | Admitting: Family Medicine

## 2019-06-24 ENCOUNTER — Other Ambulatory Visit (HOSPITAL_COMMUNITY)
Admission: RE | Admit: 2019-06-24 | Discharge: 2019-06-24 | Disposition: A | Discharge status: Home / Self Care | Payer: BC Managed Care – PPO | Source: Ambulatory Visit | Attending: Family Medicine | Admitting: Family Medicine

## 2019-06-24 VITALS — BP 108/64 | HR 104

## 2019-06-24 DIAGNOSIS — R350 Frequency of micturition: Secondary | ICD-10-CM | POA: Diagnosis not present

## 2019-06-24 DIAGNOSIS — R399 Unspecified symptoms and signs involving the genitourinary system: Secondary | ICD-10-CM | POA: Diagnosis not present

## 2019-06-24 DIAGNOSIS — Z113 Encounter for screening for infections with a predominantly sexual mode of transmission: Secondary | ICD-10-CM | POA: Insufficient documentation

## 2019-06-24 DIAGNOSIS — Z3200 Encounter for pregnancy test, result unknown: Secondary | ICD-10-CM | POA: Diagnosis not present

## 2019-06-24 LAB — POCT URINALYSIS DIP (MANUAL ENTRY)
Bilirubin, UA: NEGATIVE
Blood, UA: NEGATIVE
Glucose, UA: NEGATIVE mg/dL
Ketones, POC UA: NEGATIVE mg/dL
Leukocytes, UA: NEGATIVE
Nitrite, UA: NEGATIVE
Protein Ur, POC: NEGATIVE mg/dL
Spec Grav, UA: 1.02 (ref 1.010–1.025)
Urobilinogen, UA: 0.2 E.U./dL
pH, UA: 6 (ref 5.0–8.0)

## 2019-06-24 LAB — POCT URINE PREGNANCY: Preg Test, Ur: NEGATIVE

## 2019-06-24 NOTE — Assessment & Plan Note (Addendum)
Patient pregnancy and urinalysis both negative.  Provided reassurance to patient that we have likely ruled out the most pressing matters.  Patient appreciated this.  She is amenable to watching and waiting to see if she has improvement in symptoms.  Also have the GC/C, RPR, and HIV test pending.  Will treat pending results.  Patient follow-up as needed.

## 2019-06-24 NOTE — Progress Notes (Signed)
    Subjective:  Christy Barnes is a 23 y.o. female who presents to the [facility] today with a chief complaint of urinary symptoms and missing her period.   HPI: Patient said that she missed her period in July.  Also has feeling that she cannot urinate completely.  Has increased urinary frequency.  Does endorse some nausea.  Has also had 2 weeks of vaginal discharge and odor which is now resolved.  Denies dysuria, hematuria, abdominal pain, vomiting.  Concerned she may have an STI.  Patient does not use any form of birth control.  Patient counseled on safe sex techniques and barrier protection.  Denies excessive thirst.  ROS: Per HPI  PMH: Smoking history reviewed.    Objective:  Physical Exam: BP 108/64   Pulse (!) 104   LMP 04/28/2019 (Approximate)   SpO2 97%   Gen: NAD, resting comfortably CV: RRR with no murmurs appreciated Pulm: NWOB, CTAB with no crackles, wheezes, or rhonchi GI: Normal bowel sounds present. Soft, Nontender, Nondistended. MSK: no edema, cyanosis, or clubbing noted Skin: warm, dry Neuro: grossly normal, moves all extremities Psych: Normal affect and thought content  Results for orders placed or performed in visit on 06/24/19 (from the past 72 hour(s))  POCT urinalysis dipstick     Status: None   Collection Time: 06/24/19  2:20 PM  Result Value Ref Range   Color, UA yellow yellow   Clarity, UA clear clear   Glucose, UA negative negative mg/dL   Bilirubin, UA negative negative   Ketones, POC UA negative negative mg/dL   Spec Grav, UA 1.020 1.010 - 1.025   Blood, UA negative negative   pH, UA 6.0 5.0 - 8.0   Protein Ur, POC negative negative mg/dL   Urobilinogen, UA 0.2 0.2 or 1.0 E.U./dL   Nitrite, UA Negative Negative   Leukocytes, UA Negative Negative  POCT urine pregnancy     Status: None   Collection Time: 06/24/19  2:20 PM  Result Value Ref Range   Preg Test, Ur Negative Negative     Assessment/Plan:  Urinary frequency Patient pregnancy and  urinalysis both negative.  Provided reassurance to patient that we have likely ruled out the most pressing matters.  Patient appreciated this.  She is amenable to watching and waiting to see if she has improvement in symptoms.  Also have the GC/C, RPR, and HIV test pending.  Will treat pending results.  Patient follow-up as needed.     Lab Orders     HIV antibody (with reflex)     RPR     POCT urinalysis dipstick     POCT urine pregnancy  No orders of the defined types were placed in this encounter.     Marny Lowenstein, MD, MS FAMILY MEDICINE RESIDENT - PGY3 06/24/2019 2:34 PM

## 2019-06-25 LAB — HIV ANTIBODY (ROUTINE TESTING W REFLEX): HIV Screen 4th Generation wRfx: NONREACTIVE

## 2019-06-25 LAB — RPR: RPR Ser Ql: NONREACTIVE

## 2019-06-30 ENCOUNTER — Encounter: Payer: Self-pay | Admitting: Family Medicine

## 2019-07-03 ENCOUNTER — Encounter: Payer: Self-pay | Admitting: Family Medicine

## 2019-07-03 DIAGNOSIS — R1030 Lower abdominal pain, unspecified: Secondary | ICD-10-CM

## 2019-07-03 LAB — URINE CYTOLOGY ANCILLARY ONLY
Chlamydia: NEGATIVE
Neisseria Gonorrhea: NEGATIVE

## 2019-07-15 ENCOUNTER — Telehealth (INDEPENDENT_AMBULATORY_CARE_PROVIDER_SITE_OTHER): Payer: BC Managed Care – PPO | Admitting: Advanced Practice Midwife

## 2019-07-15 ENCOUNTER — Other Ambulatory Visit: Payer: Self-pay

## 2019-07-15 DIAGNOSIS — R350 Frequency of micturition: Secondary | ICD-10-CM

## 2019-07-15 DIAGNOSIS — R102 Pelvic and perineal pain: Secondary | ICD-10-CM

## 2019-07-15 DIAGNOSIS — N9089 Other specified noninflammatory disorders of vulva and perineum: Secondary | ICD-10-CM | POA: Diagnosis not present

## 2019-07-15 MED ORDER — IBUPROFEN 200 MG PO TABS: 600.0000 mg | ORAL_TABLET | Freq: Four times a day (QID) | ORAL | Status: DC | PRN

## 2019-07-15 NOTE — Progress Notes (Signed)
I connected with  EZRA EKE on 07/15/19 at  2:15 PM EDT by telephone and verified that I am speaking with the correct person using two identifiers.   I discussed the limitations, risks, security and privacy concerns of performing an evaluation and management service by telephone and the availability of in person appointments. I also discussed with the patient that there may be a patient responsible charge related to this service. The patient expressed understanding and agreed to proceed.  Alric Seton, [place] 07/15/2019  2:31 PM   July no period, had a lot of pelvic problems with pressure not a UTI and still getting worse. States she had a urgency to [facility] and has had lower back pain. Started 3 weeks ago, stopped now, still feeling pressure on uterus.

## 2019-07-15 NOTE — Progress Notes (Signed)
   Subjective:    Patient ID: Christy Barnes, female    DOB: 09/10/1996, 23 y.o.   MRN: [id number]   Gas VIRTUAL VIDEO VISIT ENCOUNTER NOTE  Provider location: [facility] [place] [facility]   I connected with [name] on 07/15/19 at  2:15 PM EDT by MyChart Video Encounter at home. I was unable to hear her despite trouble shooting so visit was converted to phone visit. I verified that I am speaking with the correct person using two identifiers.   I discussed the limitations, risks, security and privacy concerns of performing an evaluation and management service virtually and the availability of in person appointments. I also discussed with the patient that there may be a patient responsible charge related to this service. The patient expressed understanding and agreed to proceed.   HPI: Pt reports: 1. pelvic pressure x 2 months 2. missing period in July, but in normal period in August.  3. Frequency of urination and feeling as if she hs to urinate soon after emptying her bladder.   Neg UPT, UA, GC/Chlmaydia, RPR, HIV on 06/24/19. Sexually active. No BC. + Dypareunia last week.     Review of Systems  Constitutional: Negative for appetite change, chills and fever.  Gastrointestinal: Negative for abdominal pain, constipation, diarrhea, nausea and vomiting.  Endocrine: Negative for polyuria.  Genitourinary: Positive for dyspareunia, frequency, genital sores, menstrual problem, pelvic pain and urgency. Negative for difficulty urinating, dysuria, flank pain, hematuria, vaginal bleeding, vaginal discharge and vaginal pain.  Musculoskeletal: Negative for back pain.       Objective:   Physical Exam Not performed due to Virtual Visit.      Assessment & Plan:  1. Acute pelvic pain, female  - Needs in-person visit for Urine culture and Aptima swab and eval of vulvar lesion.   - ibuprofen (ADVIL) 200 MG tablet; Take 3 tablets (600 mg total) by mouth  every 6 (six) hours as needed for moderate pain. - US PELVIS (TRANSABDOMINAL ONLY); Future - US PELVIS TRANSVAGINAL NON-OB (TV ONLY); Future  2. Frequent urination  - Urine Culture; Future  3. Vulvar lesion - F/U w/ in-person visit  I provided 10 minutes of telephone time during this encounter.  Tamala Julian, [place], [place] 07/15/2019 3:34 PM

## 2019-07-15 NOTE — Patient Instructions (Signed)
Pelvic Pain, Female Pelvic pain is pain in your lower abdomen, below your belly button and between your hips. The pain may start suddenly (be acute), keep coming back (be recurring), or last a long time (become chronic). Pelvic pain that lasts longer than 6 months is considered chronic. Pelvic pain may affect your:  Reproductive organs.  Urinary system.  Digestive tract.  Musculoskeletal system. There are many potential causes of pelvic pain. Sometimes, the pain can be a result of digestive or urinary conditions, strained muscles or ligaments, or reproductive conditions. Sometimes the cause of pelvic pain is not known. Follow these instructions at home:   Take over-the-counter and prescription medicines only as told by your health care provider.  Rest as told by your health care provider.  Do not have sex if it hurts.  Keep a journal of your pelvic pain. Write down: ? When the pain started. ? Where the pain is located. ? What seems to make the pain better or worse, such as food or your period (menstrual cycle). ? Any symptoms you have along with the pain.  Keep all follow-up visits as told by your health care provider. This is important. Contact a health care provider if:  Medicine does not help your pain.  Your pain comes back.  You have new symptoms.  You have abnormal vaginal discharge or bleeding, including bleeding after menopause.  You have a fever or chills.  You are constipated.  You have blood in your urine or stool.  You have foul-smelling urine.  You feel weak or light-headed. Get help right away if:  You have sudden severe pain.  Your pain gets steadily worse.  You have severe pain along with fever, nausea, vomiting, or excessive sweating.  You lose consciousness. Summary  Pelvic pain is pain in your lower abdomen, below your belly button and between your hips.  There are many potential causes of pelvic pain.  Keep a journal of your pelvic  pain. This information is not intended to replace advice given to you by your health care provider. Make sure you discuss any questions you have with your health care provider. Document Released: 10/03/2004 Document Revised: 04/24/2018 Document Reviewed: 04/24/2018 Elsevier Patient Education  2020 Elsevier Inc.  

## 2019-07-21 DIAGNOSIS — F330 Major depressive disorder, recurrent, mild: Secondary | ICD-10-CM | POA: Diagnosis not present

## 2019-07-22 ENCOUNTER — Other Ambulatory Visit (HOSPITAL_COMMUNITY)
Admission: RE | Admit: 2019-07-22 | Discharge: 2019-07-22 | Disposition: A | Discharge status: Home / Self Care | Payer: BC Managed Care – PPO | Source: Ambulatory Visit | Attending: Women's Health | Admitting: Women's Health

## 2019-07-22 ENCOUNTER — Ambulatory Visit (INDEPENDENT_AMBULATORY_CARE_PROVIDER_SITE_OTHER): Payer: BC Managed Care – PPO | Admitting: Women's Health

## 2019-07-22 ENCOUNTER — Encounter: Payer: Self-pay | Admitting: Women's Health

## 2019-07-22 ENCOUNTER — Other Ambulatory Visit: Payer: Self-pay

## 2019-07-22 VITALS — BP 118/81 | HR 105 | Wt 223.5 lb

## 2019-07-22 DIAGNOSIS — Z113 Encounter for screening for infections with a predominantly sexual mode of transmission: Secondary | ICD-10-CM | POA: Insufficient documentation

## 2019-07-22 DIAGNOSIS — R102 Pelvic and perineal pain unspecified side: Secondary | ICD-10-CM | POA: Insufficient documentation

## 2019-07-22 NOTE — Patient Instructions (Signed)
Pelvic Pain, Female Pelvic pain is pain in your lower abdomen, below your belly button and between your hips. The pain may start suddenly (be acute), keep coming back (be recurring), or last a long time (become chronic). Pelvic pain that lasts longer than 6 months is considered chronic. Pelvic pain may affect your:  Reproductive organs.  Urinary system.  Digestive tract.  Musculoskeletal system. There are many potential causes of pelvic pain. Sometimes, the pain can be a result of digestive or urinary conditions, strained muscles or ligaments, or reproductive conditions. Sometimes the cause of pelvic pain is not known. Follow these instructions at home:   Take over-the-counter and prescription medicines only as told by your health care provider.  Rest as told by your health care provider.  Do not have sex if it hurts.  Keep a journal of your pelvic pain. Write down: ? When the pain started. ? Where the pain is located. ? What seems to make the pain better or worse, such as food or your period (menstrual cycle). ? Any symptoms you have along with the pain.  Keep all follow-up visits as told by your health care provider. This is important. Contact a health care provider if:  Medicine does not help your pain.  Your pain comes back.  You have new symptoms.  You have abnormal vaginal discharge or bleeding, including bleeding after menopause.  You have a fever or chills.  You are constipated.  You have blood in your urine or stool.  You have foul-smelling urine.  You feel weak or light-headed. Get help right away if:  You have sudden severe pain.  Your pain gets steadily worse.  You have severe pain along with fever, nausea, vomiting, or excessive sweating.  You lose consciousness. Summary  Pelvic pain is pain in your lower abdomen, below your belly button and between your hips.  There are many potential causes of pelvic pain.  Keep a journal of your pelvic  pain. This information is not intended to replace advice given to you by your health care provider. Make sure you discuss any questions you have with your health care provider. Document Released: 10/03/2004 Document Revised: 04/24/2018 Document Reviewed: 04/24/2018 Elsevier Patient Education  2020 Elsevier Inc.  

## 2019-07-22 NOTE — Progress Notes (Signed)
History:  Ms. Christy Barnes is a 23 y.o. No obstetric history on file. who presents to clinic today with c/o pelvic pressure x32month and groin pain that has been on and off for years. Christy Barnes recently had a telehealth visit and was told to present to clinic for an in-person examination. [place] was ordered at that time.  LMP 07/07/2019. Christy Barnes reports unprotected intercourse since that time, but reports she is not trying to get pregnant. Christy Barnes declines contraception at this time.  The following portions of the patient's history were reviewed and updated as appropriate: allergies, current medications, family history, past medical history, social history, past surgical history and problem list.  Review of Systems:  Review of Systems  Constitutional: Negative for chills, fever and weight loss.  Respiratory: Negative for shortness of breath.   Cardiovascular: Negative for chest pain.  Gastrointestinal: Negative for abdominal pain, nausea and vomiting.  Genitourinary: Negative for dysuria, frequency and urgency.       Pelvic pressure, groin pain  Neurological: Negative for dizziness and headaches.     Objective:  Physical Exam BP 118/81   Pulse (!) 105   Wt 223 lb 8 oz (101.4 kg)   BMI 38.36 kg/m  Physical Exam  Constitutional: She is oriented to person, place, and time. She appears well-developed and well-nourished. No distress.  HENT:  Head: Normocephalic and atraumatic.  Respiratory: Effort normal.  GI: Soft. She exhibits no distension and no mass. There is no abdominal tenderness. There is no rebound and no guarding. Hernia confirmed negative in the right inguinal area and confirmed negative in the left inguinal area.  Genitourinary: There is no rash, tenderness or lesion on the right labia. There is no rash, tenderness or lesion on the left labia. Uterus is not enlarged and not tender. Cervix exhibits no motion tenderness, no discharge and no friability. Right adnexum displays no mass, no  tenderness and no fullness. Left adnexum displays no mass, no tenderness and no fullness.    No vaginal discharge, tenderness or bleeding.  No tenderness or bleeding in the vagina.  Lymphadenopathy:       Right: No inguinal adenopathy present.       Left: No inguinal adenopathy present.  Neurological: She is alert and oriented to person, place, and time.  Skin: Skin is warm and dry. She is not diaphoretic.  Psychiatric: She has a normal mood and affect. Her behavior is normal. Judgment and thought content normal.   Labs and Imaging  UPT negative today, trace blood on UA.  Assessment & Plan:   1. Pelvic pressure in female - discussed when to seek emergency care - Christy Barnes to have previously ordered US scheduled today - POCT Urinalysis Dipstick - Urine Culture - Cervicovaginal ancillary only( [place]) - POCT urine pregnancy - Ambulatory referral to Physical Therapy for pelvic Christy Barnes consultation - discussed at length with patient that if she is having periods and is sexually active without protection she may become pregnant and was advised to start PNVs, Christy Barnes to RTC if desiring contraception - Christy Barnes advised that if all US/labs/evaluations/consultations are normal to consult primary care regarding her issue for additional options  2. Screening for STDs (sexually transmitted diseases) - Christy Barnes declines RPR/HIV screening as just had them performed 06/24/2019 - negative - Hepatitis B surface antibody,quantitative - Hepatitis C Antibody   Nahmir Zeidman, Gerrie Nordmann, NP 07/24/2019 10:08 AM

## 2019-07-23 LAB — POCT URINALYSIS DIP (DEVICE)
Bilirubin Urine: NEGATIVE
Bilirubin Urine: NEGATIVE
Glucose, UA: NEGATIVE mg/dL
Glucose, UA: NEGATIVE mg/dL
Hgb urine dipstick: NEGATIVE
Ketones, ur: NEGATIVE mg/dL
Ketones, ur: NEGATIVE mg/dL
Leukocytes,Ua: NEGATIVE
Leukocytes,Ua: NEGATIVE
Nitrite: NEGATIVE
Nitrite: NEGATIVE
Protein, ur: NEGATIVE mg/dL
Protein, ur: NEGATIVE mg/dL
Specific Gravity, Urine: 1.025 (ref 1.005–1.030)
Specific Gravity, Urine: 1.02 (ref 1.005–1.030)
Urobilinogen, UA: 0.2 mg/dL (ref 0.0–1.0)
Urobilinogen, UA: 0.2 mg/dL (ref 0.0–1.0)
pH: 6.5 (ref 5.0–8.0)
pH: 6.5 (ref 5.0–8.0)

## 2019-07-23 LAB — HEPATITIS C ANTIBODY: Hep C Virus Ab: <0.1 s/co ratio (ref 0.0–0.9)

## 2019-07-23 LAB — URINE CULTURE

## 2019-07-23 LAB — HEPATITIS B SURFACE ANTIBODY, QUANTITATIVE: Hepatitis B Surf Ab Quant: 3.3 m[IU]/mL — ABNORMAL LOW (ref >9.9)

## 2019-07-23 LAB — POCT PREGNANCY, URINE: Preg Test, Ur: NEGATIVE

## 2019-07-24 LAB — CERVICOVAGINAL ANCILLARY ONLY
Bacterial vaginitis: POSITIVE — AB
Candida vaginitis: POSITIVE — AB
Chlamydia: NEGATIVE
Neisseria Gonorrhea: NEGATIVE
Trichomonas: NEGATIVE

## 2019-07-25 ENCOUNTER — Other Ambulatory Visit: Payer: Self-pay | Admitting: Women's Health

## 2019-07-25 ENCOUNTER — Telehealth: Payer: Self-pay | Admitting: *Deleted

## 2019-07-25 DIAGNOSIS — B3731 Acute candidiasis of vulva and vagina: Secondary | ICD-10-CM

## 2019-07-25 DIAGNOSIS — B9689 Other specified bacterial agents as the cause of diseases classified elsewhere: Secondary | ICD-10-CM

## 2019-07-25 DIAGNOSIS — B373 Candidiasis of vulva and vagina: Secondary | ICD-10-CM

## 2019-07-25 DIAGNOSIS — Z113 Encounter for screening for infections with a predominantly sexual mode of transmission: Secondary | ICD-10-CM

## 2019-07-25 MED ORDER — FLUCONAZOLE 150 MG PO TABS: 150.0000 mg | ORAL_TABLET | Freq: Every day | ORAL | 0 refills | Status: AC

## 2019-07-25 MED ORDER — METRONIDAZOLE 500 MG PO TABS: 500.0000 mg | ORAL_TABLET | Freq: Two times a day (BID) | ORAL | 0 refills | Status: AC

## 2019-07-25 NOTE — Telephone Encounter (Signed)
I called Enslie and left a message I am calling with information from your provider-please call our office back.  Synai Prettyman,RN

## 2019-07-25 NOTE — Telephone Encounter (Signed)
Tahari called back and I informed her of results and reccommendations per Delane Ginger Nugent, NP. I reviewed her allergies, medications and pharmacy. She was scheduled for lab for Tuesday 07/29/19 at 10am. I explained Nugent, NP will send in Rx for BV and yeast to her pharmacy. She request pills for yeast , not cream.  I explained if lab shows no Hepatiis B infection Nugent, NP will discuss getting vaccination for Hep B. She voices understanding. Sherryl Valido,RN

## 2019-07-25 NOTE — Telephone Encounter (Signed)
Nugent, Gerrie Nordmann, NP  [facility]        Hello,   Please call this patient to relay the following information about test results:   1. UPT negative  2. Pt is not immune to HepB. Pt needs to come in for testing for Hepatitis B Surface Antigen to check for infection. If no current infection, pt can discuss HepB immunization to gain immunity.  3. Hepatitis C testing negative  4. No evidence of UTI on labs  5. Pt does have BV and yeast. Please confirm with her that she has no allergies, please update med list and PMH and I will send treatment to the pharmacy for her.   Thank you,  Elmyra Ricks

## 2019-07-25 NOTE — Progress Notes (Signed)
Medication sent for BV and yeast. Pt requests pill for yeast treatment.  Clarisa Fling, NP  10:57 AM 07/25/2019

## 2019-07-25 NOTE — Telephone Encounter (Signed)
Medications have been sent. Furture order entered for HepB testing. Thank you, Elmyra Ricks

## 2019-07-29 ENCOUNTER — Other Ambulatory Visit: Payer: BC Managed Care – PPO

## 2019-07-30 ENCOUNTER — Telehealth: Payer: Self-pay | Admitting: Obstetrics & Gynecology

## 2019-07-30 NOTE — Telephone Encounter (Signed)
Called the patient to confirm the upcoming appointment. Left a detailed voicemail of wearing a face mask, no children or visitors due to covid19 restrictions. Also instructed if the patient has been in contact with someone who has had covid19, if she has been diagnosed with [id number], or experienced any flu-like symptoms we ask that you please call our office to reschedule.

## 2019-07-31 ENCOUNTER — Other Ambulatory Visit: Payer: Self-pay | Admitting: Advanced Practice Midwife

## 2019-07-31 ENCOUNTER — Other Ambulatory Visit: Payer: BC Managed Care – PPO

## 2019-07-31 ENCOUNTER — Ambulatory Visit (HOSPITAL_COMMUNITY)
Admission: RE | Admit: 2019-07-31 | Discharge: 2019-07-31 | Disposition: A | Discharge status: Home / Self Care | Payer: BC Managed Care – PPO | Source: Ambulatory Visit | Attending: Advanced Practice Midwife | Admitting: Advanced Practice Midwife

## 2019-07-31 ENCOUNTER — Other Ambulatory Visit: Payer: Self-pay

## 2019-07-31 DIAGNOSIS — R102 Pelvic and perineal pain: Secondary | ICD-10-CM

## 2019-07-31 DIAGNOSIS — D259 Leiomyoma of uterus, unspecified: Secondary | ICD-10-CM | POA: Diagnosis not present

## 2019-07-31 DIAGNOSIS — Z113 Encounter for screening for infections with a predominantly sexual mode of transmission: Secondary | ICD-10-CM

## 2019-08-01 LAB — HEPATITIS B SURFACE ANTIGEN: Hepatitis B Surface Ag: NEGATIVE

## 2019-08-04 ENCOUNTER — Telehealth: Payer: Self-pay

## 2019-08-04 NOTE — Telephone Encounter (Addendum)
-----   Message from Clarisa Fling, NP sent at 08/03/2019  2:11 PM EDT ----- Regarding: Hepatitis B Results Please call pt to let her know her Hepatitis B testing is negative. If she would like to get the vaccine for Hepatitis B as she is non-immune, she can schedule an appointment at the clinic.  Thank you, Elmyra Ricks ----- Message ----- From: Interface, [facility] Lab Results In Sent: 08/01/2019   4:36 AM EDT To: Clarisa Fling, NP   LM on VM to please call back with results.

## 2019-08-11 NOTE — Telephone Encounter (Signed)
Notified pt of negative Hep B results and that she is needing to receive three shot series of Hep B vaccines.  Pt states that she would like to come her for the vaccine.  I informed pt that we will call her once we receive the vaccine.  Pt verbalized understanding.

## 2019-08-25 ENCOUNTER — Encounter: Payer: Self-pay | Admitting: General Practice

## 2019-08-31 ENCOUNTER — Encounter: Payer: Self-pay | Admitting: Family Medicine

## 2019-09-04 ENCOUNTER — Ambulatory Visit: Payer: BC Managed Care – PPO

## 2019-09-04 ENCOUNTER — Ambulatory Visit: Payer: BC Managed Care – PPO | Admitting: Family Medicine

## 2019-09-08 ENCOUNTER — Ambulatory Visit: Payer: BC Managed Care – PPO

## 2019-09-08 NOTE — Telephone Encounter (Signed)
LM for pt that I apologized for the delay and that we have series of vaccines here.  If she could please call the office to schedule for her first Hep B vaccine.

## 2019-09-09 NOTE — Telephone Encounter (Signed)
LM that I am calling to schedule her vaccine series if she could please give the office a call back to schedule.

## 2019-09-15 NOTE — Telephone Encounter (Signed)
Called pt and someone picked up the phone then hung up.  Called pt back and went to VM.  LM stating that I am calling in regards to her scheduling her appt for vaccine series if she can call back to schedule appt.   Mel Almond, RN

## 2019-09-17 ENCOUNTER — Encounter: Payer: Self-pay | Admitting: Family Medicine

## 2019-09-17 ENCOUNTER — Other Ambulatory Visit: Payer: Self-pay

## 2019-09-17 ENCOUNTER — Ambulatory Visit (INDEPENDENT_AMBULATORY_CARE_PROVIDER_SITE_OTHER): Payer: BC Managed Care – PPO | Admitting: Family Medicine

## 2019-09-17 VITALS — BP 98/64 | HR 62

## 2019-09-17 DIAGNOSIS — H938X3 Other specified disorders of ear, bilateral: Secondary | ICD-10-CM | POA: Diagnosis not present

## 2019-09-17 DIAGNOSIS — R350 Frequency of micturition: Secondary | ICD-10-CM

## 2019-09-17 DIAGNOSIS — R1032 Left lower quadrant pain: Secondary | ICD-10-CM | POA: Diagnosis not present

## 2019-09-17 DIAGNOSIS — G8929 Other chronic pain: Secondary | ICD-10-CM

## 2019-09-17 DIAGNOSIS — Z319 Encounter for procreative management, unspecified: Secondary | ICD-10-CM | POA: Diagnosis not present

## 2019-09-17 DIAGNOSIS — R109 Unspecified abdominal pain: Secondary | ICD-10-CM

## 2019-09-17 DIAGNOSIS — H938X9 Other specified disorders of ear, unspecified ear: Secondary | ICD-10-CM | POA: Insufficient documentation

## 2019-09-17 MED ORDER — FLUTICASONE PROPIONATE 50 MCG/ACT NA SUSP: 2.0000 | Freq: Every day | NASAL | 6 refills | Status: DC

## 2019-09-17 MED ORDER — PRENATAL VITAMIN AND MINERAL 28-0.8 MG PO TABS: 1.0000 | ORAL_TABLET | Freq: Every day | ORAL | 11 refills | Status: DC

## 2019-09-17 MED ORDER — CETIRIZINE HCL 10 MG PO TABS: 10.0000 mg | ORAL_TABLET | Freq: Every day | ORAL | 11 refills | Status: AC

## 2019-09-17 NOTE — Progress Notes (Signed)
   Subjective:    Patient ID: Christy Barnes, female    DOB: 02-Jun-1996, 23 y.o.   MRN: [id number]   CC: groin pain and ear fullness  HPI: Groin pain Presenting for chronic groin pain.  States it is on her left side.Marland Kitchen  Has been painful since 2019.  States that started last year.  Patient's boyfriend also palpated a knot in the area.  Can be on and off.  Worse when she stands.  Is having normal bowel movements.  Noted she has increased urinary frequency.  Only drinks water and graham crackers.  Does do daily exercise with walking and boxing.  No heavy lifting.  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  Ear fullness Reports fullness in her ears.  This has been happening for months.  Has not tried anything so far.  Has seasonal allergies.  Denies any sore throat.  Denies any fevers.  Denies any headache.  Objective:  BP 98/64   Pulse 62   SpO2 98%  Vitals and nursing note reviewed  General: well nourished, in no acute distress HEENT: normocephalic, TM's visualized bilaterally, no scleral icterus or conjunctival pallor, no nasal discharge, moist mucous membranes, good dentition without erythema or discharge noted in posterior oropharynx Neck: supple, non-tender, without lymphadenopathy Cardiac: RRR, clear S1 and S2, no murmurs, rubs, or gallops Respiratory: clear to auscultation bilaterally, no increased work of breathing Abdomen: soft, nontender, nondistended, no masses or organomegaly. Bowel sounds present Extremities: no edema or cyanosis. Warm, well perfused.  Small nodule noted in her left inguinal fold.  Tenderness to palpation.  Only noted when she is standing. Skin: warm and dry, no rashes noted Neuro: alert and oriented, no focal deficits   Assessment & Plan:    Groin pain, chronic, left Unclear etiology at this time.  Can consider hernia as areas only palpated when  standing.  I advised follow-up with general surgery however patient states she would not like surgery at this time and would like to discuss further with her boyfriend first.  Will call with decision.  Advised conservative measures such as heating pads and stretches.  Strict precautions given.  Did have recent abdominal as well as pelvic ultrasound which were negative.  Follow-up if no improvement.  Ear fullness Likely secondary to fluid from her seasonal allergy.  I advised using Flonase as well as Zyrtec.  Strict return precautions given.  Desire for pregnancy Patient did mention to me that she is not on any birth control.  States that she is not actively trying to have a child but states that if she were to get pregnant it would not be unwanted.  I advised her to start taking a prenatal vitamin as brain development starts very early in development.  Patient would like a prescription for this.  Rx for prenatal vitamin sent.    Return if symptoms worsen or fail to improve.   Caroline More, DO, PGY-3

## 2019-09-17 NOTE — Assessment & Plan Note (Addendum)
Unclear etiology at this time.  Can consider hernia as areas only palpated when standing.  I advised follow-up with general surgery however patient states she would not like surgery at this time and would like to discuss further with her boyfriend first.  Will call with decision.  Advised conservative measures such as heating pads and stretches.  Strict precautions given.  Did have recent abdominal as well as pelvic ultrasound which were negative.  Follow-up if no improvement.

## 2019-09-17 NOTE — Assessment & Plan Note (Signed)
Likely secondary to fluid from her seasonal allergy.  I advised using Flonase as well as Zyrtec.  Strict return precautions given.

## 2019-09-17 NOTE — Patient Instructions (Addendum)
For your groin pain: If you think the pain is bad enough that you would like further evaluation I am happy to give you a referral to general surgery.  They can could order imaging as needed.  For your ear clogging: I will prescribe Flonase and Zyrtec.  Please make sure to take a prenatal vitamin if you are considering to get pregnant.  Avoid alcohol, drugs, tobacco use.  Dr. Tammi Klippel

## 2019-09-17 NOTE — Assessment & Plan Note (Signed)
Patient did mention to me that she is not on any birth control.  States that she is not actively trying to have a child but states that if she were to get pregnant it would not be unwanted.  I advised her to start taking a prenatal vitamin as brain development starts very early in development.  Patient would like a prescription for this.  Rx for prenatal vitamin sent.

## 2019-09-22 NOTE — Telephone Encounter (Signed)
Called pt and pt informed me that she thinks that she received the vaccine and asked if it was in our records.  I verified with pt that she had informed me that she was unsure that she received the vaccine.  Pt stated that she will call her PCP and find out if she has had the vaccine series.

## 2019-09-24 NOTE — Telephone Encounter (Signed)
Called pt and LM that I am calling in regards to her vaccines and speaking with her PCP if she could please give the office a call back.  If I don't hear back from you within the next day or two I will take it that you have what you need and I will return vaccines.

## 2019-09-29 DIAGNOSIS — F330 Major depressive disorder, recurrent, mild: Secondary | ICD-10-CM | POA: Diagnosis not present

## 2019-09-30 NOTE — Telephone Encounter (Signed)
Due to pt not calling the office indicating that she was able to get the information she needed in regards to her receiving the Hep B vaccine.  Hep B vaccines shipped back to [facility] inpatient pharmacy.

## 2019-10-24 ENCOUNTER — Other Ambulatory Visit: Payer: Self-pay

## 2019-10-24 DIAGNOSIS — Z20822 Contact with and (suspected) exposure to covid-19: Secondary | ICD-10-CM

## 2019-10-25 LAB — NOVEL CORONAVIRUS, NAA: SARS-CoV-2, NAA: NOT DETECTED

## 2020-01-12 ENCOUNTER — Other Ambulatory Visit: Payer: Self-pay

## 2020-01-12 ENCOUNTER — Other Ambulatory Visit (HOSPITAL_COMMUNITY)
Admission: RE | Admit: 2020-01-12 | Discharge: 2020-01-12 | Disposition: A | Discharge status: Home / Self Care | Payer: BC Managed Care – PPO | Source: Ambulatory Visit | Attending: Family Medicine | Admitting: Family Medicine

## 2020-01-12 ENCOUNTER — Encounter: Payer: Self-pay | Admitting: Family Medicine

## 2020-01-12 ENCOUNTER — Ambulatory Visit (INDEPENDENT_AMBULATORY_CARE_PROVIDER_SITE_OTHER): Payer: BC Managed Care – PPO | Admitting: Family Medicine

## 2020-01-12 ENCOUNTER — Telehealth: Payer: Self-pay | Admitting: *Deleted

## 2020-01-12 VITALS — BP 102/60 | HR 83 | Ht 64.0 in | Wt 233.2 lb

## 2020-01-12 DIAGNOSIS — R350 Frequency of micturition: Secondary | ICD-10-CM | POA: Insufficient documentation

## 2020-01-12 DIAGNOSIS — N979 Female infertility, unspecified: Secondary | ICD-10-CM

## 2020-01-12 DIAGNOSIS — Z113 Encounter for screening for infections with a predominantly sexual mode of transmission: Secondary | ICD-10-CM | POA: Insufficient documentation

## 2020-01-12 DIAGNOSIS — N898 Other specified noninflammatory disorders of vagina: Secondary | ICD-10-CM

## 2020-01-12 LAB — POCT WET PREP (WET MOUNT)
Clue Cells Wet Prep Whiff POC: NEGATIVE
Trichomonas Wet Prep HPF POC: ABSENT

## 2020-01-12 LAB — POCT URINALYSIS DIP (MANUAL ENTRY)
Bilirubin, UA: NEGATIVE
Blood, UA: NEGATIVE
Glucose, UA: NEGATIVE mg/dL
Ketones, POC UA: NEGATIVE mg/dL
Leukocytes, UA: NEGATIVE
Nitrite, UA: NEGATIVE
Protein Ur, POC: NEGATIVE mg/dL
Spec Grav, UA: 1.025 (ref 1.010–1.025)
Urobilinogen, UA: 1 E.U./dL
pH, UA: 6 (ref 5.0–8.0)

## 2020-01-12 NOTE — Progress Notes (Signed)
    SUBJECTIVE:   CHIEF COMPLAINT / HPI:   VAGINAL DISCHARGE  Having vaginal discharge for 7 days. Discharge consistency: thin Discharge color: white Medications tried: none  Recent antibiotic use: none Sex in last month: yes Possible STD exposure:unknown  Symptoms Fever: no Dysuria:no Urinary Frequency and incontinence: Yes, chronic, worse in past week Patient's last menstrual period was 12/16/2019. Vaginal bleeding: no Abdomen or Pelvic pain: n Back pain: n Genital sores or ulcers:n Rash: n Pain during sex: n Missed menstrual period: n  ROS see HPI Smoking Status noted    PERTINENT  PMH / PSH:   As above  OBJECTIVE:   BP 102/60   Pulse 83   Ht 5\' 4"  (1.626 m)   Wt 233 lb 4 oz (105.8 kg)   LMP 12/16/2019   SpO2 97%   BMI 40.04 kg/m   General appearance: alert, cooperative and appears stated age Abdomen: soft, non-tender; bowel sounds normal; no masses,  no organomegaly Pelvic: cervix normal in appearance, external genitalia normal, no adnexal masses or tenderness, no cervical motion tenderness, rectovaginal septum normal, uterus normal size, shape, and consistency and white vaginal discharge  Examination chaperoned by Andreas Newport   ASSESSMENT/PLAN:   Frequent urination Patient with history of chronic urinary frequency with vaginal discharge charge and incontinence.  UA negative for infection.  Wet prep negative for infection.  GC chlamydia, RPR, HIV pending. Longstanding issue possibly overactive bladder, no known bladder vaginal trauma to think stress incontinence.  Will rule out infectious causes as above.  Regular periods, unlikely pregnancy at this time.  Not wanting birth control, but does have a history of infertility. -Pelvic exercises including Kegel given today and handout -Referral for pelvic physical therapy  -Infectious work-up as above -Referral to OB/GYN -Consider trial of Myrbetriq if no improvement     Bonnita Hollow, MD [place]

## 2020-01-12 NOTE — Telephone Encounter (Signed)
-----   Message from Bonnita Hollow, MD sent at 01/12/2020 11:43 AM EST ----- Patient wet prep (yeast, BV, trich) are UA are negative. Please call patient to inform.

## 2020-01-12 NOTE — Patient Instructions (Signed)

## 2020-01-12 NOTE — Assessment & Plan Note (Signed)
Patient with history of chronic urinary frequency with vaginal discharge charge and incontinence.  UA negative for infection.  Wet prep negative for infection.  GC chlamydia, RPR, HIV pending. Longstanding issue possibly overactive bladder, no known bladder vaginal trauma to think stress incontinence.  Will rule out infectious causes as above.  Regular periods, unlikely pregnancy at this time.  Not wanting birth control, but does have a history of infertility. -Pelvic exercises including Kegel given today and handout -Referral for pelvic physical therapy  -Infectious work-up as above -Referral to OB/GYN -Consider trial of Myrbetriq if no improvement

## 2020-01-12 NOTE — Telephone Encounter (Signed)
LM for patient on identified voicemail that labs performed in house were negative and that we would call her back when the other return.  Edmundo Tedesco,CMA

## 2020-01-13 LAB — RPR: RPR Ser Ql: NONREACTIVE

## 2020-01-13 LAB — HIV ANTIBODY (ROUTINE TESTING W REFLEX): HIV Screen 4th Generation wRfx: NONREACTIVE

## 2020-01-14 LAB — CERVICOVAGINAL ANCILLARY ONLY
Chlamydia: NEGATIVE
Comment: NEGATIVE
Comment: NORMAL
Neisseria Gonorrhea: NEGATIVE

## 2020-02-02 ENCOUNTER — Ambulatory Visit: Payer: BC Managed Care – PPO | Admitting: Physical Therapy

## 2020-02-17 ENCOUNTER — Other Ambulatory Visit: Payer: Self-pay

## 2020-02-17 ENCOUNTER — Ambulatory Visit (INDEPENDENT_AMBULATORY_CARE_PROVIDER_SITE_OTHER): Payer: BC Managed Care – PPO | Admitting: Family Medicine

## 2020-02-17 VITALS — BP 112/72 | HR 77 | Ht 64.0 in | Wt 235.0 lb

## 2020-02-17 DIAGNOSIS — N926 Irregular menstruation, unspecified: Secondary | ICD-10-CM | POA: Diagnosis not present

## 2020-02-17 DIAGNOSIS — N939 Abnormal uterine and vaginal bleeding, unspecified: Secondary | ICD-10-CM | POA: Diagnosis not present

## 2020-02-17 DIAGNOSIS — R040 Epistaxis: Secondary | ICD-10-CM | POA: Diagnosis not present

## 2020-02-17 LAB — POCT URINE PREGNANCY: Preg Test, Ur: NEGATIVE

## 2020-02-17 MED ORDER — FLUTICASONE PROPIONATE 50 MCG/ACT NA SUSP: 2.0000 | Freq: Every day | NASAL | 6 refills | Status: DC

## 2020-02-17 MED ORDER — OXYMETAZOLINE HCL 0.05 % NA SOLN: 1.0000 | Freq: Two times a day (BID) | NASAL | 0 refills | Status: DC

## 2020-02-17 NOTE — Progress Notes (Signed)
    SUBJECTIVE:   CHIEF COMPLAINT / HPI:   Nose Bleeds Patient has history of nose bleeds to respond to nasal pinching and leaning forward. Last about 30 seconds most of the time but one did last 2 minutes and then resolve. For the past 3 weeks they appear to be occurring more often. She does not feel stressed. She does have a history of allergies and has not been taking any allergy medication recently.   Abnormal Vaginal Bleeding Was on birth control about one year ago and stopped. Was thinking she had a miscarriage as she missed her period and then started having bleeding and spotting with crampy pain. Still having mild pain. No discharge. Her last menstrual period was on 2/24, lasted 3-5 days, and was normal in timing and flow. She then did not have a period on 3/22 but did have irregular bleeding and spotting with crampy abdominal pain that has now resolved. No fever, chills, some mild nausea, but no vomiting. She has not had these symptoms before was was concerned about a potential miscarriage. She did not obtain a pregnancy test before today. Over a year ago she stopped her OCP.   PERTINENT  PMH / PSH: Seasonal Allergies  OBJECTIVE:   BP 112/72   Pulse 77   Ht 5\' 4"  (1.626 m)   Wt 235 lb (106.6 kg)   SpO2 99%   BMI 40.34 kg/m   Gen: Alert and Oriented x 3, NAD HEENT: Normocephalic, atraumatic, non-swollen, non-erythematous turbinates, non-erythematous pharyngeal mucosa, no exudates Neck:  no LAD CV: RRR, no murmurs, normal S1, S2 split Resp: CTAB, no wheezing, rales, or rhonchi, comfortable work of breathing Skin: warm, dry, intact, no rashes  ASSESSMENT/PLAN:   Abnormal uterine bleeding (AUB) Urine pregnancy test was negative and bleeding has resolved. Unlikely to be retained contents as she is not having any more bleeding, no fever, chills, etc to have me worried about endometritis. However, given she has a history of normal periods unclear what the cause of the bleeding is.  DDx fibroids, endometriosis, and irregular periods all possible. - Trial of OCPs offered but patient declined. - If abnormal bleeding restarts I would start with transvaginal and transabdominal ultrasound, CBC w/Diff  Epistaxis Not uncontrolled. Most likely due to nasal/sinus mucosa irritation from allergies and pollen. - Restart Flonase daily - Afrin to use if nose bleeding does not respond to conservative measures     Nuala Alpha, [place]

## 2020-02-17 NOTE — Patient Instructions (Signed)
It was great to meet you today! Thank you for letting me participate in your care!  Today, we discussed your nose bleeding and I have restarted you on Flonase and Afrin. Please use Flonase daily as prescribed to control your allergies as it can contribute to nose bleeds. Also, use Afrin to help stop nose bleeds and use it only as needed. If your problems continue please return to the clinic.  For your vaginal bleeding you elected not to try OCPs today. You can try Naproxen 500mg  BID for cramping pain. If it continues please return to the clinic and we will do further workup.  Be well, Harolyn Rutherford, DO PGY-3, [facility]   Nosebleed, Adult A nosebleed is when blood comes out of the nose. Nosebleeds are common. Usually, they are not a sign of a serious condition. Nosebleeds can happen if a small blood vessel in your nose starts to bleed or if the lining of your nose (mucous membrane) cracks. They are commonly caused by:  Allergies.  Colds.  Picking your nose.  Blowing your nose too hard.  An injury from sticking an object into your nose or getting hit in the nose.  Dry or cold air. Less common causes of nosebleeds include:  Toxic fumes.  Something abnormal in the nose or in the air-filled spaces in the bones of the face (sinuses).  Growths in the nose, such as polyps.  Medicines or conditions that cause blood to clot slowly.  Certain illnesses or procedures that irritate or dry out the nasal passages. Follow these instructions at home: When you have a nosebleed:   Sit down and tilt your head slightly forward.  Use a clean towel or tissue to pinch your nostrils under the bony part of your nose. After 10 minutes, let go of your nose and see if bleeding starts again. Do not release pressure before that time. If there is still bleeding, repeat the pinching and holding for 10 minutes until the bleeding stops.  Do not place tissues or gauze in the nose to stop  bleeding.  Avoid lying down and avoid tilting your head backward. That may make blood collect in the throat and cause gagging or coughing.  Use a nasal spray decongestant to help with a nosebleed as told by your health care provider.  Do not use petroleum jelly or mineral oil in your nose. It can drip into your lungs. After a nosebleed:  Avoid blowing your nose or sniffing for a number of hours.  Avoid straining, lifting, or bending at the waist for several days. You may resume other normal activities as you are able.  Use saline spray or a humidifier as told by your health care provider.  Aspirinand blood thinners make bleeding more likely. If you are prescribed these medicines and you suffer from nosebleeds: ? Ask your health care provider if you should stop taking the medicines or if you should adjust the dose. ? Do not stop taking medicines that your health care provider has recommended unless told by your health care provider.  If your nosebleed was caused by dry mucous membranes, use over-the-counter saline nasal spray or gel. This will keep the mucous membranes moist and allow them to heal. If you must use a lubricant: ? Choose one that is water-soluble. ? Use only as much as you need and use it only as often as needed. ? Do not lie down until several hours after you use it. Contact a health care provider  if:  You have a fever.  You get nosebleeds often or more often than usual.  You bruise very easily.  You have a nosebleed from having something stuck in your nose.  You have bleeding in your mouth.  You vomit or cough up brown material.  You have a nosebleed after you start a new medicine. Get help right away if:  You have a nosebleed after a fall or a head injury.  Your nosebleed does not go away after 20 minutes.  You feel dizzy or weak.  You have unusual bleeding from other parts of your body.  You have unusual bruising on other parts of your body.  You  become sweaty.  You vomit blood. This information is not intended to replace advice given to you by your health care provider. Make sure you discuss any questions you have with your health care provider. Document Revised: 02/05/2018 Document Reviewed: 05/23/2016 Elsevier Patient Education  [place].

## 2020-02-19 NOTE — Assessment & Plan Note (Signed)
Urine pregnancy test was negative and bleeding has resolved. Unlikely to be retained contents as she is not having any more bleeding, no fever, chills, etc to have me worried about endometritis. However, given she has a history of normal periods unclear what the cause of the bleeding is. DDx fibroids, endometriosis, and irregular periods all possible. - Trial of OCPs offered but patient declined. - If abnormal bleeding restarts I would start with transvaginal and transabdominal ultrasound, CBC w/Diff

## 2020-02-19 NOTE — Assessment & Plan Note (Signed)
Not uncontrolled. Most likely due to nasal/sinus mucosa irritation from allergies and pollen. - Restart Flonase daily - Afrin to use if nose bleeding does not respond to conservative measures

## 2020-02-23 ENCOUNTER — Other Ambulatory Visit: Payer: Self-pay

## 2020-02-23 ENCOUNTER — Ambulatory Visit: Payer: BC Managed Care – PPO | Attending: Family Medicine | Admitting: Physical Therapy

## 2020-02-23 ENCOUNTER — Encounter: Payer: Self-pay | Admitting: Physical Therapy

## 2020-02-23 DIAGNOSIS — R252 Cramp and spasm: Secondary | ICD-10-CM | POA: Diagnosis not present

## 2020-02-23 DIAGNOSIS — M6281 Muscle weakness (generalized): Secondary | ICD-10-CM | POA: Diagnosis not present

## 2020-02-23 DIAGNOSIS — R278 Other lack of coordination: Secondary | ICD-10-CM

## 2020-02-23 NOTE — Therapy (Signed)
[facility] [address] [place], [place], [address] Phone: [phone]   Fax:  [phone]  Physical Therapy Evaluation  Patient Details  Name: Christy Barnes MRN: [id number] Date of Birth: [birth year] Referring Provider (PT): Dr. Sherren Mocha McDiarmid   Encounter Date: 02/23/2020  PT End of Session - 02/23/20 1054    Visit Number  1    Date for PT Re-Evaluation  05/17/20    Authorization Type  BCBS    PT Start Time  1025   came late   PT Stop Time  1100    PT Time Calculation (min)  35 min    Activity Tolerance  Patient tolerated treatment well;No increased pain    Behavior During Therapy  WFL for tasks assessed/performed       Past Medical History:  Diagnosis Date  . Labial tear freshman year  . Overweight and obesity(278.0)   . Sickle cell trait ([place]) 06/07/2017    Past Surgical History:  Procedure Laterality Date  . labia repair  freshman year    There were no vitals filed for this visit.   Subjective Assessment - 02/23/20 1027    Subjective  Patient reports frequent urination in 11/21/2019 and has left groin pain for 2 years. When patient has to urinate it may be a little. Urinary leakage.    Patient Stated Goals  strengthen the pelvic floor and left hip    Currently in Pain?  Yes    Pain Score  5    worse is 10/10   Pain Location  Groin    Pain Orientation  Left    Pain Descriptors / Indicators  --   strain, swelling   Pain Type  Chronic pain    Pain Onset  More than a month ago    Pain Frequency  Constant    Aggravating Factors   squats, stand on the left leg and lift right with boxing    Pain Relieving Factors  being active         [facility] PT Assessment - 02/23/20 0001      Assessment   Medical Diagnosis  R35.0 Frequent urination    Referring Provider (PT)  Dr. Sherren Mocha McDiarmid    Onset Date/Surgical Date  11/21/19    Prior Therapy  none      Precautions   Precautions  None      Restrictions   Weight Bearing Restrictions  No      Balance Screen   Has the patient fallen in the past 6 months  No    Has the patient had a decrease in activity level because of a fear of falling?   No    Is the patient reluctant to leave their home because of a fear of falling?   No      Home Film/video editor residence      Prior Function   Level of Independence  Independent    Vocation  Full time employment    Vocation Requirements  sitting , sitting     Leisure  walking, boxing       Cognition   Overall Cognitive Status  Within Functional Limits for tasks assessed      Posture/Postural Control   Posture/Postural Control  No significant limitations      ROM / Strength   AROM / PROM / Strength  AROM;PROM;Strength      Strength   Right Hip Flexion  4/5  Right Hip External Rotation   4/5    Right Hip Internal Rotation  4/5    Right Hip ABduction  4/5    Left Hip External Rotation  4/5    Left Hip Internal Rotation  4/5    Left Hip ABduction  4/5      Palpation   SI assessment   left ilium rotated posteriorly    Palpation comment  tenderness located on lower abdominal area, left psoas      Special Tests    Special Tests  Sacrolliac Tests    Sacroiliac Tests   Pelvic Distraction      Pelvic Dictraction   Findings  Positive    Side   Left    Comment  pain                Objective measurements completed on examination: See above findings.    Pelvic Floor Special Questions - 02/23/20 0001    Prior Pregnancies  No    Currently Sexually Active  Yes    Is this Painful  Yes   pain in left groin   Urinary Leakage  Yes    Activities that cause leaking  With strong urge;Laughing    Urinary urgency  Yes    Fluid intake  drink alot of water    Skin Integrity  Intact    Pelvic Floor Internal Exam  Patient confirms identification and approves PT to assess pelvic floor    Exam Type  Vaginal    Palpation  tenderness located on the  left levator ani and obturator internist    Strength  weak squeeze, no lift    Strength # of seconds  5       [facility] Adult PT Treatment/Exercise - 02/23/20 0001      Self-Care   Self-Care  Other Self-Care Comments    Other Self-Care Comments   educated patient on how to perfrom her own self massage to the lefl pelvic floor muscles and used a mirror to educate on the pelvic anatomy             PT Education - 02/23/20 1547    Education Details  educated patient on how to massage the left perineal area to relax the tissue    Person(s) Educated  Patient    Methods  Explanation;Demonstration    Comprehension  Returned demonstration;Verbalized understanding       PT Short Term Goals - 02/23/20 1553      PT SHORT TERM GOAL #1   Title  Independent with initial HEP    Time  4    Period  Weeks    Status  New    Target Date  03/22/20      PT SHORT TERM GOAL #2   Title  understand how to delay the urge to void so her bladder could full more    Time  4    Period  Weeks    Status  New    Target Date  03/22/20      PT SHORT TERM GOAL #3   Title  pelvis in correct alignment so her left hip pain decreased >/= 25%    Time  4    Period  Weeks    Status  New    Target Date  03/22/20        PT Long Term Goals - 02/23/20 1556      PT LONG TERM GOAL #1   Title  independent with advanced  HEP    Time  12    Period  Weeks    Status  New    Target Date  05/17/20      PT LONG TERM GOAL #2   Title  able to fully empty her bladder with 8 oz of urine or more due to not going to urinate as frequently    Time  12    Period  Weeks    Status  New    Target Date  05/17/20      PT LONG TERM GOAL #3   Title  able to squat or stand on the left leg to kick in kickboxing with left hip pain decreased >/= 75%    Time  12    Period  Weeks    Status  New    Target Date  05/17/20      PT LONG TERM GOAL #4   Title  pelvic floor strength >/= 3/5 holding for 10 seconds so her urinary  leakage decreased >/= 75%    Time  12    Period  Weeks    Status  New    Target Date  05/17/20             Plan - 02/23/20 1055    Clinical Impression Statement  Patient is a 24 year old female with urinary leakage when she has the urge to void and left hip pain. Patient reports her left hip pain is intermittent at level 5/10 with squatting and standing on left leg to kick with right in kick boxing. Patient has to go to the bathroom frequently with very little urine coming out at times. Patient left ilium is rotated posteriorly and Positive SI distraction test on the left with pain. Patient has decreased strength of left hip. Patient has tenderness located in left psoas, lower abdomen, and left levator ani and left obturator internist. Pelvic floor strength is 2/5. Patient has pain with intercourse  in the left groin. Patient will benefit from skilled therapy to improve pelvic floor strength to reduce leakage and work on left hip pain.    Personal Factors and Comorbidities  Fitness    Examination-Activity Limitations  Toileting;Continence    Stability/Clinical Decision Making  Stable/Uncomplicated    Clinical Decision Making  Low    Rehab Potential  Excellent    PT Frequency  1x / week    PT Duration  12 weeks    PT Treatment/Interventions  Biofeedback;Cryotherapy;Electrical Stimulation;Iontophoresis 4mg /ml Dexamethasone;Moist Heat;Ultrasound;Neuromuscular re-education;Therapeutic exercise;Therapeutic activities;Patient/family education;Manual techniques;Dry needling;Joint Manipulations    PT Next Visit Plan  correct left ilium, internal soft tissue work to left pelvic floor, left hip stretches, core strength with pelvic floor contraction; soft tissue work to the lower abdomen, left psoas    Consulted and Agree with Plan of Care  Patient       Patient will benefit from skilled therapeutic intervention in order to improve the following deficits and impairments:  Decreased coordination,  Decreased range of motion, Increased fascial restricitons, Pain, Decreased strength  Visit Diagnosis: Muscle weakness (generalized) - Plan: PT plan of care cert/re-cert  Other lack of coordination - Plan: PT plan of care cert/re-cert  Cramp and spasm - Plan: PT plan of care cert/re-cert     Problem List Patient Active Problem List   Diagnosis Date Noted  . Abnormal uterine bleeding (AUB) 02/17/2020  . Groin pain, chronic, left 09/17/2019  . Ear fullness 09/17/2019  . Desire for pregnancy 09/17/2019  . Pelvic pressure  in female 07/22/2019  . Frequent urination 06/24/2019  . Left arm pain 06/02/2019  . Vaginal discharge 03/26/2019  . MDD (major depressive disorder), severe ([place]) 11/09/2017  . Impulse control disorder 11/09/2017  . Epistaxis 11/24/2015  . Allergic rhinitis 05/08/2014  . Obesity (BMI 30-39.9) 11/04/2013    Earlie Counts, PT 02/23/20 3:59 PM   [facility] [address] [place], [place], [address] Phone: [phone]   Fax:  [phone]  Name: Christy Barnes MRN: [id number] Date of Birth: December 31, 1995

## 2020-03-15 ENCOUNTER — Ambulatory Visit: Payer: BC Managed Care – PPO | Admitting: Physical Therapy

## 2020-03-15 ENCOUNTER — Encounter: Payer: Self-pay | Admitting: Physical Therapy

## 2020-03-15 ENCOUNTER — Other Ambulatory Visit: Payer: Self-pay

## 2020-03-15 DIAGNOSIS — R252 Cramp and spasm: Secondary | ICD-10-CM | POA: Diagnosis not present

## 2020-03-15 DIAGNOSIS — R278 Other lack of coordination: Secondary | ICD-10-CM

## 2020-03-15 DIAGNOSIS — M6281 Muscle weakness (generalized): Secondary | ICD-10-CM

## 2020-03-15 NOTE — Therapy (Signed)
[facility] [address], [place], [place], [address] Phone: [phone]   Fax:  [phone]  Physical Therapy Treatment  Patient Details  Name: Christy Barnes MRN: [id number] Date of Birth: August 19, 1996 Referring Provider (PT): Dr. Sherren Mocha McDiarmid   Encounter Date: 03/15/2020  PT End of Session - 03/15/20 1727    Visit Number  2    Date for PT Re-Evaluation  05/17/20    Authorization Type  BCBS    PT Start Time  1015    PT Stop Time  1055    PT Time Calculation (min)  40 min    Activity Tolerance  Patient tolerated treatment well;No increased pain    Behavior During Therapy  WFL for tasks assessed/performed       Past Medical History:  Diagnosis Date  . Labial tear freshman year  . Overweight and obesity(278.0)   . Sickle cell trait ([place]) 06/07/2017    Past Surgical History:  Procedure Laterality Date  . labia repair  freshman year    There were no vitals filed for this visit.  Subjective Assessment - 03/15/20 1711    Subjective  I feel pretty good today. The stretches help. Leakage is 60% better. Urinate every 1.5 hours.    Patient Stated Goals  strengthen the pelvic floor and left hip    Currently in Pain?  Yes    Pain Score  6     Pain Location  Groin    Pain Orientation  Left    Pain Descriptors / Indicators  --   strain, swelling   Pain Type  Chronic pain    Pain Onset  More than a month ago    Pain Frequency  Constant    Aggravating Factors   squats, stand on the left leg and lift right with boxing    Pain Relieving Factors  being active                    Pelvic Floor Special Questions - 03/15/20 0001    Pelvic Floor Internal Exam  Patient confirms identification and approves PT to assess pelvic floor    Exam Type  Vaginal    Strength  fair squeeze, definite lift        [facility] Adult PT Treatment/Exercise - 03/15/20 0001      Neuro Re-ed    Neuro Re-ed Details   circular  pelvic floor contraction with not conracting the gluteals and hold breath      Knee/Hip Exercises: Sidelying   Other Sidelying Knee/Hip Exercises  lay on side with towerl roll between knees and feet bringing the hip post.       Knee/Hip Exercises: Prone   Other Prone Exercises  prone left hip extension iwth gluteal first and not lifting the ilium up      Manual Therapy   Manual Therapy  Internal Pelvic Floor;Muscle Energy Technique;Joint mobilization    Joint Mobilization  to correct sacrum; left hip distraction and inferior mobilization to the lfet hip    Internal Pelvic Floor  soft tissue work to the left  pelvic floor muscles, obturator internist, levator ani    Muscle Energy Technique  correct left ilium               PT Short Term Goals - 02/23/20 1553      PT SHORT TERM GOAL #1   Title  Independent with initial HEP    Time  4  Period  Weeks    Status  New    Target Date  03/22/20      PT SHORT TERM GOAL #2   Title  understand how to delay the urge to void so her bladder could full more    Time  4    Period  Weeks    Status  New    Target Date  03/22/20      PT SHORT TERM GOAL #3   Title  pelvis in correct alignment so her left hip pain decreased >/= 25%    Time  4    Period  Weeks    Status  New    Target Date  03/22/20        PT Long Term Goals - 02/23/20 1556      PT LONG TERM GOAL #1   Title  independent with advanced HEP    Time  12    Period  Weeks    Status  New    Target Date  05/17/20      PT LONG TERM GOAL #2   Title  able to fully empty her bladder with 8 oz of urine or more due to not going to urinate as frequently    Time  12    Period  Weeks    Status  New    Target Date  05/17/20      PT LONG TERM GOAL #3   Title  able to squat or stand on the left leg to kick in kickboxing with left hip pain decreased >/= 75%    Time  12    Period  Weeks    Status  New    Target Date  05/17/20      PT LONG TERM GOAL #4   Title  pelvic  floor strength >/= 3/5 holding for 10 seconds so her urinary leakage decreased >/= 75%    Time  12    Period  Weeks    Status  New    Target Date  05/17/20            Plan - 03/15/20 1728    Clinical Impression Statement  Patient reports her leakage is 60% better. After therapy pelvis was in correct alignment. Pelvic floor strength increased to 3/5 with needing verbal cues to contract the pelvic floor without contracting the gluteals. Patient had tenderness and tightness on the left pelvic floor. Patient continues to have pain in left hip with flexion. Patient will benefit from skilled therapy to improv epelvic floor strength to reduce leakage and work on left hip pain.    Personal Factors and Comorbidities  Fitness    Examination-Activity Limitations  Toileting;Continence    Stability/Clinical Decision Making  Stable/Uncomplicated    Rehab Potential  Excellent    PT Frequency  1x / week    PT Duration  12 weeks    PT Treatment/Interventions  Biofeedback;Cryotherapy;Electrical Stimulation;Iontophoresis 4mg /ml Dexamethasone;Moist Heat;Ultrasound;Neuromuscular re-education;Therapeutic exercise;Therapeutic activities;Patient/family education;Manual techniques;Dry needling;Joint Manipulations    PT Next Visit Plan  correct left ilium, internal soft tissue work to left pelvic floor, left hip stretches, core strength with pelvic floor contraction; soft tissue work to the lower abdomen, left psoas, work on left hip adduction and IR    Recommended Other Services  MD signed initial eval    Consulted and Agree with Plan of Care  Patient       Patient will benefit from skilled therapeutic intervention in order to improve the following deficits  and impairments:  Decreased coordination, Decreased range of motion, Increased fascial restricitons, Pain, Decreased strength  Visit Diagnosis: Muscle weakness (generalized)  Other lack of coordination  Cramp and spasm     Problem List Patient  Active Problem List   Diagnosis Date Noted  . Abnormal uterine bleeding (AUB) 02/17/2020  . Groin pain, chronic, left 09/17/2019  . Ear fullness 09/17/2019  . Desire for pregnancy 09/17/2019  . Pelvic pressure in female 07/22/2019  . Frequent urination 06/24/2019  . Left arm pain 06/02/2019  . Vaginal discharge 03/26/2019  . MDD (major depressive disorder), severe ([place]) 11/09/2017  . Impulse control disorder 11/09/2017  . Epistaxis 11/24/2015  . Allergic rhinitis 05/08/2014  . Obesity (BMI 30-39.9) 11/04/2013    Earlie Counts, PT 03/15/20 5:32 PM   [facility] [address] [place], [place], [address] Phone: [phone]   Fax:  [phone]  Name: Christy Barnes MRN: [id number] Date of Birth: 1996/07/07

## 2020-03-25 ENCOUNTER — Other Ambulatory Visit: Payer: Self-pay

## 2020-03-25 ENCOUNTER — Encounter: Payer: Self-pay | Admitting: Physical Therapy

## 2020-03-25 ENCOUNTER — Ambulatory Visit: Payer: BC Managed Care – PPO | Attending: Family Medicine | Admitting: Physical Therapy

## 2020-03-25 DIAGNOSIS — R252 Cramp and spasm: Secondary | ICD-10-CM | POA: Insufficient documentation

## 2020-03-25 DIAGNOSIS — M6281 Muscle weakness (generalized): Secondary | ICD-10-CM

## 2020-03-25 DIAGNOSIS — R278 Other lack of coordination: Secondary | ICD-10-CM | POA: Insufficient documentation

## 2020-03-25 NOTE — Patient Instructions (Signed)
Access Code: [email or web address] URL: [email or web address] Date: 03/25/2020 Prepared by: Earlie Counts  Exercises Seated Piriformis Stretch with Trunk Bend - 1 x daily - 7 x weekly - 1 sets - 2 reps - 30 sec hold Seated Hamstring Stretch - 1 x daily - 7 x weekly - 1 sets - 2 reps - 30 sec hold Supine Pelvic Floor Stretch - 1 x daily - 7 x weekly - 1 sets - 1 reps - 1 min hold Supine Lumbar Rotation Stretch - 1 x daily - 7 x weekly - 1 sets - 2 reps - 30 sec hold Cat-Camel - 1 x daily - 7 x weekly - 1 sets - 10 reps Sidelying Reverse Clamshell - 1 x daily - 7 x weekly - 1 sets - 10 reps  Patient Education [facility] [address], [place], [place] [address] Phone # [phone] Fax [phone]

## 2020-03-25 NOTE — Therapy (Signed)
[facility] [address], [place], [place], [address] Phone: [phone]   Fax:  [phone]  Physical Therapy Treatment  Patient Details  Name: Christy Barnes MRN: [id number] Date of Birth: [birth year] Referring Provider (PT): Dr. Sherren Mocha McDiarmid   Encounter Date: 03/25/2020  PT End of Session - 03/25/20 1152    Visit Number  3    Date for PT Re-Evaluation  05/17/20    Authorization Type  BCBS    PT Start Time  1152   went to the bathroom   PT Stop Time  1230    PT Time Calculation (min)  38 min    Activity Tolerance  Patient tolerated treatment well;No increased pain    Behavior During Therapy  WFL for tasks assessed/performed       Past Medical History:  Diagnosis Date  . Labial tear freshman year  . Overweight and obesity(278.0)   . Sickle cell trait ([place]) 06/07/2017    Past Surgical History:  Procedure Laterality Date  . labia repair  freshman year    There were no vitals filed for this visit.  Subjective Assessment - 03/25/20 1153    Subjective  Monday and Tuesday had trouble walking, I have done the stretches. leakage is 50% better.    Patient Stated Goals  strengthen the pelvic floor and left hip    Currently in Pain?  Yes    Pain Score  7     Pain Location  Groin    Pain Orientation  Left    Pain Descriptors / Indicators  Dull;Sharp;Spasm    Pain Type  Chronic pain    Pain Onset  More than a month ago    Pain Frequency  Constant    Aggravating Factors   squats, stand on the left leg and lift right with boxing    Pain Relieving Factors  being active    Multiple Pain Sites  No         [facility] PT Assessment - 03/25/20 0001      Palpation   SI assessment   left ilium rotated posteriorly                   [facility] Adult PT Treatment/Exercise - 03/25/20 0001      Lumbar Exercises: Stretches   Active Hamstring Stretch  Right;Left;1 rep;30 seconds    Active Hamstring Stretch  Limitations  sitting    Lower Trunk Rotation  2 reps;30 seconds    Lower Trunk Rotation Limitations  supine, right, left    Piriformis Stretch  Right;Left;1 rep;30 seconds    Piriformis Stretch Limitations  sitting    Other Lumbar Stretch Exercise  happy baby      Lumbar Exercises: Quadruped   [place]/Old Horse  10 reps      Knee/Hip Exercises: Sidelying   Clams  reverse clam bil. 10x to elongate the deep hip rotators      Manual Therapy   Manual Therapy  Soft tissue mobilization;Joint mobilization;Muscle Energy Technique    Joint Mobilization  PA and rotational mobilization to L1-L5 grade III    Soft tissue mobilization  using the addaday to the lumbar paraspinals and left gluteals and left lateral hip  to elongate muslces after dry needling       Trigger Point Dry Needling - 03/25/20 0001    Consent Given?  Yes    Education Handout Provided  Yes    Muscles Treated Back/Hip  Gluteus medius;Gluteus maximus;Lumbar  multifidi   left   Gluteus Medius Response  Twitch response elicited;Palpable increased muscle length    Gluteus Maximus Response  Twitch response elicited;Palpable increased muscle length    Lumbar multifidi Response  Twitch response elicited;Palpable increased muscle length           PT Education - 03/25/20 1233    Education Details  Access Code: [email or web address]; information on dry needling    Person(s) Educated  Patient    Methods  Explanation;Demonstration;Verbal cues;Handout    Comprehension  Verbalized understanding;Returned demonstration       PT Short Term Goals - 03/25/20 1328      PT SHORT TERM GOAL #1   Title  Independent with initial HEP    Time  4    Period  Weeks    Status  Achieved      PT SHORT TERM GOAL #2   Title  understand how to delay the urge to void so her bladder could full more    Time  4    Period  Weeks    Status  On-going      PT SHORT TERM GOAL #3   Title  pelvis in correct alignment so her left hip pain decreased >/= 25%     Time  4    Period  Weeks    Status  On-going        PT Long Term Goals - 02/23/20 1556      PT LONG TERM GOAL #1   Title  independent with advanced HEP    Time  12    Period  Weeks    Status  New    Target Date  05/17/20      PT LONG TERM GOAL #2   Title  able to fully empty her bladder with 8 oz of urine or more due to not going to urinate as frequently    Time  12    Period  Weeks    Status  New    Target Date  05/17/20      PT LONG TERM GOAL #3   Title  able to squat or stand on the left leg to kick in kickboxing with left hip pain decreased >/= 75%    Time  12    Period  Weeks    Status  New    Target Date  05/17/20      PT LONG TERM GOAL #4   Title  pelvic floor strength >/= 3/5 holding for 10 seconds so her urinary leakage decreased >/= 75%    Time  12    Period  Weeks    Status  New    Target Date  05/17/20            Plan - 03/25/20 1152    Clinical Impression Statement  Patient urinary leakage is 50% better and declined by 10%. Patient has walked but has increased left hip pain. Patient left ilium was aligned after manual work. Patient has tightness in the left deep hip rotators, left lumbar and gluteal. Patient still has vaginal pain.  Patient has tightness in the lumbar sacral area so worked on mobility. Patient will benefit from skilled therapy to improve pelvic floor strength to redue leakage and work on left hip.    Personal Factors and Comorbidities  Fitness    Examination-Activity Limitations  Toileting;Continence    Stability/Clinical Decision Making  Stable/Uncomplicated    Rehab Potential  Excellent    PT Frequency  1x /  week    PT Duration  12 weeks    PT Treatment/Interventions  Biofeedback;Cryotherapy;Electrical Stimulation;Iontophoresis 4mg /ml Dexamethasone;Moist Heat;Ultrasound;Neuromuscular re-education;Therapeutic exercise;Therapeutic activities;Patient/family education;Manual techniques;Dry needling;Joint Manipulations    PT Next Visit  Plan  assess dry needling, work on deep hip rotators, internal soft tissue work to the pelvic floor, check pelvic alignment, urge to void    PT Home Exercise Plan  Access Code: [email or web address] and Agree with Plan of Care  Patient       Patient will benefit from skilled therapeutic intervention in order to improve the following deficits and impairments:  Decreased coordination, Decreased range of motion, Increased fascial restricitons, Pain, Decreased strength  Visit Diagnosis: Muscle weakness (generalized)  Other lack of coordination  Cramp and spasm     Problem List Patient Active Problem List   Diagnosis Date Noted  . Abnormal uterine bleeding (AUB) 02/17/2020  . Groin pain, chronic, left 09/17/2019  . Ear fullness 09/17/2019  . Desire for pregnancy 09/17/2019  . Pelvic pressure in female 07/22/2019  . Frequent urination 06/24/2019  . Left arm pain 06/02/2019  . Vaginal discharge 03/26/2019  . MDD (major depressive disorder), severe ([place]) 11/09/2017  . Impulse control disorder 11/09/2017  . Epistaxis 11/24/2015  . Allergic rhinitis 05/08/2014  . Obesity (BMI 30-39.9) 11/04/2013    Earlie Counts, PT 03/25/20 1:30 PM   [facility] [address], [place], [place], [address] Phone: [phone]   Fax:  [phone]  Name: Christy Barnes MRN: [id number] Date of Birth: 1996/01/20

## 2020-03-29 ENCOUNTER — Other Ambulatory Visit: Payer: Self-pay

## 2020-03-29 ENCOUNTER — Encounter: Payer: Self-pay | Admitting: Physical Therapy

## 2020-03-29 ENCOUNTER — Ambulatory Visit: Payer: BC Managed Care – PPO | Admitting: Physical Therapy

## 2020-03-29 DIAGNOSIS — R252 Cramp and spasm: Secondary | ICD-10-CM | POA: Diagnosis not present

## 2020-03-29 DIAGNOSIS — R278 Other lack of coordination: Secondary | ICD-10-CM | POA: Diagnosis not present

## 2020-03-29 DIAGNOSIS — M6281 Muscle weakness (generalized): Secondary | ICD-10-CM | POA: Diagnosis not present

## 2020-03-29 NOTE — Therapy (Signed)
[facility] [address], [place], [place], [address] Phone: [phone]   Fax:  [phone]  Physical Therapy Treatment  Patient Details  Name: Christy Barnes MRN: [id number] Date of Birth: 1996/05/24 Referring Provider (PT): Dr. Sherren Mocha McDiarmid   Encounter Date: 03/29/2020  PT End of Session - 03/29/20 1020    Visit Number  4    Date for PT Re-Evaluation  05/17/20    Authorization Type  BCBS    PT Start Time  1015    PT Stop Time  1055    PT Time Calculation (min)  40 min    Activity Tolerance  Patient tolerated treatment well;No increased pain    Behavior During Therapy  WFL for tasks assessed/performed       Past Medical History:  Diagnosis Date  . Labial tear freshman year  . Overweight and obesity(278.0)   . Sickle cell trait ([place]) 06/07/2017    Past Surgical History:  Procedure Laterality Date  . labia repair  freshman year    There were no vitals filed for this visit.  Subjective Assessment - 03/29/20 1018    Subjective  I felt good after the dry needling. My left hip pain and stiffness was decreased. Patient is on her cycle and trouble telling if she leaks. I still urinate alot. When at home I urinate less than 2 hours  but when at work can wait 2 hours.    Patient Stated Goals  strengthen the pelvic floor and left hip    Currently in Pain?  Yes    Pain Score  6     Pain Location  Groin    Pain Orientation  Left    Pain Descriptors / Indicators  Dull;Sharp;Spasm    Pain Type  Chronic pain    Pain Onset  More than a month ago    Pain Frequency  Constant    Aggravating Factors   squats, stand on the left leg and lift right with boxing,    Pain Relieving Factors  being active    Multiple Pain Sites  No         [facility] PT Assessment - 03/29/20 0001      Palpation   SI assessment   ASIS are equal                   [facility] Adult PT Treatment/Exercise - 03/29/20 0001      Neuro  Re-ed    Neuro Re-ed Details   urge drill to delay the need to urinate and take control of the bladder, not urinating less than every 2 hours, performins 5 quick contractions to reduce the need to urinate.       Exercises   Exercises  Other Exercises    Other Exercises   squat midway working on tucking in tail bone to flattern lumbar, keep her feet flat and  contracting the pelvci floor      Manual Therapy   Manual Therapy  Soft tissue mobilization;Joint mobilization;Passive ROM    Joint Mobilization  anterior glide of the left femoral head grade 3    Soft tissue mobilization  using the addaday to the lumbar paraspinals and left gluteals and left lateral hip  to elongate muslces after dry needling    Passive ROM  manually stretched the hip rotators and hip flexors       Trigger Point Dry Needling - 03/29/20 0001    Consent Given?  Yes  Education Handout Provided  Previously provided    Muscles Treated Back/Hip  Gluteus medius;Gluteus maximus;Lumbar multifidi;Piriformis    Gluteus Medius Response  Twitch response elicited;Palpable increased muscle length   left   Gluteus Maximus Response  Twitch response elicited;Palpable increased muscle length   left   Piriformis Response  Twitch response elicited;Palpable increased muscle length   left   Lumbar multifidi Response  Twitch response elicited;Palpable increased muscle length   both          PT Education - 03/29/20 1100    Education Details  Access Code: [email or web address]    Person(s) Educated  Patient    Methods  Explanation;Demonstration;Handout    Comprehension  Verbalized understanding;Returned demonstration       PT Short Term Goals - 03/25/20 1328      PT SHORT TERM GOAL #1   Title  Independent with initial HEP    Time  4    Period  Weeks    Status  Achieved      PT SHORT TERM GOAL #2   Title  understand how to delay the urge to void so her bladder could full more    Time  4    Period  Weeks    Status  On-going       PT SHORT TERM GOAL #3   Title  pelvis in correct alignment so her left hip pain decreased >/= 25%    Time  4    Period  Weeks    Status  On-going        PT Long Term Goals - 02/23/20 1556      PT LONG TERM GOAL #1   Title  independent with advanced HEP    Time  12    Period  Weeks    Status  New    Target Date  05/17/20      PT LONG TERM GOAL #2   Title  able to fully empty her bladder with 8 oz of urine or more due to not going to urinate as frequently    Time  12    Period  Weeks    Status  New    Target Date  05/17/20      PT LONG TERM GOAL #3   Title  able to squat or stand on the left leg to kick in kickboxing with left hip pain decreased >/= 75%    Time  12    Period  Weeks    Status  New    Target Date  05/17/20      PT LONG TERM GOAL #4   Title  pelvic floor strength >/= 3/5 holding for 10 seconds so her urinary leakage decreased >/= 75%    Time  12    Period  Weeks    Status  New    Target Date  05/17/20            Plan - 03/29/20 1052    Clinical Impression Statement  Patient will go to the bathroom more often when at home and less when out of home. Patient has tightness in the hip rotators. Patient will leak on the way to the bathroom. Patient is able to squat but needs tactile cues to flatten her lumbar, not bring knees past the toes and keep the feet flat. Patient has improve tissue mobility of the lumbar. Patient ASIS are equal. Patiet will benefit from skilled therapy to improve pelvic floor strength  to reduce leakage and work  on left hip.    Personal Factors and Comorbidities  Fitness    Examination-Activity Limitations  Toileting;Continence    Stability/Clinical Decision Making  Stable/Uncomplicated    Rehab Potential  Excellent    PT Frequency  1x / week    PT Duration  12 weeks    PT Treatment/Interventions  Biofeedback;Cryotherapy;Electrical Stimulation;Iontophoresis 4mg /ml Dexamethasone;Moist Heat;Ultrasound;Neuromuscular  re-education;Therapeutic exercise;Therapeutic activities;Patient/family education;Manual techniques;Dry needling;Joint Manipulations    PT Next Visit Plan  dry needling to the gluteals, work flattening lumbar, work on squats, work on pelvic floor contraction with control, check hip strength    PT Home Exercise Plan  Access Code: [email or web address]    Consulted and Agree with Plan of Care  Patient       Patient will benefit from skilled therapeutic intervention in order to improve the following deficits and impairments:  Decreased coordination, Decreased range of motion, Increased fascial restricitons, Pain, Decreased strength  Visit Diagnosis: Muscle weakness (generalized)  Other lack of coordination  Cramp and spasm     Problem List Patient Active Problem List   Diagnosis Date Noted  . Abnormal uterine bleeding (AUB) 02/17/2020  . Groin pain, chronic, left 09/17/2019  . Ear fullness 09/17/2019  . Desire for pregnancy 09/17/2019  . Pelvic pressure in female 07/22/2019  . Frequent urination 06/24/2019  . Left arm pain 06/02/2019  . Vaginal discharge 03/26/2019  . MDD (major depressive disorder), severe ([place]) 11/09/2017  . Impulse control disorder 11/09/2017  . Epistaxis 11/24/2015  . Allergic rhinitis 05/08/2014  . Obesity (BMI 30-39.9) 11/04/2013    Earlie Counts, PT 03/29/20 11:01 AM   [facility] [address], [place], [place], [address] Phone: [phone]   Fax:  [phone]  Name: Christy Barnes MRN: [id number] Date of Birth: 09-08-1996

## 2020-03-29 NOTE — Patient Instructions (Signed)
Access Code: [email or web address] URL: [email or web address] Date: 03/25/2020 Prepared by: Earlie Counts  Exercises Seated Piriformis Stretch with Trunk Bend - 1 x daily - 7 x weekly - 1 sets - 2 reps - 30 sec hold Seated Hamstring Stretch - 1 x daily - 7 x weekly - 1 sets - 2 reps - 30 sec hold Supine Pelvic Floor Stretch - 1 x daily - 7 x weekly - 1 sets - 1 reps - 1 min hold Supine Lumbar Rotation Stretch - 1 x daily - 7 x weekly - 1 sets - 2 reps - 30 sec hold Cat-Camel - 1 x daily - 7 x weekly - 1 sets - 10 reps Sidelying Reverse Clamshell - 1 x daily - 7 x weekly - 1 sets - 10 reps Mini Squat with Counter Support - 1 x daily - 7 x weekly - 1 sets - 10 reps  Patient Education Trigger [facility] Needling [facility] [address] [place], [place] [address] Phone # [phone] Fax [phone]

## 2020-04-05 ENCOUNTER — Ambulatory Visit: Payer: BC Managed Care – PPO | Admitting: Physical Therapy

## 2020-04-05 ENCOUNTER — Other Ambulatory Visit: Payer: Self-pay

## 2020-04-05 ENCOUNTER — Encounter: Payer: Self-pay | Admitting: Physical Therapy

## 2020-04-05 DIAGNOSIS — R252 Cramp and spasm: Secondary | ICD-10-CM

## 2020-04-05 DIAGNOSIS — M6281 Muscle weakness (generalized): Secondary | ICD-10-CM

## 2020-04-05 DIAGNOSIS — R278 Other lack of coordination: Secondary | ICD-10-CM

## 2020-04-05 NOTE — Patient Instructions (Signed)
Access Code: [email or web address] URL: [email or web address] Date: 03/25/2020 Prepared by: Earlie Counts  Exercises Seated Piriformis Stretch with Trunk Bend - 1 x daily - 7 x weekly - 1 sets - 2 reps - 30 sec hold Seated Hamstring Stretch - 1 x daily - 7 x weekly - 1 sets - 2 reps - 30 sec hold Supine Pelvic Floor Stretch - 1 x daily - 7 x weekly - 1 sets - 1 reps - 1 min hold Supine Lumbar Rotation Stretch - 1 x daily - 7 x weekly - 1 sets - 2 reps - 30 sec hold Cat-Camel - 1 x daily - 7 x weekly - 1 sets - 10 reps Sidelying Reverse Clamshell - 1 x daily - 7 x weekly - 1 sets - 10 reps Mini Squat with Counter Support - 1 x daily - 7 x weekly - 1 sets - 10 reps Supine Serratus Punches Resistance - 1 x daily - 7 x weekly - 4 sets - 5 reps Supine Transversus Abdominis Bracing with Heel Slide - 1 x daily - 7 x weekly - 2 sets - 10 reps Seated Pelvic Floor Contraction with Isometric Hip Adduction - 2 x daily - 7 x weekly - 1 sets - 10 reps - 5 sec hold  Patient Education Trigger [facility] Dry Needling [facility] [address], [place], [place] [address] Phone # [phone] Fax [phone]

## 2020-04-05 NOTE — Therapy (Signed)
[facility] [address] [place], [place], [address] Phone: [phone]   Fax:  [phone]  Physical Therapy Treatment  Patient Details  Name: Christy Barnes MRN: [id number] Date of Birth: [birth year] Referring Provider (PT): Dr. Sherren Mocha McDiarmid   Encounter Date: 04/05/2020  PT End of Session - 04/05/20 1056    Visit Number  5    Date for PT Re-Evaluation  05/17/20    Authorization Type  BCBS    PT Start Time  1015    PT Stop Time  1053    PT Time Calculation (min)  38 min    Activity Tolerance  Patient tolerated treatment well;No increased pain    Behavior During Therapy  WFL for tasks assessed/performed       Past Medical History:  Diagnosis Date  . Labial tear freshman year  . Overweight and obesity(278.0)   . Sickle cell trait ([place]) 06/07/2017    Past Surgical History:  Procedure Laterality Date  . labia repair  freshman year    There were no vitals filed for this visit.  Subjective Assessment - 04/05/20 1018    Subjective  After the dry needling I had groin pain. I had leakage that is bad. When I drink alcohol I leak.    Patient Stated Goals  strengthen the pelvic floor and left hip    Currently in Pain?  Yes    Pain Score  5     Pain Location  Groin    Pain Orientation  Right;Left    Pain Descriptors / Indicators  Dull;Sharp;Spasm    Pain Type  Chronic pain    Pain Onset  More than a month ago    Pain Frequency  Constant    Aggravating Factors   squats, stand on the left leg and lift right with boxing    Pain Relieving Factors  being active    Multiple Pain Sites  No         [facility] PT Assessment - 04/05/20 0001      Strength   Right Hip Flexion  4+/5    Right Hip External Rotation   5/5    Right Hip Internal Rotation  5/5    Right Hip ABduction  5/5    Left Hip External Rotation  5/5    Left Hip Internal Rotation  5/5    Left Hip ABduction  5/5      Palpation   SI assessment    ASIS are equal; right is shallow from left                 Pelvic Floor Special Questions - 04/05/20 0001    Pelvic Floor Internal Exam  Patient confirms identification and approves PT to assess pelvic floor    Exam Type  Vaginal    Palpation  tenderness located in the posterior pelvic floor    Strength  fair squeeze, definite lift    Strength # of seconds  4        [facility] Adult PT Treatment/Exercise - 04/05/20 0001      Neuro Re-ed    Neuro Re-ed Details   pelvic floor contraction around therapist finger as she punches forward with her arm then leg slides; cough and contract the pelvic floor      Lumbar Exercises: Seated   Other Seated Lumbar Exercises  seated pelvic floor contraction with pillow squeeze hold 5 sec 10x      Lumbar Exercises: Supine  Heel Slides  10 reps    Heel Slides Limitations  with pelvic floor contraction    Other Supine Lumbar Exercises  serratus anterior punches with red band and pelvic floor contraction      Knee/Hip Exercises: Sidelying   Clams  reverse clam bil. 10x to elongate the deep hip rotators with red band      Manual Therapy   Manual Therapy  Joint mobilization;Internal Pelvic Floor    Joint Mobilization  PA and rotational mobilization to lumbar and mid thoracic to correct rotation in the sacrum    Internal Pelvic Floor  gentle soft tissue work to the pelvic floor muscles             PT Education - 04/05/20 1054    Education Details  Access Code: [email or web address]    Person(s) Educated  Patient    Methods  Explanation;Demonstration;Verbal cues;Handout    Comprehension  Verbalized understanding;Returned demonstration       PT Short Term Goals - 04/05/20 1021      PT SHORT TERM GOAL #2   Title  understand how to delay the urge to void so her bladder could full more    Time  4    Period  Weeks    Status  On-going    Target Date  03/22/20      PT SHORT TERM GOAL #3   Title  pelvis in correct alignment so her left hip pain  decreased >/= 25%    Baseline  leveled, right is shallow from left    Time  4    Period  Weeks    Status  Achieved        PT Long Term Goals - 04/05/20 1059      PT LONG TERM GOAL #1   Title  independent with advanced HEP    Time  12    Period  Weeks    Status  On-going      PT LONG TERM GOAL #2   Title  able to fully empty her bladder with 8 oz of urine or more due to not going to urinate as frequently    Time  12    Period  Weeks    Status  On-going      PT LONG TERM GOAL #3   Title  able to squat or stand on the left leg to kick in kickboxing with left hip pain decreased >/= 75%    Time  12    Period  Weeks    Status  On-going            Plan - 04/05/20 1042    Clinical Impression Statement  Patient continues to have urinary leakage. Patient has groin pain. Patient pelvic floor strength is 3/5 for 4 seconds. Patient has slight tenderness located iin the posterior pelvic floor. Patient has increased in bilateral hip strength and pelvis in in correct alignment after manual work. Patient will benefit from skilled therapy to improve pelvic floor strength  to reduce leakage and work on left hip.    Personal Factors and Comorbidities  Fitness    Examination-Activity Limitations  Toileting;Continence    Stability/Clinical Decision Making  Stable/Uncomplicated    Rehab Potential  Excellent    PT Frequency  1x / week    PT Duration  12 weeks    PT Treatment/Interventions  Biofeedback;Cryotherapy;Electrical Stimulation;Iontophoresis 4mg /ml Dexamethasone;Moist Heat;Ultrasound;Neuromuscular re-education;Therapeutic exercise;Therapeutic activities;Patient/family education;Manual techniques;Dry needling;Joint Manipulations    PT Next Visit Plan  work on  cough with pelvic floor contraction, bladder irritants, supine pelvic floor contraction with hip movements    PT Home Exercise Plan  Access Code: [email or web address]    Consulted and Agree with Plan of Care  Patient       Patient will  benefit from skilled therapeutic intervention in order to improve the following deficits and impairments:  Decreased coordination, Decreased range of motion, Increased fascial restricitons, Pain, Decreased strength  Visit Diagnosis: Muscle weakness (generalized)  Other lack of coordination  Cramp and spasm     Problem List Patient Active Problem List   Diagnosis Date Noted  . Abnormal uterine bleeding (AUB) 02/17/2020  . Groin pain, chronic, left 09/17/2019  . Ear fullness 09/17/2019  . Desire for pregnancy 09/17/2019  . Pelvic pressure in female 07/22/2019  . Frequent urination 06/24/2019  . Left arm pain 06/02/2019  . Vaginal discharge 03/26/2019  . MDD (major depressive disorder), severe ([place]) 11/09/2017  . Impulse control disorder 11/09/2017  . Epistaxis 11/24/2015  . Allergic rhinitis 05/08/2014  . Obesity (BMI 30-39.9) 11/04/2013    Earlie Counts, PT 04/05/20 11:00 AM   [facility] [address] [place], [place], [address] Phone: [phone]   Fax:  [phone]  Name: ESTELLINE DEBARGE MRN: [id number] Date of Birth: [birth year]

## 2020-04-12 ENCOUNTER — Ambulatory Visit: Payer: BC Managed Care – PPO | Admitting: Physical Therapy

## 2020-04-12 ENCOUNTER — Encounter: Payer: Self-pay | Admitting: Physical Therapy

## 2020-04-12 ENCOUNTER — Other Ambulatory Visit: Payer: Self-pay

## 2020-04-12 DIAGNOSIS — R278 Other lack of coordination: Secondary | ICD-10-CM | POA: Diagnosis not present

## 2020-04-12 DIAGNOSIS — M6281 Muscle weakness (generalized): Secondary | ICD-10-CM | POA: Diagnosis not present

## 2020-04-12 DIAGNOSIS — R252 Cramp and spasm: Secondary | ICD-10-CM | POA: Diagnosis not present

## 2020-04-12 NOTE — Therapy (Signed)
[facility] [address] [place], [place], [address] Phone: [phone]   Fax:  [phone]  Physical Therapy Treatment  Patient Details  Name: Christy Barnes MRN: [id number] Date of Birth: 09/12/1996 Referring Provider (PT): Dr. Sherren Mocha McDiarmid   Encounter Date: 04/12/2020  PT End of Session - 04/12/20 1057    Visit Number  6    Date for PT Re-Evaluation  05/17/20    Authorization Type  BCBS    PT Start Time  [id number]   came late   PT Stop Time  1059    PT Time Calculation (min)  38 min    Activity Tolerance  Patient tolerated treatment well;No increased pain    Behavior During Therapy  WFL for tasks assessed/performed       Past Medical History:  Diagnosis Date  . Labial tear freshman year  . Overweight and obesity(278.0)   . Sickle cell trait ([place]) 06/07/2017    Past Surgical History:  Procedure Laterality Date  . labia repair  freshman year    There were no vitals filed for this visit.  Subjective Assessment - 04/12/20 1021    Subjective  My groin hurst bilaterally this weekend. The leakage is better. The leakage is 6% better. I understand what irritates the bladder.    Patient Stated Goals  strengthen the pelvic floor and left hip    Currently in Pain?  Yes    Pain Score  5     Pain Location  Groin    Pain Orientation  Right;Left    Pain Descriptors / Indicators  Dull;Sharp;Spasm    Pain Type  Chronic pain    Pain Onset  More than a month ago    Pain Frequency  Constant    Aggravating Factors   squats, stand on the left leg and left right with boxing    Pain Relieving Factors  being active    Multiple Pain Sites  No         [facility] PT Assessment - 04/12/20 0001      Assessment   Medical Diagnosis  R35.0 Frequent urination    Referring Provider (PT)  Dr. Sherren Mocha McDiarmid    Onset Date/Surgical Date  11/21/19      Palpation   SI assessment   ASIS are equal                     [facility] Adult PT Treatment/Exercise - 04/12/20 0001      Neuro Re-ed    Neuro Re-ed Details   supine breathing into abdomen directed to the pelvic floor to open the pelvic floor with tactile cues, sitting on ball and opeing the pelvic floor to relax then on the mat      Lumbar Exercises: Stretches   Double Knee to Chest Stretch  1 rep;60 seconds      Lumbar Exercises: Standing   Functional Squats  15 reps    Functional Squats Limitations  tried without holding onto something, VC and tactile cues to post. tilt the pelvis,       Lumbar Exercises: Supine   Other Supine Lumbar Exercises  feet on ball and rock side to side to relax the pelvic floor,       Lumbar Exercises: Quadruped   Other Quadruped Lumbar Exercises  Quadruped hip hinge with 1/2 pummel on back to keep spinal neutral and VC to flatten back      Manual Therapy   Manual  Therapy  Soft tissue mobilization    Soft tissue mobilization  bilateral hip flexors then stretch in supine into extension             PT Education - 04/12/20 1056    Education Details  squatting keeping spinal neutral; hip hinge keeping spinal neutral; breathing to relax the pelvic floor    Person(s) Educated  Patient    Methods  Explanation;Demonstration;Verbal cues   took video on her phone   Comprehension  Verbalized understanding;Returned demonstration       PT Short Term Goals - 04/12/20 1101      PT SHORT TERM GOAL #2   Title  understand how to delay the urge to void so her bladder could full more    Time  4    Period  Weeks    Status  Achieved      PT SHORT TERM GOAL #3   Title  pelvis in correct alignment so her left hip pain decreased >/= 25%    Time  4    Period  Weeks    Status  Achieved        PT Long Term Goals - 04/05/20 1059      PT LONG TERM GOAL #1   Title  independent with advanced HEP    Time  12    Period  Weeks    Status  On-going      PT LONG TERM GOAL #2   Title  able to  fully empty her bladder with 8 oz of urine or more due to not going to urinate as frequently    Time  12    Period  Weeks    Status  On-going      PT LONG TERM GOAL #3   Title  able to squat or stand on the left leg to kick in kickboxing with left hip pain decreased >/= 75%    Time  12    Period  Weeks    Status  On-going            Plan - 04/12/20 1058    Clinical Impression Statement  Patient will hyperextend her lumbar when doing her squats increasing the pressure in the anterior hips. Patient needs tactile cues when breathing to relax her pelvic floor. Patient has difficulty with feleing her pelvic floor bulge. Patient has tightness in the bilateral hip flexors. Patient reports she will leak urine 2-3 times per week when she coughs and on the way to the bathroom. Pelvis in correct alignment. Patient will benefit from skilled therapy to improve pelvic floor strength to reduce leakage and work on left hip.    Personal Factors and Comorbidities  Fitness    Examination-Activity Limitations  Toileting;Continence    Stability/Clinical Decision Making  Stable/Uncomplicated    Rehab Potential  Excellent    PT Frequency  1x / week    PT Duration  12 weeks    PT Treatment/Interventions  Biofeedback;Cryotherapy;Electrical Stimulation;Iontophoresis 4mg /ml Dexamethasone;Moist Heat;Ultrasound;Neuromuscular re-education;Therapeutic exercise;Therapeutic activities;Patient/family education;Manual techniques;Dry needling;Joint Manipulations    PT Next Visit Plan  work on cough with pelvic floor contraction, relaxing the pelvic floor, work on squats and spinal neutral, hip flexor tightness    PT Home Exercise Plan  Access Code: [email or web address]       Patient will benefit from skilled therapeutic intervention in order to improve the following deficits and impairments:  Decreased coordination, Decreased range of motion, Increased fascial restricitons, Pain, Decreased strength  Visit Diagnosis: Muscle  weakness (generalized)  Other lack of coordination  Cramp and spasm     Problem List Patient Active Problem List   Diagnosis Date Noted  . Abnormal uterine bleeding (AUB) 02/17/2020  . Groin pain, chronic, left 09/17/2019  . Ear fullness 09/17/2019  . Desire for pregnancy 09/17/2019  . Pelvic pressure in female 07/22/2019  . Frequent urination 06/24/2019  . Left arm pain 06/02/2019  . Vaginal discharge 03/26/2019  . MDD (major depressive disorder), severe ([place]) 11/09/2017  . Impulse control disorder 11/09/2017  . Epistaxis 11/24/2015  . Allergic rhinitis 05/08/2014  . Obesity (BMI 30-39.9) 11/04/2013    Earlie Counts, PT 04/12/20 11:02 AM   [facility] [address], [place], [place], [address] Phone: [phone]   Fax:  [phone]  Name: Christy Barnes MRN: [id number] Date of Birth: 10/26/1996

## 2020-04-13 ENCOUNTER — Other Ambulatory Visit: Payer: Self-pay | Admitting: Family Medicine

## 2020-04-13 MED ORDER — OXYMETAZOLINE HCL 0.05 % NA SOLN: 1.0000 | Freq: Two times a day (BID) | NASAL | 0 refills | Status: DC

## 2020-04-21 ENCOUNTER — Encounter: Payer: Self-pay | Admitting: Family Medicine

## 2020-04-21 ENCOUNTER — Other Ambulatory Visit (HOSPITAL_COMMUNITY)
Admission: RE | Admit: 2020-04-21 | Discharge: 2020-04-21 | Disposition: A | Discharge status: Home / Self Care | Payer: BC Managed Care – PPO | Source: Ambulatory Visit | Attending: Family Medicine | Admitting: Family Medicine

## 2020-04-21 ENCOUNTER — Ambulatory Visit (INDEPENDENT_AMBULATORY_CARE_PROVIDER_SITE_OTHER): Payer: BC Managed Care – PPO | Admitting: Family Medicine

## 2020-04-21 ENCOUNTER — Other Ambulatory Visit: Payer: Self-pay

## 2020-04-21 VITALS — BP 100/60 | HR 86 | Ht 64.0 in | Wt 236.4 lb

## 2020-04-21 DIAGNOSIS — Z202 Contact with and (suspected) exposure to infections with a predominantly sexual mode of transmission: Secondary | ICD-10-CM | POA: Insufficient documentation

## 2020-04-21 DIAGNOSIS — R8290 Unspecified abnormal findings in urine: Secondary | ICD-10-CM

## 2020-04-21 DIAGNOSIS — Z114 Encounter for screening for human immunodeficiency virus [HIV]: Secondary | ICD-10-CM

## 2020-04-21 DIAGNOSIS — N898 Other specified noninflammatory disorders of vagina: Secondary | ICD-10-CM | POA: Insufficient documentation

## 2020-04-21 DIAGNOSIS — Z124 Encounter for screening for malignant neoplasm of cervix: Secondary | ICD-10-CM

## 2020-04-21 LAB — POCT URINALYSIS DIP (MANUAL ENTRY)
Bilirubin, UA: NEGATIVE
Blood, UA: NEGATIVE
Glucose, UA: NEGATIVE mg/dL
Ketones, POC UA: NEGATIVE mg/dL
Leukocytes, UA: NEGATIVE
Nitrite, UA: NEGATIVE
Protein Ur, POC: NEGATIVE mg/dL
Spec Grav, UA: 1.02 (ref 1.010–1.025)
Urobilinogen, UA: 0.2 E.U./dL
pH, UA: 8 (ref 5.0–8.0)

## 2020-04-21 LAB — POCT WET PREP (WET MOUNT)
Clue Cells Wet Prep Whiff POC: NEGATIVE
Trichomonas Wet Prep HPF POC: ABSENT

## 2020-04-21 NOTE — Patient Instructions (Addendum)
It was great to see you!  Our plans for today:  - We are checking some labs today, we will release these results to your MyChart.  - Make sure to wear condoms every time you have sex to prevent unplanned pregnancy and sexually transmitted infections.  - Take prenatal vitamins if you plan to become pregnant. Let us know if you change your mind about birth control.   Take care and seek immediate care sooner if you develop any concerns.   Dr. Johns[facility]

## 2020-04-21 NOTE — Progress Notes (Signed)
    SUBJECTIVE:   CHIEF COMPLAINT / HPI:   VAGINAL DISCHARGE  Having fishy odor and vaginal discharge for 1 day after having sex this weekend. Discharge consistency: thick Discharge color: white Medications tried: no  Recent antibiotic use: no Sex in last month: yes Possible STD exposure: yes. Does not wear condoms.  Symptoms Fever: no Dysuria: no Vaginal bleeding: no Abdomen or Pelvic pain: slight pelvic pain, has prior h/o groin pain Back pain: no Genital sores or ulcers: no Rash: no Pain during sex: no Missed menstrual period: no, 04/06/20  Due for pap smear next month, prior normal 05/2017.  PERTINENT  PMH / PSH: allergic rhinitis, abnormal uterine bleeding, obesity, depression  OBJECTIVE:   BP 100/60   Pulse 86   Ht 5\' 4"  (1.626 m)   Wt 236 lb 6 oz (107.2 kg)   SpO2 99%   BMI 40.57 kg/m   Gen: well appearing, in NAD GYN:  External genitalia within normal limits.  Vaginal mucosa pink, moist, normal rugae.  Nonfriable cervix with nabothian cyst, minimal discharge bleeding noted on speculum exam.  No bleeding. Bimanual exam revealed normal, nongravid uterus.  No cervical motion tenderness. No adnexal masses bilaterally.    ASSESSMENT/PLAN:   Vaginal discharge Exam wnl. Wet prep negative. Also obtained GC/CT in addition to HIV, RPR at patient request. Discussed safe sex practices, patient does not want contraception, verbalized risk of unplanned pregnancy. Also updated pap smear, will call with results.    Rory Percy, [place]

## 2020-04-22 LAB — CYTOLOGY - PAP
Chlamydia: NEGATIVE
Comment: NEGATIVE
Comment: NEGATIVE
Comment: NORMAL
Diagnosis: NEGATIVE
Neisseria Gonorrhea: NEGATIVE
Trichomonas: NEGATIVE

## 2020-04-22 LAB — RPR: RPR Ser Ql: NONREACTIVE

## 2020-04-22 LAB — HIV ANTIBODY (ROUTINE TESTING W REFLEX): HIV Screen 4th Generation wRfx: NONREACTIVE

## 2020-04-22 NOTE — Assessment & Plan Note (Signed)
Exam wnl. Wet prep negative. Also obtained GC/CT in addition to HIV, RPR at patient request. Discussed safe sex practices, patient does not want contraception, verbalized risk of unplanned pregnancy. Also updated pap smear, will call with results.

## 2020-04-29 ENCOUNTER — Ambulatory Visit: Payer: BC Managed Care – PPO | Admitting: Physical Therapy

## 2020-05-13 ENCOUNTER — Ambulatory Visit: Payer: BC Managed Care – PPO | Attending: Family Medicine | Admitting: Physical Therapy

## 2020-05-13 ENCOUNTER — Encounter: Payer: Self-pay | Admitting: Physical Therapy

## 2020-05-13 ENCOUNTER — Other Ambulatory Visit: Payer: Self-pay

## 2020-05-13 DIAGNOSIS — R252 Cramp and spasm: Secondary | ICD-10-CM

## 2020-05-13 DIAGNOSIS — M6281 Muscle weakness (generalized): Secondary | ICD-10-CM | POA: Diagnosis not present

## 2020-05-13 DIAGNOSIS — R278 Other lack of coordination: Secondary | ICD-10-CM

## 2020-05-13 NOTE — Therapy (Signed)
[facility] [address] [place], [place], [address] Phone: [phone]   Fax:  [phone]  Physical Therapy Treatment  Patient Details  Name: Christy Barnes MRN: [id number] Date of Birth: Nov 17, 1996 Referring Provider (PT): Dr. Sherren Mocha McDiarmid   Encounter Date: 05/13/2020   PT End of Session - 05/13/20 0906    Visit Number 7    Date for PT Re-Evaluation 05/17/20    Authorization Type BCBS    PT Start Time 0906   came late   PT Stop Time 0930    PT Time Calculation (min) 24 min    Activity Tolerance Patient tolerated treatment well;No increased pain    Behavior During Therapy WFL for tasks assessed/performed           Past Medical History:  Diagnosis Date  . Labial tear freshman year  . Overweight and obesity(278.0)   . Sickle cell trait ([place]) 06/07/2017    Past Surgical History:  Procedure Laterality Date  . labia repair  freshman year    There were no vitals filed for this visit.   Subjective Assessment - 05/13/20 0907    Subjective I am doing 50 -100 squats per day. I am ready for discharge.    Patient Stated Goals strengthen the pelvic floor and left hip    Currently in Pain? No/denies              [facility] PT Assessment - 05/13/20 0001      Assessment   Medical Diagnosis R35.0 Frequent urination    Referring Provider (PT) Dr. Sherren Mocha McDiarmid    Onset Date/Surgical Date 11/21/19      Prior Function   Level of Independence Independent    Vocation Full time employment    Vocation Requirements sitting , sitting       Cognition   Overall Cognitive Status Within Functional Limits for tasks assessed      ROM / Strength   AROM / PROM / Strength AROM;PROM;Strength      Strength   Right Hip External Rotation  5/5    Right Hip Internal Rotation 5/5    Right Hip ABduction 5/5    Left Hip External Rotation 5/5    Left Hip Internal Rotation 5/5    Left Hip ABduction 5/5      Palpation    SI assessment  ASIS are equal                      Pelvic Floor Special Questions - 05/13/20 0001    Pelvic Floor Internal Exam Patient confirms identification and approves PT to assess pelvic floor    Exam Type Vaginal    Palpation slight tenderness located in the posterior introitus    Strength good squeeze, good lift, able to hold agaisnt strong resistance    Strength # of seconds 10             [facility] Adult PT Treatment/Exercise - 05/13/20 0001      Lumbar Exercises: Seated   Other Seated Lumbar Exercises seated pelvic floor contraction holding for 10 sec 5 times      Lumbar Exercises: Supine   Bent Knee Raise 20 reps;1 second      Lumbar Exercises: Quadruped   Plank PLANK on elbows 15 sec 4 times with good core engagement      Knee/Hip Exercises: Sidelying   Other Sidelying Knee/Hip Exercises side plank hold 15 sec 3 times each side  Manual Therapy   Manual Therapy Internal Pelvic Floor    Manual therapy comments reviewed with patient on performing posterior intoitus soft tissue work     Internal Pelvic Floor gentle soft tissue work to posterior introitus                  PT Education - 05/13/20 0928    Education Details Access Code: [email or web address]    Person(s) Educated Patient    Methods Explanation;Demonstration;Verbal cues;Handout    Comprehension Returned demonstration;Verbalized understanding            PT Short Term Goals - 04/12/20 1101      PT SHORT TERM GOAL #2   Title understand how to delay the urge to void so her bladder could full more    Time 4    Period Weeks    Status Achieved      PT SHORT TERM GOAL #3   Title pelvis in correct alignment so her left hip pain decreased >/= 25%    Time 4    Period Weeks    Status Achieved             PT Long Term Goals - 05/13/20 0907      PT LONG TERM GOAL #1   Title independent with advanced HEP    Time 12    Period Weeks    Status Achieved      PT LONG TERM GOAL #2    Title able to fully empty her bladder with 8 oz of urine or more due to not going to urinate as frequently    Time 12    Period Weeks    Status Achieved      PT LONG TERM GOAL #3   Title able to squat or stand on the left leg to kick in kickboxing with left hip pain decreased >/= 75%    Time 12    Period Weeks    Status Achieved      PT LONG TERM GOAL #4   Title pelvic floor strength >/= 3/5 holding for 10 seconds so her urinary leakage decreased >/= 75%    Time 12    Period Weeks    Status Achieved                 Plan - 05/13/20 0911    Clinical Impression Statement Patient has met her goals. She has increased strength of her hips. Patient pelvic floor strength has increased to 4/5 and holding for 10 seconds. Patient has some tenderness located on the posterior aspect of the vaginal canal and understands how to perfrom soft tissue work. Patient reports urinary leakage is 75% better. She is doing 50-100 squats per day. Patient is independent with her advanced HEP. Patient is ready for discharge.    Personal Factors and Comorbidities Fitness    Examination-Activity Limitations Toileting;Continence    Stability/Clinical Decision Making Stable/Uncomplicated    Rehab Potential Excellent    PT Treatment/Interventions Biofeedback;Cryotherapy;Electrical Stimulation;Iontophoresis 4mg/ml Dexamethasone;Moist Heat;Ultrasound;Neuromuscular re-education;Therapeutic exercise;Therapeutic activities;Patient/family education;Manual techniques;Dry needling;Joint Manipulations    PT Next Visit Plan discharge to HEP    PT Home Exercise Plan Access Code: [email or web address]    Consulted and Agree with Plan of Care Patient           Patient will benefit from skilled therapeutic intervention in order to improve the following deficits and impairments:  Decreased coordination, Decreased range of motion, Increased fascial restricitons, Pain, Decreased strength  Visit Diagnosis: Muscle weakness (generalized)     Other lack of coordination  Cramp and spasm     Problem List Patient Active Problem List   Diagnosis Date Noted  . Abnormal uterine bleeding (AUB) 02/17/2020  . Groin pain, chronic, left 09/17/2019  . Ear fullness 09/17/2019  . Desire for pregnancy 09/17/2019  . Pelvic pressure in female 07/22/2019  . Frequent urination 06/24/2019  . Left arm pain 06/02/2019  . Vaginal discharge 03/26/2019  . MDD (major depressive disorder), severe ([facility]) 11/09/2017  . Impulse control disorder 11/09/2017  . Epistaxis 11/24/2015  . Allergic rhinitis 05/08/2014  . Obesity (BMI 30-39.9) 11/04/2013    Cheryl Gray, PT 05/13/20 9:32 AM    [facility] [address], [address] [place], [place], [address] Phone: [phone]   Fax:  [phone]  Name: Ailanie F Bail MRN: [id number] Date of Birth: 01/25/1996  PHYSICAL THERAPY DISCHARGE SUMMARY  Visits from Start of Care: 7  Current functional level related to goals / functional outcomes: See above.    Remaining deficits: See above   Education / Equipment: HEP Plan: Patient agrees to discharge.  Patient goals were met. Patient is being discharged due to meeting the stated rehab goals.  Thank you for the referral. Cheryl Gray, PT 05/13/20 9:33 AM  ?????      

## 2020-05-13 NOTE — Patient Instructions (Signed)
Access Code: [email or web address] URL: [email or web address] Date: 05/13/2020 Prepared by: Earlie Counts  Exercises Seated Piriformis Stretch with Trunk Bend - 1 x daily - 7 x weekly - 1 sets - 2 reps - 30 sec hold Seated Hamstring Stretch - 1 x daily - 7 x weekly - 1 sets - 2 reps - 30 sec hold Supine Pelvic Floor Stretch - 1 x daily - 7 x weekly - 1 sets - 1 reps - 1 min hold Supine Lumbar Rotation Stretch - 1 x daily - 7 x weekly - 1 sets - 2 reps - 30 sec hold Cat-Camel - 1 x daily - 7 x weekly - 1 sets - 10 reps Seated Pelvic Floor Contraction - 3 x daily - 7 x weekly - 1 sets - 5 reps - 10 sec hold hold Supine March - 1 x daily - 7 x weekly - 2 sets - 10 reps Standard Plank - 1 x daily - 7 x weekly - 1 sets - 3 reps - 15 hold Side Plank on Knees - 1 x daily - 7 x weekly - 1 sets - 3 reps - 15 sec hold  Patient Education Trigger [facility] [address], [place], [place] [address] Phone # [phone] Fax [phone]

## 2020-06-04 ENCOUNTER — Encounter: Payer: Self-pay | Admitting: Family Medicine

## 2020-06-08 ENCOUNTER — Other Ambulatory Visit: Payer: Self-pay

## 2020-06-08 ENCOUNTER — Ambulatory Visit: Payer: BC Managed Care – PPO

## 2020-06-08 ENCOUNTER — Ambulatory Visit (INDEPENDENT_AMBULATORY_CARE_PROVIDER_SITE_OTHER): Payer: BC Managed Care – PPO | Admitting: Family Medicine

## 2020-06-08 DIAGNOSIS — L700 Acne vulgaris: Secondary | ICD-10-CM | POA: Diagnosis not present

## 2020-06-08 MED ORDER — ADAPALENE-BENZOYL PEROXIDE 0.1-2.5 % EX GEL: 45.0000 g | Freq: Every day | CUTANEOUS | 0 refills | Status: DC

## 2020-06-08 MED ORDER — FLUTICASONE PROPIONATE 50 MCG/ACT NA SUSP: 2.0000 | Freq: Every day | NASAL | 6 refills | Status: DC

## 2020-06-08 NOTE — Patient Instructions (Addendum)
Acne  Acne is a skin problem that causes small, red bumps (pimples) and other skin changes. The skin has tiny holes called pores. Each pore has an oil gland. Acne happens when the pores get blocked. The pores may become red, sore, and swollen. They may also become infected. Acne is common among teenagers. Acne usually goes away over time. What are the causes? This condition may be caused when:  Oil glands get blocked by oil, dead skin cells, and dirt.  Bacteria that live in the oil glands increase in number and cause infection. Acne can start with changes in hormones. These changes can occur:  When children mature into their teens (adolescence).  When women get their period (menstrual cycle).  When women are pregnant. Some things can make acne worse. They include:  Cosmetics and hair products that have oil in them.  Stress.  Diseases that cause changes in hormones.  Some medicines.  Headbands, backpacks, or shoulder pads.  Being near certain oils and chemicals.  Foods that are high in sugars. These include dairy products, sweets, and chocolates. What increases the risk? You are more likely to develop this condition if:  You are a teenager.  You have a family history of acne. What are the signs or symptoms? Symptoms of this condition include:  Small, red bumps (pimples or papules).  Whiteheads.  Blackheads.  Small, pus-filled pimples (pustules).  Big, red pimples or pustules that feel tender. Acne that is very bad can cause:  An abscess. This is an area that has pus.  Cysts. These are hard, painful sacs that have fluid.  Scars. These can happen after large pimples heal. How is this treated? Treatment for this condition depends on how bad your acne is. It may include:  Creams and lotions. These can: ? Keep the pores of your skin open. ? Prevent infections and swelling.  Medicines that treat infections (antibiotics). These can be put on your skin or taken  as pills.  Pills that decrease the amount of oil in your skin.  Birth control pills.  Light or laser treatments.  Shots of medicine into the areas with acne.  Chemicals that cause the skin to peel.  Surgery. Follow these instructions at home: Good skin care is the most important thing you can do to treat your acne. Take care of your skin as told by your doctor. You may be told to do these things:  Wash your skin gently at least two times each day. You should also wash your skin: ? After you exercise. ? Before you go to bed.  Use mild soap.  Use a water-based skin moisturizer after you wash your skin.  Use a sunscreen or sunblock with SPF 30 or greater. This is very important if you are using acne medicines.  Choose cosmetics that will not block your oil glands (are noncomedogenic). Medicines  Take over-the-counter and prescription medicines only as told by your doctor.  If you were prescribed an antibiotic medicine, use it or take it as told by your doctor. Do not stop using the antibiotic even if your acne gets better. General instructions  Keep your hair clean and off your face. Shampoo your hair on a regular basis. If you have oily hair, you may need to wash it every day.  Avoid wearing tight headbands or hats.  Avoid picking or squeezing your pimples. That can make your acne worse and cause it to scar.  Shave gently. Only shave when you have to.    Keep a food journal. This can help you see if any foods are linked to your acne.  Keep all follow-up visits as told by your doctor. This is important. Contact a doctor if:  Your acne is not better after eight weeks.  Your acne gets worse.  You have a large area of skin that is red or tender.  You think that you are having side effects from any acne medicine. Summary  Acne is a skin problem that causes pimples. Acne is common among teenagers. Acne usually goes away over time.  Acne starts with changes in your  hormones. Other causes include stress, diet, and some medicines.  Follow your doctor's instructions on how to take care of your skin. Good skin care is the most important thing you can do to treat your acne.  Take over-the-counter and prescription medicines only as told by your doctor.  Contact your doctor if you think that you are having side effects from any acne medicine. This information is not intended to replace advice given to you by your health care provider. Make sure you discuss any questions you have with your health care provider. Document Revised: 03/19/2018 Document Reviewed: 03/19/2018 Elsevier Patient Education  [place].   Adapalene; Benzoyl Peroxide topical gel What is this medicine? ADAPALENE; BENZOYL PEROXIDE (a DAP a leen; BEN zoe ill per OX ide) is used on the skin to treat acne. This medicine may be used for other purposes; ask your health care provider or pharmacist if you have questions. COMMON BRAND NAME(S): Epiduo What should I tell my health care provider before I take this medicine? They need to know if you have any of these conditions:  eczema  seborrheic dermatitis  skin abrasions  sunburn  an unusual or allergic reaction to adapalene, benzoyl peroxide, vitamin A, other medicines, foods, dyes, or preservatives  pregnant or trying to get pregnant  breast-feeding How should I use this medicine? This medicine is for external use only. Follow the directions on the prescription label. Cleanse the affected area with a mild or soapless cleanser and pat dry. Apply a thin layer of medicine to the affected area. Rub in gently. Do not get in the eyes, on the lips, or on any other areas of sensitive skin. Use your medicine at regular intervals. Do not use it more often than directed. Talk to your pediatrician regarding the use of this medicine in children. Special care may be needed. Overdosage: If you think you have taken too much of this medicine  contact a poison control center or emergency room at once. NOTE: This medicine is only for you. Do not share this medicine with others. What if I miss a dose? If you miss a dose, use it as soon as you can. If it is almost time for your next dose, use only that dose. Do not use double or extra doses. What may interact with this medicine?  other acne medicines  salicylic acid or sulfur containing products  topical antibiotics like clindamycin or erythromycin This list may not describe all possible interactions. Give your health care provider a list of all the medicines, herbs, non-prescription drugs, or dietary supplements you use. Also tell them if you smoke, drink alcohol, or use illegal drugs. Some items may interact with your medicine.  What should I watch for while using this medicine? Your acne may get worse at first, and then should start to get better. It may take 2 to 12 weeks before you see  the full effect. Do not use products that may dry the skin like medicated cosmetics, products that contain alcohol, astringents, spices, limes, or abrasive soaps or cleaners. Do not use other acne or skin treatments on the same area that you use this medicine unless your doctor or health care professional tells you to. If you use these together they can cause severe skin irritation. This medicine can make you more sensitive to the sun. Keep out of the sun. If you cannot avoid being in the sun, wear protective clothing and use sunscreen. Do not use sun lamps or tanning beds/booths. This medicine may bleach hair or colored fabrics. Avoid getting the medicine on your clothes.  What side effects may I notice from receiving this medicine? Side effects that you should report to your doctor or health care professional as soon as possible:  allergic reactions like skin rash, itching or hives, swelling of the face, lips, or tongue  severe burning, redness, crusting, or swelling of the treated areas Side  effects that usually do not require medical attention (report to your doctor or health care professional if they continue or are bothersome):  increased sensitivity to the sun  inflamed, stinging, and irritated skin  skin that peels after a few days of use This list may not describe all possible side effects. Call your doctor for medical advice about side effects. You may report side effects to FDA at 1-800-FDA-1088.  Where should I keep my medicine? Keep out of the reach of children. Store at room temperature between 15 and 30 degrees C (59 and 86 degrees F). Protect from light. Keep container tightly closed. Throw away any unused medication after the expiration date. NOTE: This sheet is a summary. It may not cover all possible information. If you have questions about this medicine, talk to your doctor, pharmacist, or health care provider.  2020 Elsevier/Gold Standard (2008-02-04 17:41:57)

## 2020-06-08 NOTE — Progress Notes (Signed)
    SUBJECTIVE:   CHIEF COMPLAINT / HPI:   Christy Barnes is a 24 yr old female who presents today for acne  Acne vulgaris  Patient endorses acne since her teenage years.  She usually gets pustules on her chin, forehead and over her lips.  They do leave hypopigmentation scars which is bothering her.  She is tired of dealing with the acne the moment she uses a cleanser from dermatologic and " natural" skin products including a papaya serum and food cleanser she washes her face 3 times a day.  Her acne improves with aloe vera gel and worsens with stress and eating knots.  She has tried topical agents in the past when she was younger but nothing recently.  PERTINENT  PMH / PSH: Acne  OBJECTIVE:   BP 94/62   Pulse 85   Ht 5\' 4"  (1.626 m)   Wt 231 lb 9.6 oz (105.1 kg)   LMP 05/26/2020 (Exact Date)   SpO2 98%   BMI 39.75 kg/m        inflammatory pustule and papules noted over forehead, chin and upper lip.  ASSESSMENT/PLAN:   Acne vulgaris On exam today inflammatory pustules and papules noted to the forehead, chin and upper lip.  Prescribed adapalene-benzyl peroxide ointment and advised patient to apply once daily at bedtime to affected areas.  Also recommended skin care without fragrance or alcohol as this increased the skin sensitivity.  Also recommended the patient was sunscreen daily to protect skin from sun damage.  Follow-up with PCP or myself.  If acne remains uncontrolled would recommend dermatology referral provided patient with handout on acne and adapalene-peroxide ointment.     Lattie Haw, MD [place]

## 2020-06-08 NOTE — Assessment & Plan Note (Addendum)
On exam today inflammatory pustules and papules noted to the forehead, chin and upper lip.  Prescribed adapalene-benzyl peroxide ointment and advised patient to apply once daily at bedtime to affected areas.  Also recommended skin care without fragrance or alcohol as this increased the skin sensitivity.  Also recommended the patient was sunscreen daily to protect skin from sun damage.  Follow-up with PCP or myself.  If acne remains uncontrolled would recommend dermatology referral provided patient with handout on acne and adapalene-peroxide ointment.

## 2020-06-18 ENCOUNTER — Ambulatory Visit: Payer: BC Managed Care – PPO | Admitting: Family Medicine

## 2020-06-29 NOTE — Patient Instructions (Addendum)
It was nice meeting you today!  Start Melatonin 5 mg nightly Practice good sleep hygiene  Pregnancy test is negative I recommend considering birth control to avoid unwanted pregnancy and use of condoms to prevent against STI.  I have prescribed Folic Acid 1 mg daily  If you have any questions or concerns, please feel free to call the clinic.   Be well,  Carollee Leitz, MD [facility]     Insomnia Insomnia is a sleep disorder that makes it difficult to fall asleep or stay asleep. Insomnia can cause fatigue, low energy, difficulty concentrating, mood swings, and poor performance at work or school. There are three different ways to classify insomnia:  Difficulty falling asleep.  Difficulty staying asleep.  Waking up too early in the morning. Any type of insomnia can be long-term (chronic) or short-term (acute). Both are common. Short-term insomnia usually lasts for three months or less. Chronic insomnia occurs at least three times a week for longer than three months. What are the causes? Insomnia may be caused by another condition, situation, or substance, such as:  Anxiety.  Certain medicines.  Gastroesophageal reflux disease (GERD) or other gastrointestinal conditions.  Asthma or other breathing conditions.  Restless legs syndrome, sleep apnea, or other sleep disorders.  Chronic pain.  Menopause.  Stroke.  Abuse of alcohol, tobacco, or illegal drugs.  Mental health conditions, such as depression.  Caffeine.  Neurological disorders, such as Alzheimer's disease.  An overactive thyroid (hyperthyroidism). Sometimes, the cause of insomnia may not be known. What increases the risk? Risk factors for insomnia include:  Gender. Women are affected more often than men.  Age. Insomnia is more common as you get older.  Stress.  Lack of exercise.  Irregular work schedule or working night shifts.  Traveling between different time zones.  Certain  medical and mental health conditions. What are the signs or symptoms? If you have insomnia, the main symptom is having trouble falling asleep or having trouble staying asleep. This may lead to other symptoms, such as:  Feeling fatigued or having low energy.  Feeling nervous about going to sleep.  Not feeling rested in the morning.  Having trouble concentrating.  Feeling irritable, anxious, or depressed. How is this diagnosed? This condition may be diagnosed based on:  Your symptoms and medical history. Your health care provider may ask about: ? Your sleep habits. ? Any medical conditions you have. ? Your mental health.  A physical exam. How is this treated? Treatment for insomnia depends on the cause. Treatment may focus on treating an underlying condition that is causing insomnia. Treatment may also include:  Medicines to help you sleep.  Counseling or therapy.  Lifestyle adjustments to help you sleep better. Follow these instructions at home: Eating and drinking   Limit or avoid alcohol, caffeinated beverages, and cigarettes, especially close to bedtime. These can disrupt your sleep.  Do not eat a large meal or eat spicy foods right before bedtime. This can lead to digestive discomfort that can make it hard for you to sleep. Sleep habits   Keep a sleep diary to help you and your health care provider figure out what could be causing your insomnia. Write down: ? When you sleep. ? When you wake up during the night. ? How well you sleep. ? How rested you feel the next day. ? Any side effects of medicines you are taking. ? What you eat and drink.  Make your bedroom a dark, comfortable place where it is  easy to fall asleep. ? Put up shades or blackout curtains to block light from outside. ? Use a white noise machine to block noise. ? Keep the temperature cool.  Limit screen use before bedtime. This includes: ? Watching TV. ? Using your smartphone, tablet, or  computer.  Stick to a routine that includes going to bed and waking up at the same times every day and night. This can help you fall asleep faster. Consider making a quiet activity, such as reading, part of your nighttime routine.  Try to avoid taking naps during the day so that you sleep better at night.  Get out of bed if you are still awake after 15 minutes of trying to sleep. Keep the lights down, but try reading or doing a quiet activity. When you feel sleepy, go back to bed. General instructions  Take over-the-counter and prescription medicines only as told by your health care provider.  Exercise regularly, as told by your health care provider. Avoid exercise starting several hours before bedtime.  Use relaxation techniques to manage stress. Ask your health care provider to suggest some techniques that may work well for you. These may include: ? Breathing exercises. ? Routines to release muscle tension. ? Visualizing peaceful scenes.  Make sure that you drive carefully. Avoid driving if you feel very sleepy.  Keep all follow-up visits as told by your health care provider. This is important. Contact a health care provider if:  You are tired throughout the day.  You have trouble in your daily routine due to sleepiness.  You continue to have sleep problems, or your sleep problems get worse. Get help right away if:  You have serious thoughts about hurting yourself or someone else. If you ever feel like you may hurt yourself or others, or have thoughts about taking your own life, get help right away. You can go to your nearest emergency department or call:  Your local emergency services (911 in the [place]  A suicide crisis helpline, such as the [facility] at (279)617-8976. This is open 24 hours a day. Summary  Insomnia is a sleep disorder that makes it difficult to fall asleep or stay asleep.  Insomnia can be long-term (chronic) or short-term  (acute).  Treatment for insomnia depends on the cause. Treatment may focus on treating an underlying condition that is causing insomnia.  Keep a sleep diary to help you and your health care provider figure out what could be causing your insomnia. This information is not intended to replace advice given to you by your health care provider. Make sure you discuss any questions you have with your health care provider. Document Revised: 10/19/2017 Document Reviewed: 08/16/2017 Elsevier Patient Education  2020 [employer].    Contraception Choices Contraception, also called birth control, means things to use or ways to try not to get pregnant. Hormonal birth control This kind of birth control uses hormones. Here are some types of hormonal birth control:  A tube that is put under skin of the arm (implant). The tube can stay in for as long as 3 years.  Shots to get every 3 months (injections).  Pills to take every day (birth control pills).  A patch to change 1 time each week for 3 weeks (birth control patch). After that, the patch is taken off for 1 week.  A ring to put in the vagina. The ring is left in for 3 weeks. Then it is taken out of the vagina for 1  week. Then a new ring is put in.  Pills to take after unprotected sex (emergency birth control pills). Barrier birth control Here are some types of barrier birth control:  A thin covering that is put on the penis before sex (female condom). The covering is thrown away after sex.  A soft, loose covering that is put in the vagina before sex (female condom). The covering is thrown away after sex.  A rubber bowl that sits over the cervix (diaphragm). The bowl must be made for you. The bowl is put into the vagina before sex. The bowl is left in for 6-8 hours after sex. It is taken out within 24 hours.  A small, soft cup that fits over the cervix (cervical cap). The cup must be made for you. The cup can be left in for 6-8 hours after sex.  It is taken out within 48 hours.  A sponge that is put into the vagina before sex. It must be left in for at least 6 hours after sex. It must be taken out within 30 hours. Then it is thrown away.  A chemical that kills or stops sperm from getting into the uterus (spermicide). It may be a pill, cream, jelly, or foam to put in the vagina. The chemical should be used at least 10-15 minutes before sex. IUD (intrauterine) birth control An IUD is a small, T-shaped piece of plastic. It is put inside the uterus. There are two kinds:  Hormone IUD. This kind can stay in for 3-5 years.  Copper IUD. This kind can stay in for 10 years. Permanent birth control Here are some types of permanent birth control:  Surgery to block the fallopian tubes.  Having an insert put into each fallopian tube.  Surgery to tie off the tubes that carry sperm (vasectomy). Natural planning birth control Here are some types of natural planning birth control:  Not having sex on the days the woman could get pregnant.  Using a calendar: ? To keep track of the length of each period. ? To find out what days pregnancy can happen. ? To plan to not have sex on days when pregnancy can happen.  Watching for symptoms of ovulation and not having sex during ovulation. One way the woman can check for ovulation is to check her temperature.  Waiting to have sex until after ovulation. Summary  Contraception, also called birth control, means things to use or ways to try not to get pregnant.  Hormonal methods of birth control include implants, injections, pills, patches, vaginal rings, and emergency birth control pills.  Barrier methods of birth control can include female condoms, female condoms, diaphragms, cervical caps, sponges, and spermicides.  There are two types of IUD (intrauterine device) birth control. An IUD can be put in a woman's uterus to prevent pregnancy for 3-5 years.  Permanent sterilization can be done through a  procedure for males, females, or both.  Natural planning methods involve not having sex on the days when the woman could get pregnant. This information is not intended to replace advice given to you by your health care provider. Make sure you discuss any questions you have with your health care provider. Document Revised: 02/26/2019 Document Reviewed: 11/16/2016 Elsevier Patient Education  [place].

## 2020-06-29 NOTE — Progress Notes (Signed)
    SUBJECTIVE:   CHIEF COMPLAINT / HPI: Pregnancy, STI testing and difficulty sleeping    STI check -sexually active wants to be checked for STIs. Previously negative RPR, HIV in 04/2020. No symptoms in herself or in partner. - Symptoms: Thin vaginal discharge that has since resolved - Medications tried: None - Sexually active with 1 partner(s) - Last sexual encounter: July 17 - Contraception: None Symptoms - Abnormal vaginal discharge: None - Missed period: No - Fever: No - Abdominal/Pelvic pain: No - Vaginal bleeding: No - Pain during sex: No - Rash: No   Difficulty sleeping Patient reports having difficulty sleeping for the last 2 to 3 weeks.  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  She has tried melatonin 5 mg with some help but is not consistent.  She reports that she is getting ready to start grad school and feels like she has a lot of things on her mind.  Acne vulgaris Patient requesting dermatology consult for recurrent acne and hair loss.  Was seen by Dr. Posey Pronto and treated for acne vulgaris but continues to have symptoms.  PERTINENT  PMH / PSH:  None OBJECTIVE:   BP 100/68   Pulse 88   Ht 5\' 4"  (1.626 m)   Wt 232 lb 9.6 oz (105.5 kg)   LMP 05/26/2020 (Exact Date)   SpO2 98%   BMI 39.93 kg/m    General: Alert and oriented, no apparent distress  EVE: No lesions or abrasions noted. SVE: Small amount of thin yellow discharge noted in vaginal vault, cervix appears normal with nabothian cyst at 3 o'clock position, normal vaginal mucosa Bimanual: No CMT or adnexal masses appreciated.  ASSESSMENT/PLAN:   Vaginal discharge Reports small amount of vaginal discharge for 2 to 3 days that has since resolved.  Currently sexually active with no condom use or BCP. -Urine pregnancy test negative -Wet mount shows some bacteria and clue cells -GC/C -Flagyl 500 mg twice daily Z6/1 -Folic acid 1 mg  daily -Encourage consideration of birth control methods to prevent unwanted pregnancies.  Resources provided.   Difficulty sleeping Patient reports 2 to 3-week history of not sleeping well.  Likely secondary to life event about to happen with going to grad school. -Discussed sleep hygiene -Advised melatonin nightly until able to get on good sleep hygiene -Follow-up with PCP as needed.  Acne vulgaris Patient requesting dermatology referral for acne vulgaris and hair loss.  Reports continuing even after treatment. -Dermatology referral -Follow-up with PCP.      Carollee Leitz, MD [place]

## 2020-06-30 ENCOUNTER — Other Ambulatory Visit (HOSPITAL_COMMUNITY)
Admission: RE | Admit: 2020-06-30 | Discharge: 2020-06-30 | Disposition: A | Discharge status: Home / Self Care | Payer: BC Managed Care – PPO | Source: Ambulatory Visit | Attending: Family Medicine | Admitting: Family Medicine

## 2020-06-30 ENCOUNTER — Other Ambulatory Visit: Payer: Self-pay

## 2020-06-30 ENCOUNTER — Ambulatory Visit (INDEPENDENT_AMBULATORY_CARE_PROVIDER_SITE_OTHER): Payer: BC Managed Care – PPO | Admitting: Family Medicine

## 2020-06-30 VITALS — BP 100/68 | HR 88 | Ht 64.0 in | Wt 232.6 lb

## 2020-06-30 DIAGNOSIS — N898 Other specified noninflammatory disorders of vagina: Secondary | ICD-10-CM

## 2020-06-30 DIAGNOSIS — L700 Acne vulgaris: Secondary | ICD-10-CM | POA: Diagnosis not present

## 2020-06-30 DIAGNOSIS — Z113 Encounter for screening for infections with a predominantly sexual mode of transmission: Secondary | ICD-10-CM

## 2020-06-30 DIAGNOSIS — G479 Sleep disorder, unspecified: Secondary | ICD-10-CM | POA: Diagnosis not present

## 2020-06-30 LAB — POCT WET PREP (WET MOUNT)
Clue Cells Wet Prep Whiff POC: POSITIVE
Trichomonas Wet Prep HPF POC: ABSENT

## 2020-06-30 LAB — POCT URINE PREGNANCY: Preg Test, Ur: NEGATIVE

## 2020-06-30 MED ORDER — METRONIDAZOLE 500 MG PO TABS
500.0000 mg | ORAL_TABLET | Freq: Two times a day (BID) | ORAL | 0 refills | Status: AC
Start: 2020-06-30 — End: 2020-07-07

## 2020-06-30 MED ORDER — FOLIC ACID 1 MG PO TABS
1.0000 mg | ORAL_TABLET | Freq: Every day | ORAL | 3 refills | Status: DC
Start: 2020-06-30 — End: 2021-01-28

## 2020-07-01 LAB — CERVICOVAGINAL ANCILLARY ONLY
Chlamydia: NEGATIVE
Comment: NEGATIVE
Comment: NORMAL
Neisseria Gonorrhea: NEGATIVE

## 2020-07-01 NOTE — Progress Notes (Signed)
Please call patient and let her know that I have sent a referral to dermatology.  Thank you  Lavella Lemons

## 2020-07-02 ENCOUNTER — Encounter: Payer: Self-pay | Admitting: Family Medicine

## 2020-07-02 DIAGNOSIS — G479 Sleep disorder, unspecified: Secondary | ICD-10-CM | POA: Insufficient documentation

## 2020-07-02 NOTE — Assessment & Plan Note (Signed)
Patient reports 2 to 3-week history of not sleeping well.  Likely secondary to life event about to happen with going to grad school. -Discussed sleep hygiene -Advised melatonin nightly until able to get on good sleep hygiene -Follow-up with PCP as needed.

## 2020-07-02 NOTE — Assessment & Plan Note (Signed)
Patient requesting dermatology referral for acne vulgaris and hair loss.  Reports continuing even after treatment. -Dermatology referral -Follow-up with PCP.

## 2020-07-02 NOTE — Assessment & Plan Note (Addendum)
Reports small amount of vaginal discharge for 2 to 3 days that has since resolved.  Currently sexually active with no condom use or BCP. -Urine pregnancy test negative -Wet mount shows some bacteria and clue cells -GC/C -Flagyl 500 mg twice daily Z5/0 -Folic acid 1 mg daily -Encourage consideration of birth control methods to prevent unwanted pregnancies.  Resources provided.

## 2020-07-03 IMAGING — CT CT CHEST W/ CM
2 of 5 series · 14 of 36 positions shown, 17 images · IV contrast (ISOVUE)
Comparison: Chest radiograph dated 08/10/2018

CLINICAL DATA: 22-year-old female with blunt trauma to the abdomen.

EXAM:
CT CHEST, ABDOMEN, AND PELVIS WITH CONTRAST
TECHNIQUE: Multidetector CT imaging of the chest, abdomen and pelvis was
performed following the standard protocol during bolus
administration of intravenous contrast.
CONTRAST:  100mL L2YDOH-HDD IOPAMIDOL (L2YDOH-HDD) INJECTION 61%

[Series 2: cap with · axial · 0.74mm/px · z∈[-591,-111]mm · 11 of 116 slices shown, 14 images]
[im 10/116  mediastinal]
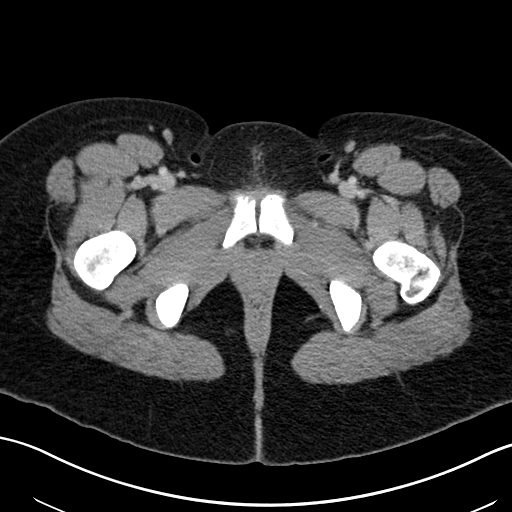
[im 10/116  lung]
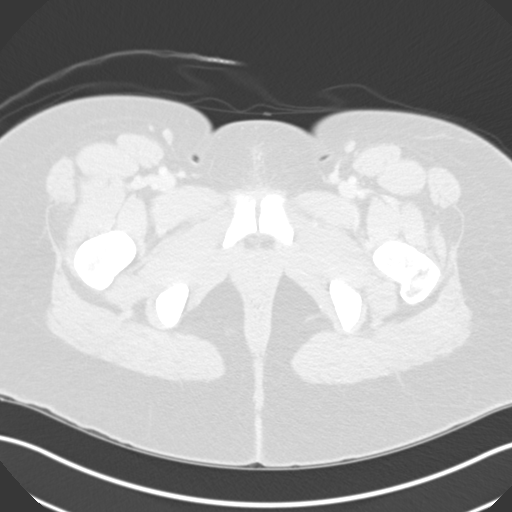
[im 20/116  lung]
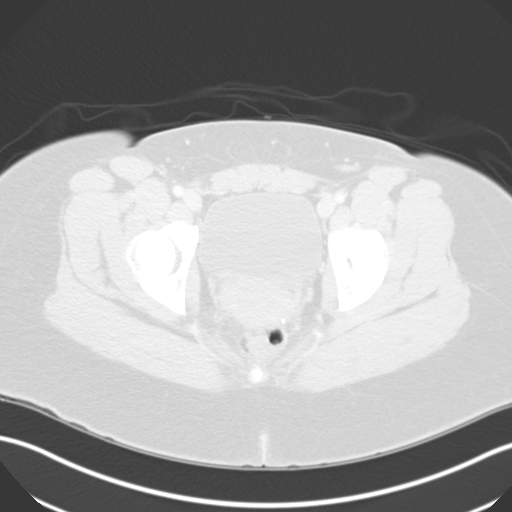
[im 29/116  lung]
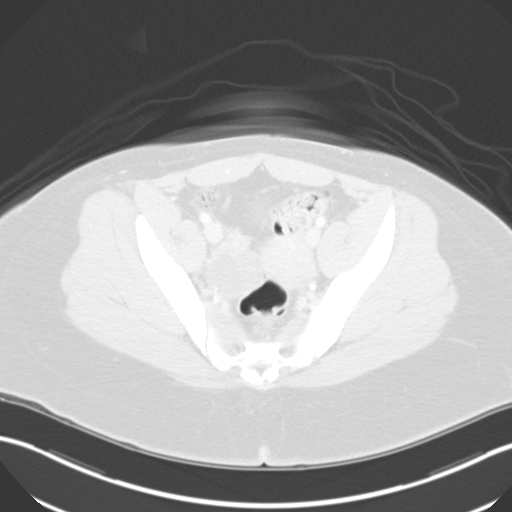
[im 39/116  lung]
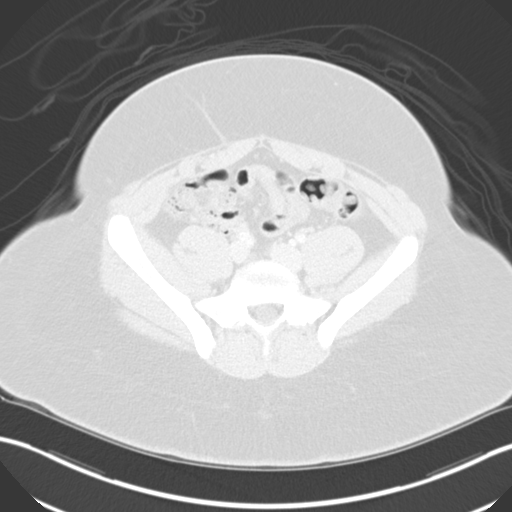
[im 48/116  mediastinal]
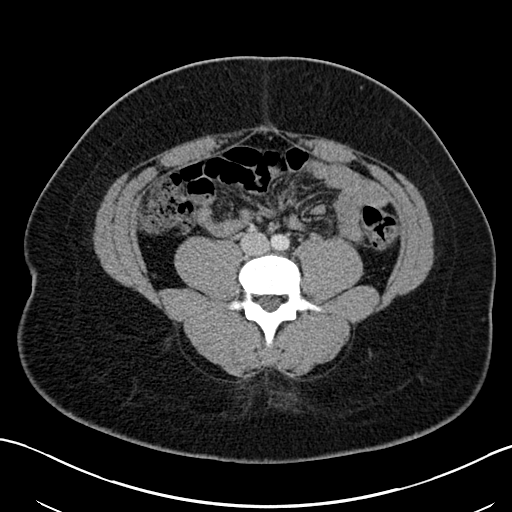
[im 48/116  lung]
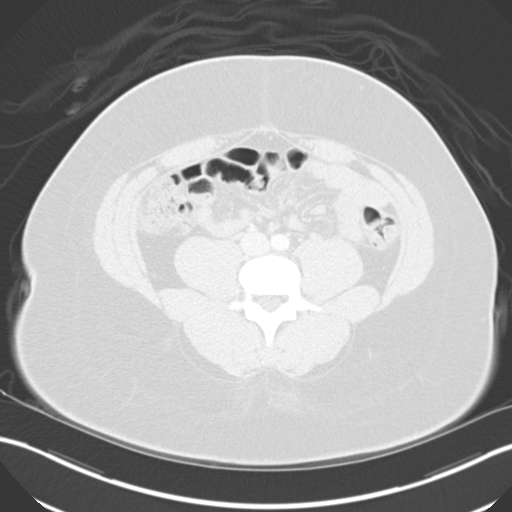
[im 58/116  lung]
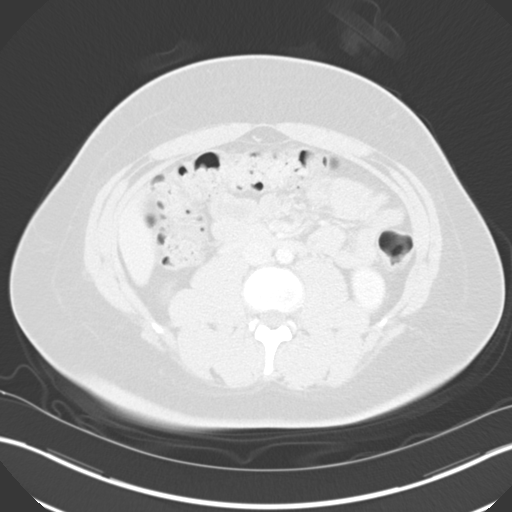
[im 68/116  lung]
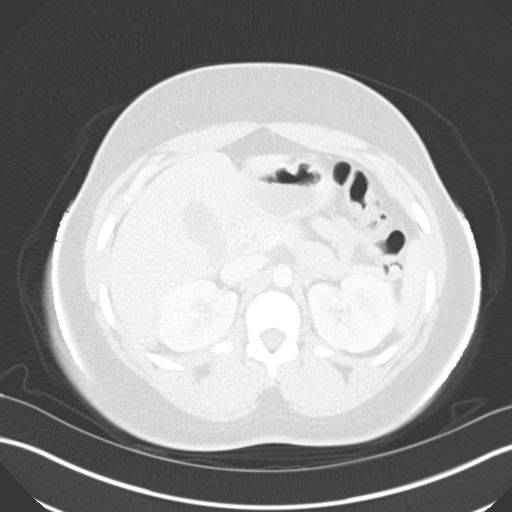
[im 77/116  lung]
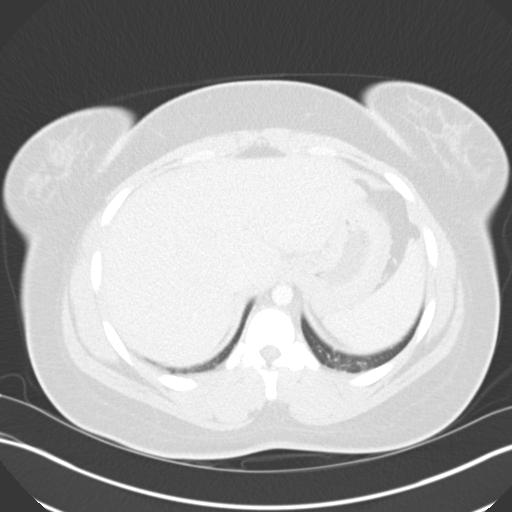
[im 87/116  mediastinal]
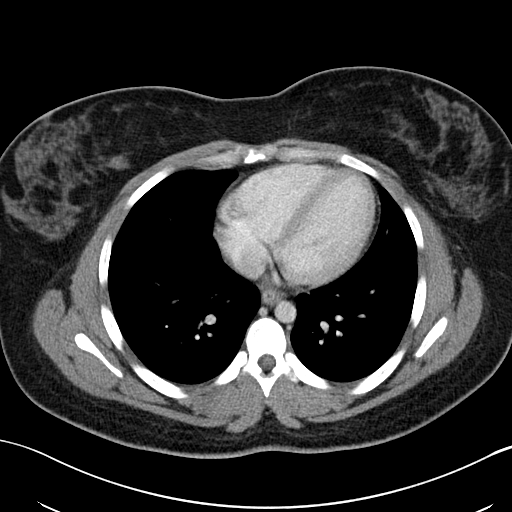
[im 87/116  lung]
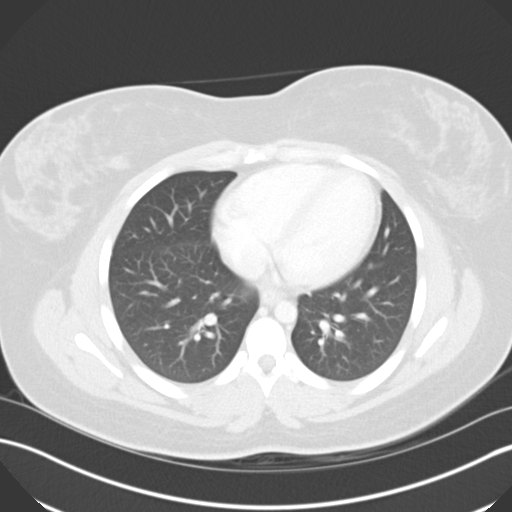
[im 96/116  lung]
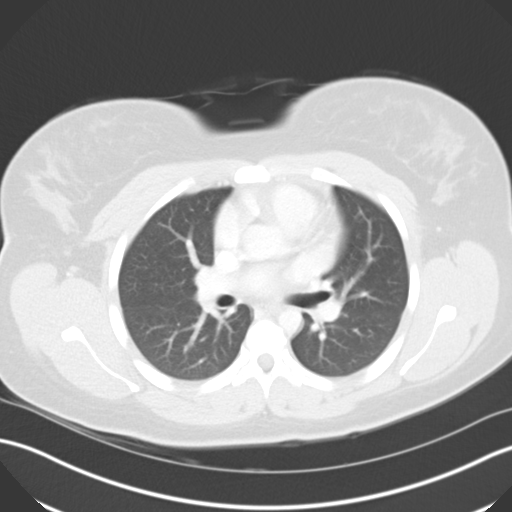
[im 106/116  lung]
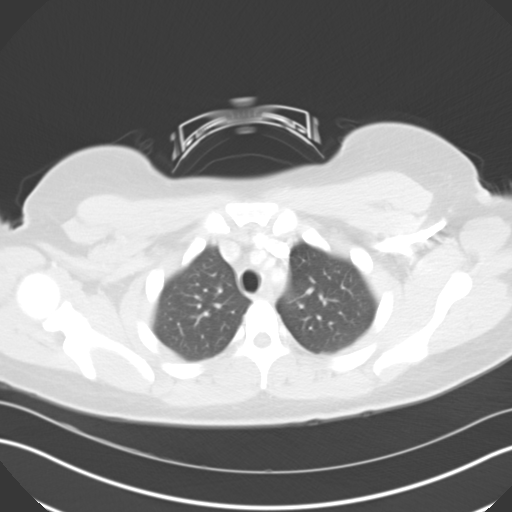

[Series 5: coronals · coronal · 0.70mm/px · 3 of 162 slices shown]
[im 33/162  lung]
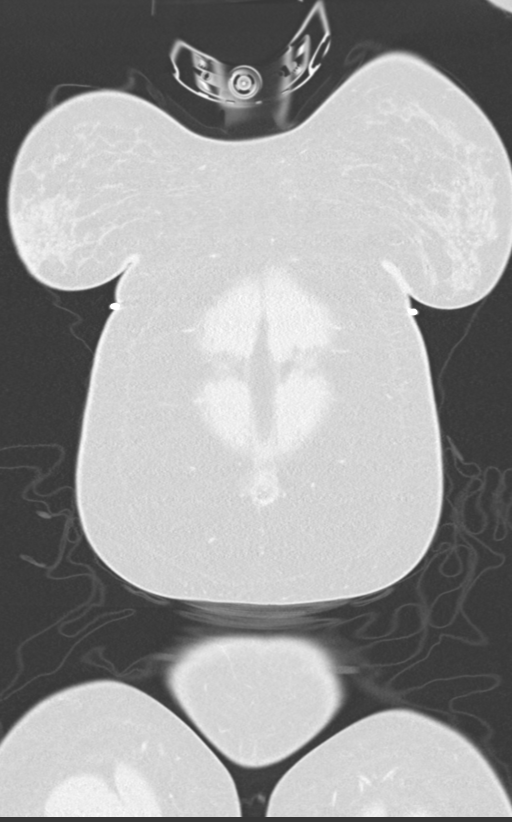
[im 65/162  lung]
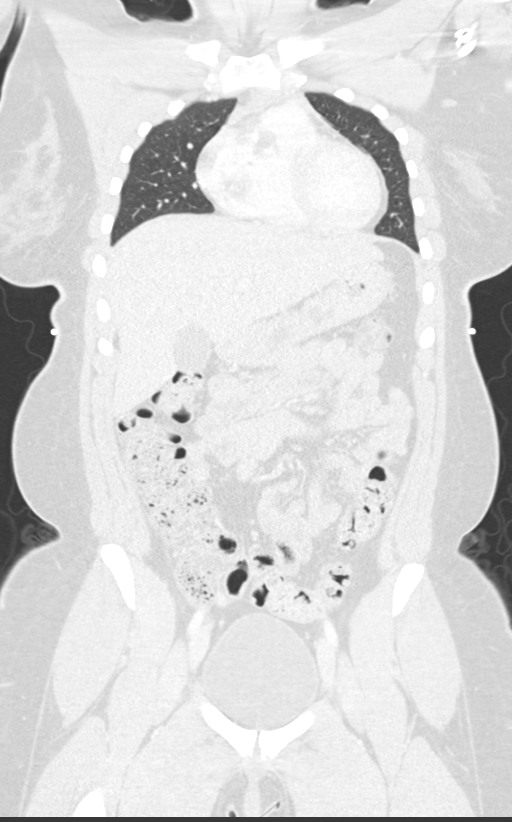
[im 97/162  lung]
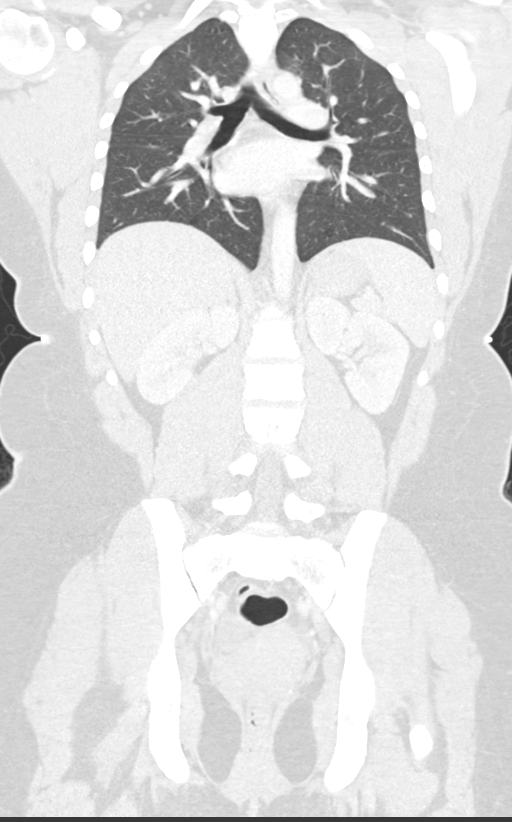

[14 of 36 positions shown; findings below may reference images not displayed]

FINDINGS: CT CHEST FINDINGS

Cardiovascular: There is no cardiomegaly or pericardial effusion.
The thoracic aorta is unremarkable. The central pulmonary arteries
are grossly unremarkable.

Mediastinum/Nodes: No hilar or mediastinal adenopathy. The esophagus
and the thyroid gland are grossly unremarkable. No mediastinal fluid
collection or hematoma.

Lungs/Pleura: Lungs are clear. No pleural effusion or pneumothorax.

Musculoskeletal: No chest wall mass or suspicious bone lesions
identified.

CT ABDOMEN PELVIS FINDINGS

No intra-abdominal free air or free fluid.

Hepatobiliary: No focal liver abnormality is seen. No gallstones,
gallbladder wall thickening, or biliary dilatation.

Pancreas: Unremarkable. No pancreatic ductal dilatation or
surrounding inflammatory changes.

Spleen: Normal in size without focal abnormality.

Adrenals/Urinary Tract: Adrenal glands are unremarkable. Kidneys are
normal, without renal calculi, focal lesion, or hydronephrosis.
Bladder is unremarkable.

Stomach/Bowel: Stomach is within normal limits. Appendix appears
normal. No evidence of bowel wall thickening, distention, or
inflammatory changes.

Vascular/Lymphatic: No significant vascular findings are present. No
enlarged abdominal or pelvic lymph nodes.

Reproductive: The uterus is anteverted and grossly unremarkable.
There is a 3.5 cm dominant follicle or cyst in the right ovary. The
left ovary is unremarkable.

Other: Small fat containing umbilical hernia.

Musculoskeletal: No acute or significant osseous findings.
IMPRESSION: No acute/traumatic intrathoracic, abdominal, or pelvic pathology.

## 2020-07-16 ENCOUNTER — Other Ambulatory Visit: Payer: Self-pay

## 2020-07-16 ENCOUNTER — Other Ambulatory Visit: Payer: BC Managed Care – PPO

## 2020-07-16 DIAGNOSIS — Z20822 Contact with and (suspected) exposure to covid-19: Secondary | ICD-10-CM | POA: Diagnosis not present

## 2020-07-18 LAB — NOVEL CORONAVIRUS, NAA: SARS-CoV-2, NAA: NOT DETECTED

## 2020-07-18 LAB — SARS-COV-2, NAA 2 DAY TAT

## 2020-09-08 ENCOUNTER — Ambulatory Visit (INDEPENDENT_AMBULATORY_CARE_PROVIDER_SITE_OTHER): Payer: BC Managed Care – PPO | Admitting: Student in an Organized Health Care Education/Training Program

## 2020-09-08 ENCOUNTER — Encounter: Payer: Self-pay | Admitting: Student in an Organized Health Care Education/Training Program

## 2020-09-08 ENCOUNTER — Other Ambulatory Visit (HOSPITAL_COMMUNITY)
Admission: RE | Admit: 2020-09-08 | Discharge: 2020-09-08 | Disposition: A | Discharge status: Home / Self Care | Payer: BC Managed Care – PPO | Source: Ambulatory Visit | Attending: Family Medicine | Admitting: Family Medicine

## 2020-09-08 ENCOUNTER — Other Ambulatory Visit: Payer: Self-pay

## 2020-09-08 DIAGNOSIS — Z113 Encounter for screening for infections with a predominantly sexual mode of transmission: Secondary | ICD-10-CM | POA: Insufficient documentation

## 2020-09-08 DIAGNOSIS — R41840 Attention and concentration deficit: Secondary | ICD-10-CM | POA: Diagnosis not present

## 2020-09-08 LAB — POCT WET PREP (WET MOUNT)
Clue Cells Wet Prep Whiff POC: NEGATIVE
Trichomonas Wet Prep HPF POC: ABSENT

## 2020-09-08 NOTE — Assessment & Plan Note (Signed)
Describes poor concentration.  Patient was on her phone throughout our entire encounter. Describes poor eating, sleeping habits and does no physical activity. Denies interference with her school performance.  First-line recommendation is for increasing her physical activity, improving her sleep habits and modifying her study habits without having distractions around her like her phone.  If not improved with these changes, can consider evaluation for attention disorder.

## 2020-09-08 NOTE — Patient Instructions (Signed)
It was a pleasure to see you today!  To summarize our discussion for this visit:  We are testing for STIs today. I'll let you know your results via mychart. Call/message if you have any concerns.  For your joint stiffness, and focus issues, I would recommend some lifestyle modifications firstline to treat. This includes increasing your activity level, weight loss, and improving your sleep habits. Stress can also contribute to these symptoms.   Some additional health maintenance measures we should update are: Health Maintenance Due  Topic Date Due  . INFLUENZA VACCINE  06/20/2020  .   Call the clinic at [phone] if your symptoms worsen or you have any concerns.   Thank you for allowing me to take part in your care,  Dr. Doristine Mango   The Journal of Orthopaedic and Sports Physical Therapy, 44(10), 748. [email or web address]">  How to Increase Your Level of Physical Activity Getting regular physical activity is important for your overall health and well-being. Most people do not get enough exercise. There are easy ways to increase your level of physical activity, even if you have not been very active in the past or if you are just starting out. How can increasing my physical activity affect me? Physical activity has many short-term and long-term benefits. Being active on a regular basis can improve your physical and mental health as well as provide other benefits. Physical health benefits  Helping you lose weight or maintain a healthy weight.  Strengthening your muscles and bones.  Reducing your risk of certain long-term (chronic) diseases, including heart disease, cancer, and diabetes.  Being able to move around more easily and for longer periods of time without getting tired (increased stamina).  Improving your ability to fight off illness (enhanced immunity).  Being able to sleep better.  Helping you stay healthy as you get older,  including: ? Helping you stay mobile, or capable of walking and moving around. ? Preventing accidents, such as falls. ? Increasing life expectancy. Mental health benefits  Boosting your mood and improving your self-esteem.  Lowering your chance of having mental health problems, such as depression or anxiety.  Helping you feel good about your body. Other benefits  Finding new sources of fun and enjoyment.  Meeting new people who share a common interest. What steps can I take to be more physically active? Getting started  If you have a chronic illness or have not been active for a while, check with your health care provider about how to get started. Ask your health care provider what activities are safe for you.  Start out slowly. Walking or doing some simple chair exercises is a good place to start, especially if you have not been active before or for a long time.  Set goals that you can work toward. Ask your health care provider how much exercise is best for you. In general, most adults should: ? Do moderate-intensity exercise for at least 150 minutes each week (30 minutes on most days of the week) or vigorous exercise for at least 75 minutes each week, or a combination of these.  Moderate-intensity exercise can include walking at a quick pace, biking, yoga, water aerobics, or gardening.  Vigorous exercise involves activities that take more effort, such as jogging or running, playing sports, swimming laps, or jumping rope. ? Do strength exercises on at least 2 days each week. This can include weight lifting, body weight exercises, and resistance-band exercises.  Consider using a fitness tracker, such as a mobile phone  app or a device worn like a watch, that will count the number of steps you take each day. Many people strive to reach 10,000 steps a day. Choosing activities  Try to find activities that you enjoy. You are more likely to commit to an exercise routine if it does not  feel like a chore.  If you have bone or joint problems, choose low-impact exercises, like walking or swimming.  Use these tips for being successful with an exercise plan: ? Find a workout partner for accountability. ? Join a group or class, such as an aerobics class, cycling class, or sports team. ? Make family time active. Go for a walk, bike, or swim. ? Include a variety of exercises each week. Being active in your daily routines Besides your formal exercise plans, you can find ways to do physical activity during your daily routines, such as:  Walking or biking to work or to the store.  Taking the stairs instead of the elevator.  Parking farther away from the door at work or at the store.  Planning walking meetings.  Walking around while you are on the phone.  Where to find more information  Centers for Disease Control and Prevention: WorkDashboard.es  President's Council on Fitness, Sports & Nutrition: www.fitness.gov  ChooseMyPlate: FormerBoss.no Contact a health care provider if:  You have headaches, muscle aches, or joint pain.  You feel dizzy or light-headed while exercising.  You faint.  You have chest pain while exercising. Summary  Exercise benefits your mind and body at any age, even if you are just starting out.  If you have a chronic illness or have not been active for a while, check with your health care provider before increasing your physical activity.  Choose activities that are safe and enjoyable for you. Ask your health care provider what activities are safe for you.  Start slowly. Tell your health care provider if you have problems as you start to increase your activity level. This information is not intended to replace advice given to you by your health care provider. Make sure you discuss any questions you have with your health care provider. Document Revised: 08/11/2019 Document Reviewed: 06/02/2019 Elsevier Patient Education   [place].

## 2020-09-08 NOTE — Assessment & Plan Note (Signed)
No current symptoms. Wants clearance for new sexual partner Pelvic exam completed today with screening for GC/chlamydia. HIV and RPR. HSV PCR per patient request.  Denies any concerns for UTI or pregnancy so did not collect urine sample. Patient declined discussion on contraception. Uses condoms consistently

## 2020-09-08 NOTE — Assessment & Plan Note (Signed)
>>  ASSESSMENT AND PLAN FOR SCREEN FOR STD (SEXUALLY TRANSMITTED DISEASE) WRITTEN ON 09/08/2020  2:30 PM BY ANDERSON, CHELSEY L, DO  No current symptoms. Wants clearance for new sexual partner Pelvic exam completed today with screening for GC/chlamydia. HIV and RPR. HSV PCR per patient request.  Denies any concerns for UTI or pregnancy so did not collect urine sample. Patient declined discussion on contraception. Uses condoms consistently

## 2020-09-08 NOTE — Progress Notes (Signed)
    SUBJECTIVE:   CHIEF COMPLAINT / HPI: STD check  Has a new sexual partner so wants to get a clear check up. No discharge, pruritus, bleeding, lesions, dyspareunia at this time. Her LMP was sept 17th. Does not have regular periods. Does not use birth control and not interested in discussing. Consistent with condom use.   Having joint pains chronically, unchanged- worsens when its cold outside. Joints feels stiff. Bones crack in hands and have since childhood. Has tried stretching and it helps. Has h/o growing pains. No family history of arthritis or other joint disorders such as lupus. No real activity level. No pain when she has been active in the past. Stiffness improves with movement. Worse in the mornings if she doesn't stretch the night before.  No fevers, rashes or weight loss.   Poor Focus-No h/o adhd or add. Never been evaluated. She was a good student in grade/undergrad but has had worsening when she's getting older. Currently in grad school. Not interfering with her grades. Thinks it takes her longer to complete tasks than her peers. Has poor sleep quality/habits.   OBJECTIVE:   BP 112/78   Pulse 97   Wt 237 lb 6.4 oz (107.7 kg)   SpO2 98%   BMI 40.75 kg/m   General: NAD, pleasant, able to participate in exam Cardiac: RRR, normal heart sounds, no murmurs. 2+ radial and PT pulses bilaterally Respiratory: CTAB, normal effort, No wheezes, rales or rhonchi Abdomen: soft, nontender, nondistended, no hepatic or splenomegaly, +BS Pelvic: negative for external lesions. Some toilet paper present in labia. Positive for cervical cyst in 4 oclock position which patient was aware of. Neg discharge or tenderness to exam.  Extremities: no edema. WWP. Skin: warm and dry, no rashes noted Neuro: alert and oriented, no focal deficits Psych: Normal affect and mood  ASSESSMENT/PLAN:   Screen for STD (sexually transmitted disease) No current symptoms. Wants clearance for new sexual  partner Pelvic exam completed today with screening for GC/chlamydia. HIV and RPR. HSV PCR per patient request.  Denies any concerns for UTI or pregnancy so did not collect urine sample. Patient declined discussion on contraception. Uses condoms consistently   Poor concentration Describes poor concentration.  Patient was on her phone throughout our entire encounter. Describes poor eating, sleeping habits and does no physical activity. Denies interference with her school performance.  First-line recommendation is for increasing her physical activity, improving her sleep habits and modifying her study habits without having distractions around her like her phone.  If not improved with these changes, can consider evaluation for attention disorder.      [place]

## 2020-09-09 LAB — CERVICOVAGINAL ANCILLARY ONLY
Chlamydia: NEGATIVE
Comment: NEGATIVE
Comment: NORMAL
Neisseria Gonorrhea: NEGATIVE

## 2020-09-13 ENCOUNTER — Encounter: Payer: Self-pay | Admitting: Student in an Organized Health Care Education/Training Program

## 2020-09-14 LAB — RPR: RPR Ser Ql: NONREACTIVE

## 2020-09-14 LAB — HSV DNA BY PCR (REFERENCE LAB)
HSV 2 DNA: NEGATIVE
HSV-1 DNA: NEGATIVE

## 2020-09-14 LAB — HIV ANTIBODY (ROUTINE TESTING W REFLEX): HIV Screen 4th Generation wRfx: NONREACTIVE

## 2020-09-30 DIAGNOSIS — L668 Other cicatricial alopecia: Secondary | ICD-10-CM | POA: Diagnosis not present

## 2020-09-30 DIAGNOSIS — L700 Acne vulgaris: Secondary | ICD-10-CM | POA: Diagnosis not present

## 2020-11-17 ENCOUNTER — Other Ambulatory Visit: Payer: Self-pay

## 2020-11-17 ENCOUNTER — Ambulatory Visit (INDEPENDENT_AMBULATORY_CARE_PROVIDER_SITE_OTHER): Payer: BC Managed Care – PPO | Admitting: Family Medicine

## 2020-11-17 ENCOUNTER — Other Ambulatory Visit (HOSPITAL_COMMUNITY)
Admission: RE | Admit: 2020-11-17 | Discharge: 2020-11-17 | Disposition: A | Discharge status: Home / Self Care | Payer: BC Managed Care – PPO | Source: Ambulatory Visit | Attending: Family Medicine | Admitting: Family Medicine

## 2020-11-17 VITALS — BP 100/62 | HR 80 | Ht 64.0 in | Wt 228.6 lb

## 2020-11-17 DIAGNOSIS — Z23 Encounter for immunization: Secondary | ICD-10-CM

## 2020-11-17 DIAGNOSIS — Z3200 Encounter for pregnancy test, result unknown: Secondary | ICD-10-CM

## 2020-11-17 DIAGNOSIS — Z113 Encounter for screening for infections with a predominantly sexual mode of transmission: Secondary | ICD-10-CM | POA: Insufficient documentation

## 2020-11-17 LAB — POCT WET PREP (WET MOUNT)
Clue Cells Wet Prep Whiff POC: NEGATIVE
Trichomonas Wet Prep HPF POC: ABSENT

## 2020-11-17 LAB — POCT URINE PREGNANCY: Preg Test, Ur: NEGATIVE

## 2020-11-17 NOTE — Assessment & Plan Note (Signed)
>>  ASSESSMENT AND PLAN FOR SCREEN FOR STD (SEXUALLY TRANSMITTED DISEASE) WRITTEN ON 11/17/2020  2:33 PM BY BRIMAGE, VONDRA, DO  GC and chlamydia DNA  probe sent to lab. HIV and RPR collected. Negative Trichomoniasis, yeast and BV on wet prep.   Patient had unprotected sex 10/24/2020.  LMP 11/11/2020.  Pregnancy test negative.  Declined prophylactic treatment.  Follow-up on HIV, RPR, gonorrhea, chlamydia.  Follow-up with PCP as needed.  Continue discussion on need for contraception and continuous use of barrier protection.  Reviewed recent PAP June 2021: Normal.  Repeat in June 2024.

## 2020-11-17 NOTE — Assessment & Plan Note (Addendum)
GC and chlamydia DNA  probe sent to lab. HIV and RPR collected. Negative Trichomoniasis, yeast and BV on wet prep.   Patient had unprotected sex 10/24/2020.  LMP 11/11/2020.  Pregnancy test negative.  Declined prophylactic treatment.  Follow-up on HIV, RPR, gonorrhea, chlamydia.  Follow-up with PCP as needed.  Continue discussion on need for contraception and continuous use of barrier protection.  Reviewed recent PAP June 2021: Normal.  Repeat in June 2024.

## 2020-11-17 NOTE — Patient Instructions (Addendum)
It was great seeing you today!  You are not pregnant and do not have trichomonas, yeast or bacterial vaginosis.  Your remaining testing will return in about 72 hours.  I will send you a MyChart message reguarding your results.  Regarding lab work today:  Due to recent changes in healthcare laws, you may see the results of your imaging and laboratory studies on MyChart before your provider has had a chance to review them.  I understand that in some cases there may be results that are confusing or concerning to you. Not all laboratory results come back in the same time frame and you may be waiting for multiple results in order to interpret others.  Please give Christy Barnes 72 hours in order for your provider to thoroughly review all the results before contacting the office for clarification of your results. If everything is normal, you will get a letter in the mail or a message in My Chart. Please give Christy Barnes a call if you do not hear from Christy Barnes after 2 weeks.   Feel free to call with any questions or concerns at any time, at [phone].   Take care,  Dr. Rush[facility]   Psychiatry Resource List (Adults and Children) Most of these providers will take Medicaid. please consult your insurance for a complete and updated list of available providers. When calling to make an appointment have your insurance information available to confirm you are covered.   [facility]iatry and Counseling [address] [place], [place] [address] [phone]  [facility]  [place], [place]:   [facility]: [address]     [phone]   Linna Hoff: [place]. [place],        [phone] [place]: [place],    [place] [facility] [place]: [address],                   [place] [facility]: [place] [facility] [place]         [facility]  (Psychiatry only; Adults /children 12 and over, will take Medicaid)  [place], [place], [place] [address]       [phone]   [facility] (Psychiatry & counseling ; adults & children ; will take Medicaid [address]  [place] [address]   Go on-line to complete referral ( [email or web address] [phone]   (Spanish therapist)  [facility]  Psychiatry & counseling; Adults and children;  Call Registration prior to scheduling an appointment [phone] [address]    [place], [place] [address]    [phone]  [facility] (Psychiatry & counseling; adults & children; Medicare no Medicaid)  [place], [place]  [address]      [phone]    [facility] (up to age 29)  Psychiatry & counseling ,will take Medicaid, must do counseling to receive psychiatry services  [address] [place] [address]        ([facility] (Psychiatry & counseling; adults & children; will take Medicaid) Will need a referral from provider [address],  [place], [place]  [phone]   [facility] --- Walk-In Mon-Friday 8am-3pm ( will take Medicaid, Psychiatry, Adults &  children,  [address] [place], [place]   [phone]   [facility], Walk-in M-F 8am-12pm and 1pm -3pm   (Counseling, Psychiatry, will take Medicaid, adults & children)  [address] [place], [place]  [phone]

## 2020-11-17 NOTE — Progress Notes (Signed)
° ° °  SUBJECTIVE:   CHIEF COMPLAINT / HPI:   Chief Complaint  Patient presents with   Unprotected Sex    Christy Barnes is a 24 y.o. female presents for STD testing.   STI check  - recently became sexually active with a new partner, wants to be checked for STIs. Previously negative RPR, HIV in 06/2018. No symptoms in her partner. - Symptoms: vaginal yeasty odor, pelvic discomfort  - Medications tried: Honey Pot suppositories (containing tea tree oil) which helped  - Sexually active with 1 female partner - Last sexual encounter: 10/24/20.  - Contraception: condoms  Symptoms - Abnormal vaginal discharge: no - Missed period: no   - Fever: no - Abdominal/Pelvic pain: yes  - Vaginal bleeding: no - Pain during sex: no - Genital rash/lesions: no      PERTINENT  PMH / PSH: reviewed and updated as appropriate   OBJECTIVE:   BP 100/62    Pulse 80    Ht 5\' 4"  (1.626 m)    Wt 228 lb 9.6 oz (103.7 kg)    LMP 11/11/2020    SpO2 100%    BMI 39.24 kg/m    GEN: well appearing female in no acute distress  CVS: well perfused  RESP: speaking in full sentences without pause  ABD: soft, non-tender, non-distended, no palpable masses  Pelvic exam: normal external genitalia, vulva, VAGINA and CERVIX: normal appearing cervix without discharge or lesions, ADNEXA: normal adnexa in size, nontender and no masses, WET MOUNT done - results: negative for pathogens, normal epithelial cells, exam chaperoned by CMA Shari.     ASSESSMENT/PLAN:   Screen for STD (sexually transmitted disease) GC and chlamydia DNA  probe sent to lab. HIV and RPR collected. Negative Trichomoniasis, yeast and BV on wet prep.   Patient had unprotected sex 10/24/2020.  LMP 11/11/2020.  Pregnancy test negative.  Declined prophylactic treatment.  Follow-up on HIV, RPR, gonorrhea, chlamydia.  Follow-up with PCP as needed.  Continue discussion on need for contraception and continuous use of barrier protection.  Reviewed recent  PAP June 2021: Normal.  Repeat in June 2024.    COVID vaccinated.  Influenza given today  July 2024, DO [facility]

## 2020-11-18 LAB — CERVICOVAGINAL ANCILLARY ONLY
Chlamydia: NEGATIVE
Comment: NEGATIVE
Comment: NORMAL
Neisseria Gonorrhea: NEGATIVE

## 2020-11-18 LAB — RPR: RPR Ser Ql: NONREACTIVE

## 2020-11-18 LAB — HIV ANTIBODY (ROUTINE TESTING W REFLEX): HIV Screen 4th Generation wRfx: NONREACTIVE

## 2020-11-22 ENCOUNTER — Other Ambulatory Visit: Payer: Self-pay | Admitting: Family Medicine

## 2020-11-23 MED ORDER — ADAPALENE-BENZOYL PEROXIDE 0.1-2.5 % EX GEL: 45.0000 g | Freq: Every day | CUTANEOUS | 0 refills | Status: DC

## 2020-11-26 DIAGNOSIS — H40013 Open angle with borderline findings, low risk, bilateral: Secondary | ICD-10-CM | POA: Diagnosis not present

## 2020-11-26 DIAGNOSIS — H5213 Myopia, bilateral: Secondary | ICD-10-CM | POA: Diagnosis not present

## 2020-12-16 ENCOUNTER — Other Ambulatory Visit: Payer: Self-pay | Admitting: Family Medicine

## 2020-12-16 ENCOUNTER — Encounter: Payer: Self-pay | Admitting: Family Medicine

## 2020-12-16 DIAGNOSIS — M2550 Pain in unspecified joint: Secondary | ICD-10-CM

## 2020-12-17 ENCOUNTER — Encounter: Payer: Self-pay | Admitting: Family Medicine

## 2021-01-18 ENCOUNTER — Other Ambulatory Visit (HOSPITAL_COMMUNITY)
Admission: RE | Admit: 2021-01-18 | Discharge: 2021-01-18 | Disposition: A | Discharge status: Home / Self Care | Payer: BC Managed Care – PPO | Source: Ambulatory Visit | Attending: Family Medicine | Admitting: Family Medicine

## 2021-01-18 ENCOUNTER — Other Ambulatory Visit: Payer: Self-pay

## 2021-01-18 ENCOUNTER — Ambulatory Visit (INDEPENDENT_AMBULATORY_CARE_PROVIDER_SITE_OTHER): Payer: BC Managed Care – PPO | Admitting: Family Medicine

## 2021-01-18 VITALS — BP 106/58 | HR 80 | Ht 64.0 in | Wt 227.2 lb

## 2021-01-18 DIAGNOSIS — Z113 Encounter for screening for infections with a predominantly sexual mode of transmission: Secondary | ICD-10-CM

## 2021-01-18 DIAGNOSIS — L700 Acne vulgaris: Secondary | ICD-10-CM | POA: Diagnosis not present

## 2021-01-18 DIAGNOSIS — Z3202 Encounter for pregnancy test, result negative: Secondary | ICD-10-CM | POA: Diagnosis not present

## 2021-01-18 LAB — POCT WET PREP (WET MOUNT)
Clue Cells Wet Prep Whiff POC: NEGATIVE
Trichomonas Wet Prep HPF POC: ABSENT

## 2021-01-18 LAB — POCT URINE PREGNANCY: Preg Test, Ur: NEGATIVE

## 2021-01-18 NOTE — Assessment & Plan Note (Addendum)
UPT negative Does not BCP/Shot/Nexplanon for contraception.  Discussed with patient and see reports that she had previously tried these and had significant hair loss.  She does not want to try any other options. Offered education on different method but patient declined. Pap smear:not indicated at this time Samples for Wet prep, GC/Chlamydia obtained Follow up with PCP as needed

## 2021-01-18 NOTE — Progress Notes (Signed)
    SUBJECTIVE:   CHIEF COMPLAINT / HPI:   Christy Barnes is a 25 y.o. female  here for an annual gynecological exam.   Gyn concerns/Preventative healthcare  Last menstrual period: Patient's last menstrual period was 01/09/2021.  Regular periods: no, sometimes has missed periods, longest missed period 2 months  Heavy bleeding: no  Sexually active: yes, uses condoms.  4 mal partners in last 6 months  Contraception or hormonal therapy: none, does not desire any  Hx of STD: Patient does desire STD screening  Dyspareunia: No  Hot flashes: No  Vaginal discharge: none  Dysuria:No   Last mammogram: n/a  Breast mass or concerns: No  Last pap smear: 96/2952  Taking Folic Acid daily  She is also concerned for a small lump on her lower lip that may be herpes.  No recent oral sex. Denies any burning or itching sensation.  No blisters or fluid drainage.  She reports it was tender but she then squeezed it and now there has been some crusting. No history of cold sores.   She also reports that she thinks she may have eczema in her upper shoulders and neck.  She reports that she has been very itchy in these areas and has been using homemade Shea butter which has been helping.  There is no ras there at present.  She has a history of Allergic rhinitis for which she takes Zyrtec as needed.  PERTINENT  PMH / PSH:  Allergic Rhinitis Acne vulgaris Elevated BMI  OBJECTIVE:   BP (!) 106/58   Pulse 80   Ht 5\' 4"  (1.626 m)   Wt 227 lb 3.2 oz (103.1 kg)   LMP 01/09/2021   SpO2 99%   BMI 39.00 kg/m    General: Alert, no acute distress Right lower lip: small healing comedome noted, no drainage or erythema present Neck/Shoulders: no rash, redness or lesions present, skin smooth and well hydrated Pelvic Exam chaperoned by CMA Shari        External: normal female genitalia, small furuncle noted to left upper mons, nontender and fading        Vagina: normal without lesions or masses         Cervix: normal without lesions or masses.  Nabothian cyst noted at 5 o'clock          ASSESSMENT/PLAN:   Screen for STD (sexually transmitted disease) UPT negative Does not BCP/Shot/Nexplanon for contraception.  Discussed with patient and see reports that she had previously tried these and had significant hair loss.  She does not want to try any other options. Offered education on different method but patient declined. Pap smear:not indicated at this time Samples for Wet prep, GC/Chlamydia obtained Follow up with PCP as needed  Acne vulgaris Dermatology referral was sent 6 months. Will check with patient to see if she had been seen. Healing comodome Advised to not squeeze lesions when having breakout   Carollee Leitz, MD [facility]

## 2021-01-18 NOTE — Assessment & Plan Note (Signed)
>>  ASSESSMENT AND PLAN FOR SCREEN FOR STD (SEXUALLY TRANSMITTED DISEASE) WRITTEN ON 01/19/2021  8:53 AM BY Carollee Leitz, MD  UPT negative Does not BCP/Shot/Nexplanon for contraception.  Discussed with patient and see reports that she had previously tried these and had significant hair loss.  She does not want to try any other options. Offered education on different method but patient declined. Pap smear:not indicated at this time Samples for Wet prep, GC/Chlamydia obtained Follow up with PCP as needed

## 2021-01-18 NOTE — Patient Instructions (Addendum)
Thank you for coming to see me today. It was a pleasure.   We will get some labs today.  If they are abnormal or we need to do something about them, I will call you.  If they are normal, I will send you a message on MyChart (if it is active) or a letter in the mail.  If you don't hear from [place] in 2 weeks, please call the office at the number below.  I will call you if you need to be treated with antibiotics.  Your next PAP is due in June 2024  Please follow-up with PCP as needed  If you have any questions or concerns, please do not hesitate to call the office at [phone].  Best,   Carollee Leitz, MD    Pregnancy and Sexually Transmitted Infections An STI (sexually transmitted infection) is a disease or infection that may be passed (transmitted) from person to person, usually during sexual activity. This may happen by way of saliva, semen, blood, vaginal mucus, or urine. An STI can be caused by bacteria, viruses, or parasites. Sexually transmitted infections can be passed to your unborn baby and can cause other complications during pregnancy. It is important to take steps to reduce your chances of getting an STI. You should be seen by your health care provider right away if you think you may have an STI, or if you think you may have been exposed to an STI. Diagnosis and treatment will depend on the type of STI. If you are already pregnant, you will be screened for HIV (human immunodeficiency virus) and other STIs early in your pregnancy. If you are at high risk for HIV or any STIs, these tests may be repeated during your third trimester of pregnancy. What are some common STIs? There are different types of STIs. Some STIs that cause problems in pregnancy include:  Gonorrhea.  Chlamydia.  Syphilis.  HIV and AIDS (acquired immunodeficiency syndrome).  Genital herpes.  Hepatitis.  Genital warts.  Human papillomavirus (HPV).  Trichomoniasis. STIs that do not affect the baby  include:  Chancroid.  Pubic lice. How does an STI affect me? Different STIs can cause different problems. STIs can affect your health and the health of your pregnancy. Your health  Painful or bloody urination.  Pain in the pelvis, abdomen, vagina, anus, throat, or eyes.  A skin rash, itching, or irritation.  Growths, ulcerations, blisters, or sores in the genital and anal areas.  Fever.  Abnormal vaginal discharge, with or without bad odor.  Pain or bleeding during sex.  Yellowing of the skin and the white parts of the eyes (jaundice).  Swollen glands in the groin area. Some women may not have any symptoms. Even if symptoms are not present, an STI can still be passed to another person during sexual contact. Your pregnancy  Premature labor.  Premature rupture of the membranes.  Infection of the amniotic sac.  Infections that you get after giving birth (postpartum). How does this affect my baby? An STI can cause serious problems in your baby. It can cause:  Stillbirth.  Miscarriage.  Birth defects or deformities.  Infections and illnesses that occur after birth.  Low birth weight. What should I do if I think I have an STI?  See your health care provider.  Tell your sexual partner or partners. They should be tested and treated for any STIs.  Do not have sex until your health care provider says it is okay. How are STIs diagnosed?  Your health care provider can use tests to determine if you have an STI. These may include blood tests, urine tests, and tests performed during a pelvic exam. Talk to your health care provider about whether you should be screened for STIs if:  You are sexually active and are not in a monogamous relationship.  You engage in risky sexual behaviors or do not practice safe sex.  Your sexual activity has changed since the last time you were screened. What can I do to lower my risk? Take these actions to reduce your risk of getting an  STI: Sex partners  Avoid having many sex partners.  Do not have sex with someone who has other sex partners.  Do not have sex with anyone you do not know or who is at high risk for an STI. Other tips  Do not have any oral, vaginal, or anal sex. This is known as practicing abstinence.  If you have sex, use a latex condom or a female condom correctly and every time you have sex.  Use dental dams and water-soluble lubricants during sex. Do not use petroleum jelly or oils.  Avoid risky sex acts that can break the skin.  Do not have sex if you have open sores on your mouth or skin.  Avoid engaging in oral and anal sex acts.  Get the hepatitis vaccine. It is safe for pregnant women.      Contact a health care provider if:  You have any symptoms of an STI.  You think that you or your sexual partner has an STI, even if there are no symptoms.  You think that you may have been exposed to an STI. Get help right away if:  You have been diagnosed with an STI, and you cannot feel your baby move.  You are leaking fluid from your vagina.  You have signs or symptoms of labor before 37 weeks of pregnancy. These include: ? Contractions that are 5 minutes or less apart or that increase in frequency, intensity, or length. ? Sudden, sharp abdominal pain or low back pain. ? An uncontrolled gush or trickle of fluid from your vagina. Summary  An STI (sexually transmitted infection) is a disease or infection that may be passed (transmitted) from person to person, usually during sexual activity.  Sexually transmitted infections can be passed to your unborn baby and can cause other complications during pregnancy.  See your health care provider if you have been exposed to an STI or have signs or symptoms of an STI so that you and your partner can be treated.  Detection and treatment of an STI in pregnancy is key to decreasing or preventing complications for your baby. Get regular prenatal care  and talk to your health care provider about concerns you may have. This information is not intended to replace advice given to you by your health care provider. Make sure you discuss any questions you have with your health care provider. Document Revised: 07/09/2020 Document Reviewed: 09/10/2019 Elsevier Patient Education  [employer] [place] [place].

## 2021-01-19 ENCOUNTER — Encounter: Payer: Self-pay | Admitting: Family Medicine

## 2021-01-19 LAB — CERVICOVAGINAL ANCILLARY ONLY
Chlamydia: NEGATIVE
Comment: NEGATIVE
Comment: NORMAL
Neisseria Gonorrhea: NEGATIVE

## 2021-01-19 NOTE — Assessment & Plan Note (Addendum)
Dermatology referral was sent 6 months. Will check with patient to see if she had been seen. Healing comodome Advised to not squeeze lesions when having breakout

## 2021-01-21 LAB — RPR: RPR Ser Ql: NONREACTIVE

## 2021-01-21 LAB — HIV ANTIBODY (ROUTINE TESTING W REFLEX): HIV Screen 4th Generation wRfx: NONREACTIVE

## 2021-01-28 ENCOUNTER — Encounter: Payer: Self-pay | Admitting: Family Medicine

## 2021-01-28 ENCOUNTER — Other Ambulatory Visit: Payer: Self-pay | Admitting: Family Medicine

## 2021-01-28 MED ORDER — FLUTICASONE PROPIONATE 50 MCG/ACT NA SUSP: 2.0000 | Freq: Every day | NASAL | 6 refills | Status: DC

## 2021-01-28 MED ORDER — FOLIC ACID 1 MG PO TABS: 1.0000 mg | ORAL_TABLET | Freq: Every day | ORAL | 3 refills | Status: DC

## 2021-03-23 ENCOUNTER — Ambulatory Visit (INDEPENDENT_AMBULATORY_CARE_PROVIDER_SITE_OTHER): Payer: BC Managed Care – PPO | Admitting: Family Medicine

## 2021-03-23 ENCOUNTER — Other Ambulatory Visit (HOSPITAL_COMMUNITY)
Admission: RE | Admit: 2021-03-23 | Discharge: 2021-03-23 | Disposition: A | Discharge status: Home / Self Care | Payer: BC Managed Care – PPO | Source: Ambulatory Visit | Attending: Family Medicine | Admitting: Family Medicine

## 2021-03-23 ENCOUNTER — Encounter: Payer: Self-pay | Admitting: Family Medicine

## 2021-03-23 ENCOUNTER — Other Ambulatory Visit: Payer: Self-pay

## 2021-03-23 VITALS — BP 98/64 | HR 84 | Wt 227.6 lb

## 2021-03-23 DIAGNOSIS — Z3009 Encounter for other general counseling and advice on contraception: Secondary | ICD-10-CM | POA: Diagnosis not present

## 2021-03-23 DIAGNOSIS — Z7251 High risk heterosexual behavior: Secondary | ICD-10-CM

## 2021-03-23 DIAGNOSIS — Z113 Encounter for screening for infections with a predominantly sexual mode of transmission: Secondary | ICD-10-CM

## 2021-03-23 LAB — POCT URINE PREGNANCY: Preg Test, Ur: NEGATIVE

## 2021-03-23 NOTE — Assessment & Plan Note (Signed)
>>  ASSESSMENT AND PLAN FOR SCREEN FOR STD (SEXUALLY TRANSMITTED DISEASE) WRITTEN ON 03/23/2021 11:47 AM BY KIM, RACHEL E, MD  Obtained GC/C, wet prep, HIV, RPR. Follow up with results.

## 2021-03-23 NOTE — Assessment & Plan Note (Signed)
Obtained GC/C, wet prep, HIV, RPR. Follow up with results.  

## 2021-03-23 NOTE — Assessment & Plan Note (Signed)
Counseled on contraception. Can consider lower androgen pills in the future if patient agreeable (micronor, ortho triccyclen lo). Patient declines contraception at this time. Counseled on folic acid, which patient already takes. She will check dose when she gets home.

## 2021-03-23 NOTE — Progress Notes (Signed)
    SUBJECTIVE:   CHIEF COMPLAINT / HPI:   STD Testing  Patient presenting today for STD testing. She is currently sexually active with 1 female partner. Reports no history of STDS. Denies any discharge, genital lesions, dysuria, dyspareunia.  Contraception: None and previous hair loss due to depo shots and implanon. Reports trying OCPs for one week, but stopped due to side effects. Does not desire pregnancy at this time, but does not want to do any contraception due to previous issues. Patient's last menstrual period was 03/15/2021 (exact date).. Last pap was 04/21/20, NIL.  Denies partner violence and reports feeling safe in relationships.  PERTINENT  PMH / PSH: No history of abnormal pap smears.   OBJECTIVE:  BP 98/64   Pulse 84   Wt 227 lb 9.6 oz (103.2 kg)   LMP 03/15/2021 (Exact Date)   SpO2 98%   BMI 39.07 kg/m   General: well appearing, NAD GU: External vulva and vagina nonerythematous, without any obvious lesions or rash.  No abnormal discharge appreciated.  Normal ruggae of vaginal walls.  Cervix is non erythematous and non-friable.  There is no cervical motion tenderness, masses or gross abnormalities appreciated during bimanual exam.   ASSESSMENT/PLAN:   Screen for STD (sexually transmitted disease) Obtained GC/C, wet prep, HIV, RPR. Follow up with results.    Encounter for counseling regarding contraception Counseled on contraception. Can consider lower androgen pills in the future if patient agreeable (micronor, ortho triccyclen lo). Patient declines contraception at this time. Counseled on folic acid, which patient already takes. She will check dose when she gets home.    Wilber Oliphant, MD [facility]

## 2021-03-24 LAB — CERVICOVAGINAL ANCILLARY ONLY
Chlamydia: NEGATIVE
Comment: NEGATIVE
Comment: NEGATIVE
Comment: NORMAL
Neisseria Gonorrhea: NEGATIVE
Trichomonas: NEGATIVE

## 2021-06-09 ENCOUNTER — Other Ambulatory Visit (HOSPITAL_COMMUNITY)
Admission: RE | Admit: 2021-06-09 | Discharge: 2021-06-09 | Disposition: A | Discharge status: Home / Self Care | Payer: BC Managed Care – PPO | Source: Ambulatory Visit | Attending: Family Medicine | Admitting: Family Medicine

## 2021-06-09 ENCOUNTER — Other Ambulatory Visit: Payer: BC Managed Care – PPO

## 2021-06-09 ENCOUNTER — Ambulatory Visit (INDEPENDENT_AMBULATORY_CARE_PROVIDER_SITE_OTHER): Payer: BC Managed Care – PPO | Admitting: Family Medicine

## 2021-06-09 ENCOUNTER — Other Ambulatory Visit: Payer: Self-pay

## 2021-06-09 VITALS — BP 126/87 | HR 96 | Wt 229.6 lb

## 2021-06-09 DIAGNOSIS — Z114 Encounter for screening for human immunodeficiency virus [HIV]: Secondary | ICD-10-CM

## 2021-06-09 DIAGNOSIS — Z3200 Encounter for pregnancy test, result unknown: Secondary | ICD-10-CM

## 2021-06-09 DIAGNOSIS — Z7251 High risk heterosexual behavior: Secondary | ICD-10-CM

## 2021-06-09 DIAGNOSIS — Z113 Encounter for screening for infections with a predominantly sexual mode of transmission: Secondary | ICD-10-CM | POA: Diagnosis not present

## 2021-06-09 LAB — POCT WET PREP (WET MOUNT)
Clue Cells Wet Prep Whiff POC: NEGATIVE
Trichomonas Wet Prep HPF POC: ABSENT

## 2021-06-09 LAB — POCT URINE PREGNANCY: Preg Test, Ur: NEGATIVE

## 2021-06-09 MED ORDER — FLUTICASONE PROPIONATE 50 MCG/ACT NA SUSP: 2.0000 | Freq: Every day | NASAL | 6 refills | Status: DC

## 2021-06-09 NOTE — Patient Instructions (Addendum)
It was great seeing you today.  We have tested you for all the STDs. If there are any abnormalities I will call you with the results.  We are going to schedule you for a Nexplanon on 27 July with Dr. Thompson Grayer.  I have any questions or concerns please let me know.  Please remain abstinent for the next week.  I hope you have a wonderful afternoon!

## 2021-06-09 NOTE — Progress Notes (Signed)
    SUBJECTIVE:   CHIEF COMPLAINT / HPI:   STD testing Sexually active approximately 2 weeks ago.  Menses ended yesterday.  Sexual contact with someone who was sexually active with someone else.  Would like to get tested for all the STDs as well as a pregnancy test.  Nipple pain has been present for the past 2 weeks but has resolved today.  Denies any abnormal discharge or signs of STDs.  Is not on birth control at this time but is interested in birth control.  Menses ended yesterday per the patient's report.  OBJECTIVE:   BP 126/87   Pulse 96   Wt 229 lb 9.6 oz (104.1 kg)   SpO2 100%   BMI 39.41 kg/m   General: Well-appearing 25 year old female in no acute distress Respiratory: Normal work of breathing, lungs clear to auscultation bilaterally Abdomen: Soft, nontender, positive bowel sounds GU: Normal external female genitalia, normal internal vaginal tissue with no abnormalities, mild amount of brownish discharge from the cervical os.  Cervix is nonfriable, nontender  ASSESSMENT/PLAN:   Screen for STD (sexually transmitted disease) Patient sexually active, unprotected sex.  GC/C obtained today, wet prep, HIV, RPR ordered.  Urine pregnancy also collected today and was negative.  Wet prep negative for any abnormalities.  We will notify patient if any positive test come back.  Discussed birth control and patient is interested in [place].  Scheduled for appointment to have Nexplanon placed.  No further questions or concerns.     Gifford Shave, MD [place]

## 2021-06-10 ENCOUNTER — Encounter: Payer: Self-pay | Admitting: Family Medicine

## 2021-06-10 LAB — CERVICOVAGINAL ANCILLARY ONLY
Chlamydia: NEGATIVE
Comment: NEGATIVE
Comment: NEGATIVE
Comment: NORMAL
Neisseria Gonorrhea: NEGATIVE
Trichomonas: NEGATIVE

## 2021-06-10 LAB — HIV ANTIBODY (ROUTINE TESTING W REFLEX): HIV Screen 4th Generation wRfx: NONREACTIVE

## 2021-06-10 LAB — RPR: RPR Ser Ql: NONREACTIVE

## 2021-06-10 MED ORDER — FLUTICASONE PROPIONATE 50 MCG/ACT NA SUSP: 2.0000 | Freq: Every day | NASAL | 6 refills | Status: DC

## 2021-06-10 NOTE — Assessment & Plan Note (Addendum)
Patient sexually active, unprotected sex.  GC/C obtained today, wet prep, HIV, RPR ordered.  Urine pregnancy also collected today and was negative.  Wet prep negative for any abnormalities.  We will notify patient if any positive test come back.  Discussed birth control and patient is interested in [place].  Scheduled for appointment to have Nexplanon placed.  No further questions or concerns.

## 2021-06-10 NOTE — Assessment & Plan Note (Signed)
>>  ASSESSMENT AND PLAN FOR SCREEN FOR STD (SEXUALLY TRANSMITTED DISEASE) WRITTEN ON 06/10/2021 10:25 AM BY Gifford Shave, MD  Patient sexually active, unprotected sex.  GC/C obtained today, wet prep, HIV, RPR ordered.  Urine pregnancy also collected today and was negative.  Wet prep negative for any abnormalities.  We will notify patient if any positive test come back.  Discussed birth control and patient is interested in [place].  Scheduled for appointment to have Nexplanon placed.  No further questions or concerns.

## 2021-06-15 ENCOUNTER — Ambulatory Visit: Payer: BC Managed Care – PPO | Admitting: Family Medicine

## 2021-06-21 DIAGNOSIS — Z30017 Encounter for initial prescription of implantable subdermal contraceptive: Secondary | ICD-10-CM | POA: Insufficient documentation

## 2021-06-21 NOTE — Progress Notes (Signed)
    SUBJECTIVE:   CHIEF COMPLAINT / HPI:   STI check- at last visit checked for everything else, but she is Wanting herpes blood test as she gets nervous about this and worries if not checked. Has never had lesions or outbreak. Does not always use condoms.   PERTINENT  PMH / PSH: allergic rhinitis  OBJECTIVE:   BP 106/72   Pulse 74   Ht '5\' 4"'$  (1.626 m)   Wt 225 lb (102.1 kg)   LMP 06/06/2021   SpO2 99%   BMI 38.62 kg/m   General: alert & oriented, no apparent distress, well groomed HEENT: normocephalic, atraumatic, EOM grossly intact, oral mucosa moist, neck supple Respiratory: normal respiratory effort GI: non-distended Skin: no rashes, no jaundice Psych: appropriate mood and affect   ASSESSMENT/PLAN:   Encounter for initial prescription of Nexplanon Nexplanon Insertion Note  A negative pregnancy test was obtained. Early pregnancy was excluded as she has not been sexually active since her LMP.   Benefits, indications/contraindications and potential risks of insertion including local infection, scar/keloid formation, and unscheduled bleeding were discussed with the patient. Site (check): '[x]'$  Left Arm  Sterile Preparation: '[x]'$  Betadine  Insertion site was selected 8 - 10 cm from medial epicondyle and marked along with guiding site using sterile marker. The expected insertion tunnel was anesthetized with 3 mL of 1% lidocaine with epinephrine. Procedure area was prepped and draped in a sterile fashion. 11 blade used to make small incision in skin. Nexplanon  trocar was inserted subcutaneously and then Nexplanon  capsule delivered subcutaneously. Trocar was removed from the insertion site. Nexplanon  capsule was palpated by provider and patient to assure satisfactory placement. Estimated blood loss of < 1 mL Dressings applied: steristrips and gauze dressing The patient tolerated the procedure well without complications.  '[x]'$  Backup Contraception [condoms, abstinence ]  advised for 7 days for the following reason: [ x] LMP > 7 days ago   Patient was advised that the device needs to be removed within 3 years from today's date. A card was given with this information. Patient counselled on signs/symptoms for follow-up and instructed to make a return appointment with PCP in 1-2 months.   Screen for STD (sexually transmitted disease) - HSV serologies checked today after discussion that this is not indicated and even if positive would not be for treatment, patient prefers to check     Lenoria Chime, [place]

## 2021-06-22 ENCOUNTER — Ambulatory Visit (INDEPENDENT_AMBULATORY_CARE_PROVIDER_SITE_OTHER): Payer: BC Managed Care – PPO | Admitting: Family Medicine

## 2021-06-22 ENCOUNTER — Other Ambulatory Visit: Payer: Self-pay

## 2021-06-22 VITALS — BP 106/72 | HR 74 | Ht 64.0 in | Wt 225.0 lb

## 2021-06-22 DIAGNOSIS — Z3046 Encounter for surveillance of implantable subdermal contraceptive: Secondary | ICD-10-CM

## 2021-06-22 DIAGNOSIS — Z113 Encounter for screening for infections with a predominantly sexual mode of transmission: Secondary | ICD-10-CM

## 2021-06-22 DIAGNOSIS — J309 Allergic rhinitis, unspecified: Secondary | ICD-10-CM | POA: Diagnosis not present

## 2021-06-22 DIAGNOSIS — Z30017 Encounter for initial prescription of implantable subdermal contraceptive: Secondary | ICD-10-CM

## 2021-06-22 LAB — POCT URINE PREGNANCY: Preg Test, Ur: NEGATIVE

## 2021-06-22 MED ORDER — FLUTICASONE PROPIONATE 50 MCG/ACT NA SUSP: 2.0000 | Freq: Every day | NASAL | 6 refills | Status: DC

## 2021-06-22 NOTE — Assessment & Plan Note (Signed)
>>  ASSESSMENT AND PLAN FOR SCREEN FOR STD (SEXUALLY TRANSMITTED DISEASE) WRITTEN ON 06/22/2021 10:35 AM BY PRAY, MARGARET E, MD  - HSV serologies checked today after discussion that this is not indicated and even if positive would not be for treatment, patient prefers to check

## 2021-06-22 NOTE — Patient Instructions (Signed)
It was wonderful to see you today.  Please bring ALL of your medications with you to every visit.   Today we talked about:  - Nexplanon placed today, please use back up contraception for one week after placement, and don't forget condoms are the only protection for sexually transmitted infections - We have checked for antibodies to herpes infection today, will send message with results   Thank you for choosing [facility].   Please call [phone] with any questions about today's appointment.  Please be sure to schedule follow up at the front  desk before you leave today.   Yehuda Savannah, MD  Family Medicine

## 2021-06-22 NOTE — Assessment & Plan Note (Signed)
Nexplanon Insertion Note  A negative pregnancy test was obtained. Early pregnancy was excluded as she has not been sexually active since her LMP.   Benefits, indications/contraindications and potential risks of insertion including local infection, scar/keloid formation, and unscheduled bleeding were discussed with the patient. Site (check): '[x]'$  Left Arm  Sterile Preparation: '[x]'$  Betadine  Insertion site was selected 8 - 10 cm from medial epicondyle and marked along with guiding site using sterile marker. The expected insertion tunnel was anesthetized with 3 mL of 1% lidocaine with epinephrine. Procedure area was prepped and draped in a sterile fashion. 11 blade used to make small incision in skin. Nexplanon  trocar was inserted subcutaneously and then Nexplanon  capsule delivered subcutaneously. Trocar was removed from the insertion site. Nexplanon  capsule was palpated by provider and patient to assure satisfactory placement. Estimated blood loss of < 1 mL Dressings applied: steristrips and gauze dressing The patient tolerated the procedure well without complications.  '[x]'$  Backup Contraception [condoms, abstinence ] advised for 7 days for the following reason: [ x] LMP > 7 days ago   Patient was advised that the device needs to be removed within 3 years from today's date. A card was given with this information. Patient counselled on signs/symptoms for follow-up and instructed to make a return appointment with PCP in 1-2 months.

## 2021-06-22 NOTE — Assessment & Plan Note (Signed)
-   HSV serologies checked today after discussion that this is not indicated and even if positive would not be for treatment, patient prefers to check

## 2021-06-23 IMAGING — US US PELVIS COMPLETE WITH TRANSVAGINAL
1 series · 15 of 25 positions shown · non-contrast
Comparison: 03/20/2018

CLINICAL DATA: Acute pelvic pain in a female



[Series 1: us pelvis complete with transvaginal · 15 of 81 slices shown]
[im 1/81]
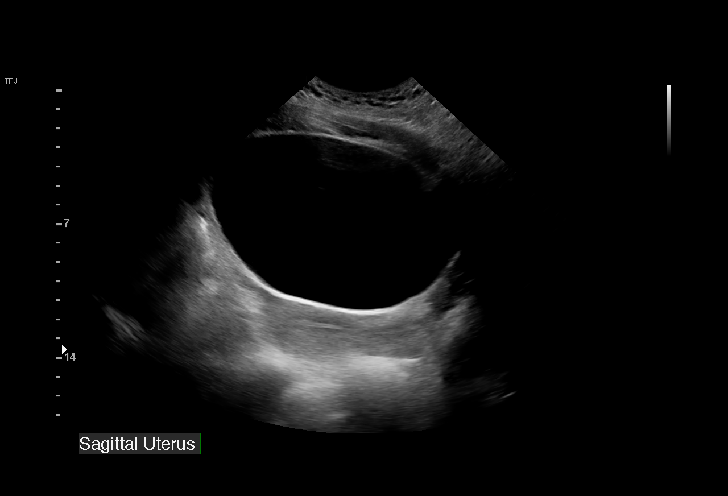
[im 7/81]
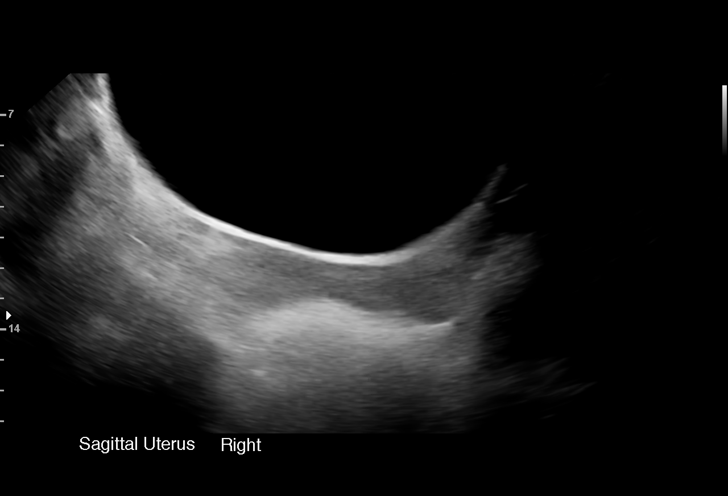
[im 14/81]
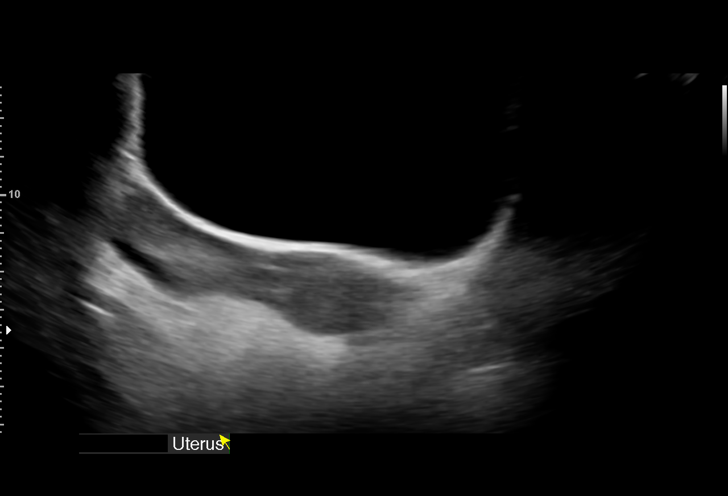
[im 17/81]
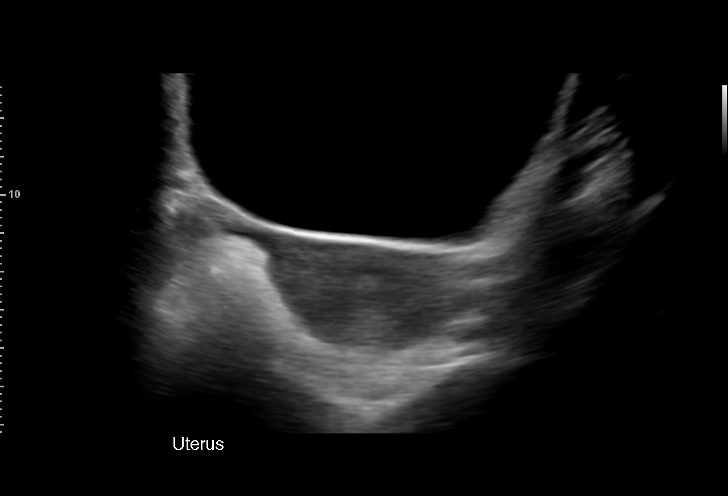
[im 24/81]
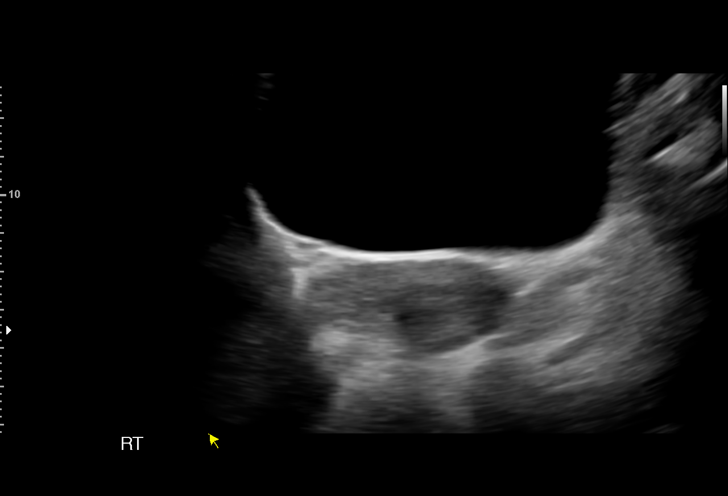
[im 31/81]
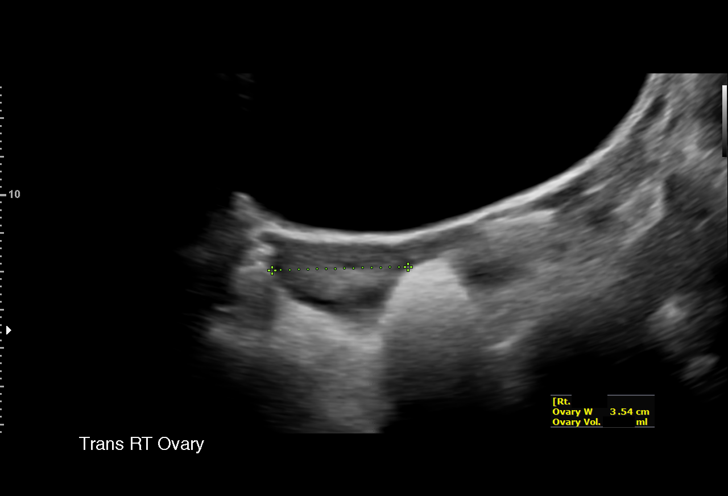
[im 34/81]
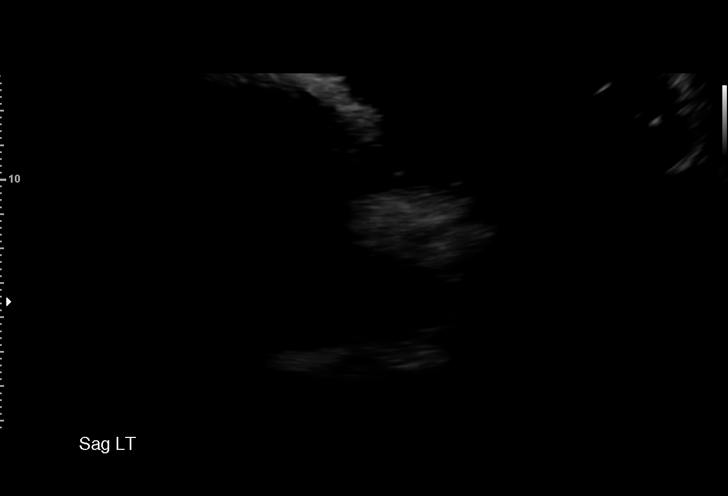
[im 41/81]
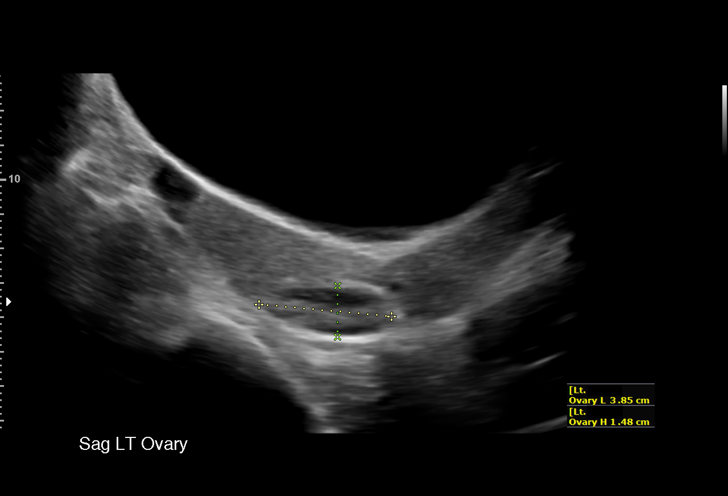
[im 47/81]
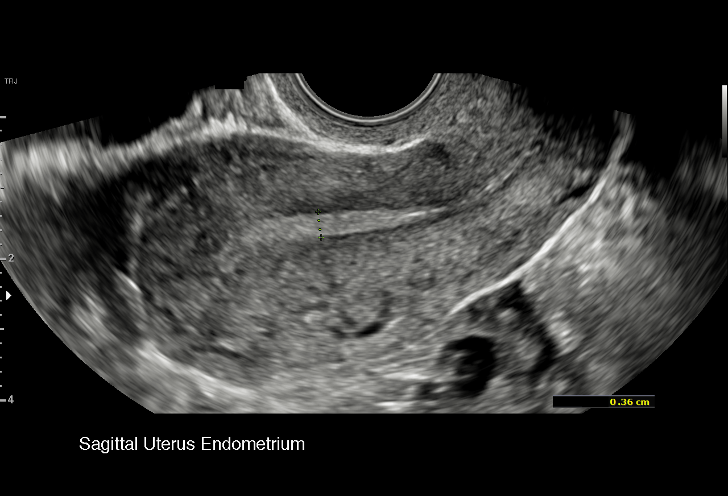
[im 51/81]
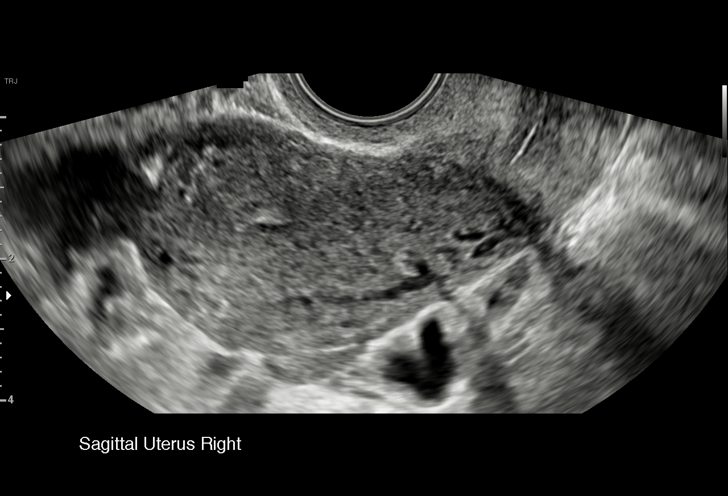
[im 57/81]
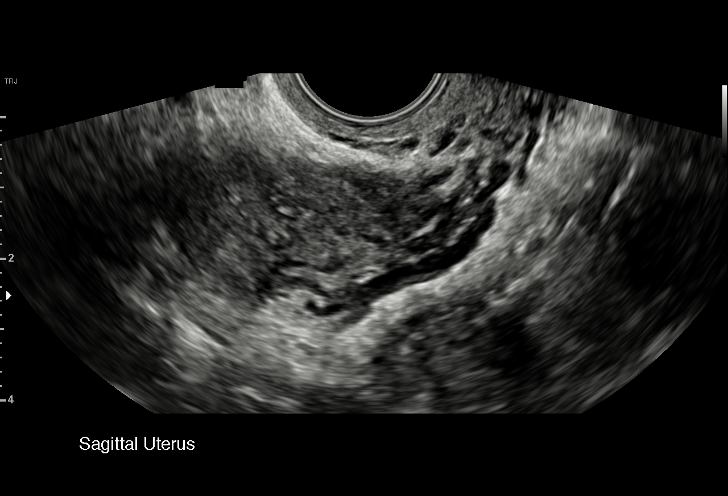
[im 64/81]
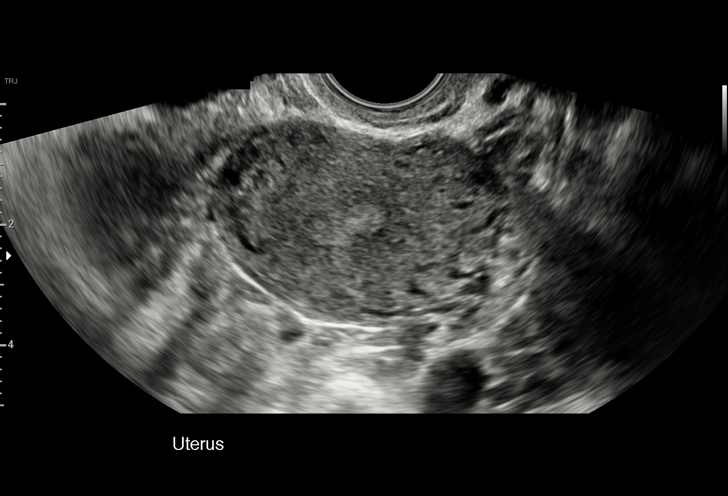
[im 67/81]
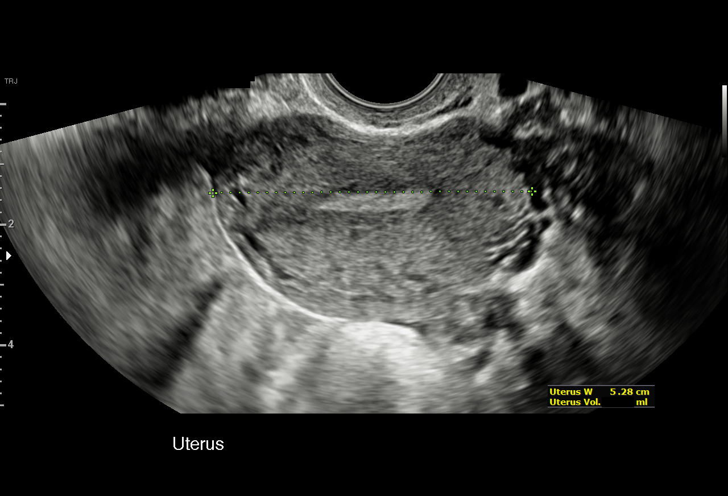
[im 74/81]
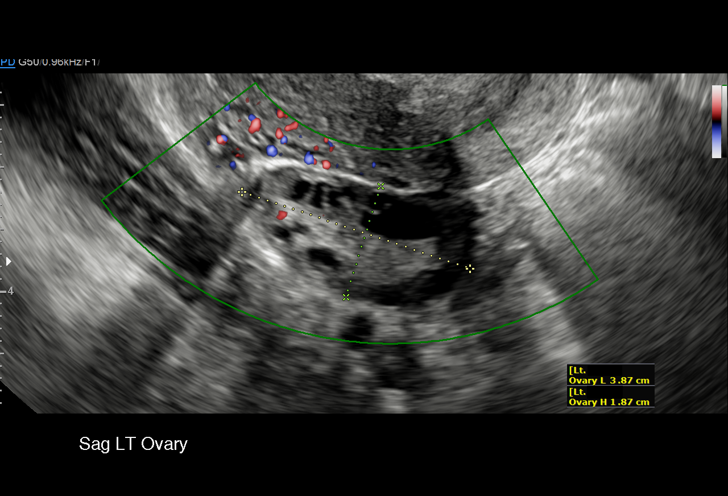
[im 81/81]
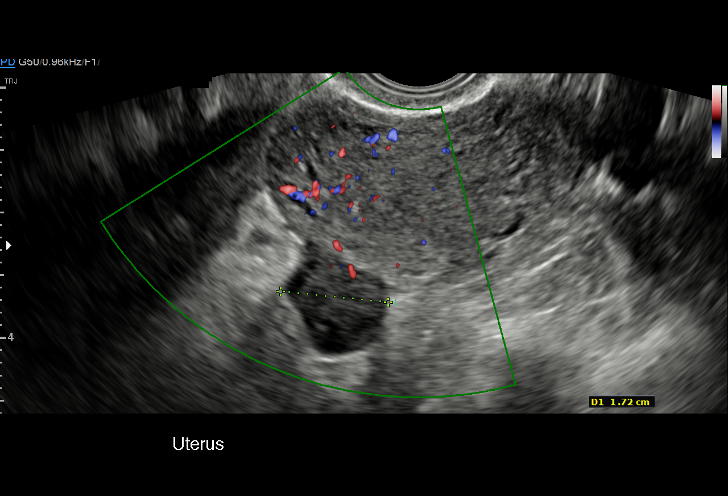

[15 of 25 positions shown; findings below may reference images not displayed]

FINDINGS: Uterus

Measurements: 9.2 x 2.9 x 4.9 cm = volume: 69 mL. Anteverted and
minimally anteflexed. Nabothian cyst at cervix. Small exophytic
leiomyoma at posterior fundus 14 x 17 x 17 mm.

Endometrium

Thickness: 6 mm.  No endometrial fluid or focal abnormality

Right ovary

Measurements: 4.5 x 2.0 x 3.1 cm = volume: 14.7 mL. Small resolving
corpus luteum. No additional mass. Internal blood flow present on
color Doppler imaging.

Left ovary

Measurements: 3.9 x 1.9 x 2.5 cm = volume: 9.5 mL. Normal morphology
without mass. Internal blood flow present on color Doppler imaging.

Other findings

No free pelvic fluid.  No adnexal masses.
IMPRESSION: Small exophytic leiomyoma at posterior uterine fundus 17 mm greatest
size.

Remainder of exam normal.

## 2021-06-24 LAB — HSV(HERPES SIMPLEX VRS) I + II AB-IGM: HSVI/II Comb IgM: <0.91 Ratio (ref 0.00–0.90)

## 2021-07-04 ENCOUNTER — Telehealth: Payer: Self-pay

## 2021-07-04 NOTE — Telephone Encounter (Signed)
Patient calls nurse line reporting positive covid test on 8/14. Patient reports symptoms started on 8/12 with cough and congestion. Patient denies fever, body aches, or SOB. Patient would like to start antiviral therapy. Please advise.

## 2021-07-05 MED ORDER — ETONOGESTREL 68 MG ~~LOC~~ IMPL
68.0000 mg | DRUG_IMPLANT | Freq: Once | SUBCUTANEOUS | Status: AC
  Administered 2021-06-22: 68 mg via SUBCUTANEOUS

## 2021-07-05 NOTE — Telephone Encounter (Signed)
Called patient and scheduled for appointment tomorrow afternoon.   Talbot Grumbling, RN

## 2021-07-05 NOTE — Addendum Note (Signed)
Addended by: Talbot Grumbling on: 07/05/2021 11:24 AM   Modules accepted: Orders

## 2021-07-06 ENCOUNTER — Ambulatory Visit (INDEPENDENT_AMBULATORY_CARE_PROVIDER_SITE_OTHER): Payer: BC Managed Care – PPO | Admitting: Family Medicine

## 2021-07-06 VITALS — BP 100/70 | HR 70 | Temp 98.1°F

## 2021-07-06 DIAGNOSIS — B349 Viral infection, unspecified: Secondary | ICD-10-CM

## 2021-07-06 DIAGNOSIS — R059 Cough, unspecified: Secondary | ICD-10-CM | POA: Diagnosis not present

## 2021-07-06 NOTE — Patient Instructions (Signed)
Today you were tested for COVID. This is likely a viral illness that will get better with time. I will notify you of results when available. Continue to stay hydrated. Return to care if symptoms fail to improve or worsen.   Dr. Janus Molder

## 2021-07-06 NOTE — Progress Notes (Signed)
    SUBJECTIVE:   CHIEF COMPLAINT / HPI:   Christy Barnes is a 25 yo F who presents for the below.  Symptoms started last week with fever, chills, and congestion. She tested negative for COVID then. She now  has continued cough and body aches and general malaise. Her parents were found to be COVID positive this week. She would like to be tested again today.   PERTINENT  PMH / PSH: As above.   OBJECTIVE:   BP 100/70   Pulse 70   Temp 98.1 F (36.7 C)   LMP 06/06/2021   SpO2 100%   General: Appears well, no acute distress. Age appropriate. Cardiac: RRR, normal heart sounds, no murmurs Respiratory: CTAB, normal effort Abdomen: soft, nontender, nondistended   ASSESSMENT/PLAN:   Viral illness 1 week of undulating symptoms of cough, congestion, fever and body aches. She is afebrile today and well appearing. Likely on the back end of an acute viral illness. With known sick contacts will test for COVID today. Otherwise discussed supportive care measures and return precautions. - Novel Coronavirus, NAA ([facility])  Gerlene Fee, DO [place]

## 2021-07-09 ENCOUNTER — Encounter: Payer: Self-pay | Admitting: Family Medicine

## 2021-07-09 LAB — NOVEL CORONAVIRUS, NAA: SARS-CoV-2, NAA: NOT DETECTED

## 2021-07-09 LAB — SARS-COV-2, NAA 2 DAY TAT

## 2021-07-13 ENCOUNTER — Encounter: Payer: Self-pay | Admitting: Family Medicine

## 2021-07-29 ENCOUNTER — Other Ambulatory Visit (HOSPITAL_COMMUNITY)
Admission: RE | Admit: 2021-07-29 | Discharge: 2021-07-29 | Disposition: A | Discharge status: Home / Self Care | Payer: BC Managed Care – PPO | Source: Ambulatory Visit | Attending: Family Medicine | Admitting: Family Medicine

## 2021-07-29 ENCOUNTER — Other Ambulatory Visit: Payer: Self-pay

## 2021-07-29 ENCOUNTER — Ambulatory Visit (INDEPENDENT_AMBULATORY_CARE_PROVIDER_SITE_OTHER): Payer: BC Managed Care – PPO | Admitting: Family Medicine

## 2021-07-29 VITALS — BP 122/80 | HR 82 | Ht 63.0 in | Wt 224.4 lb

## 2021-07-29 DIAGNOSIS — N898 Other specified noninflammatory disorders of vagina: Secondary | ICD-10-CM | POA: Insufficient documentation

## 2021-07-29 DIAGNOSIS — E669 Obesity, unspecified: Secondary | ICD-10-CM

## 2021-07-29 DIAGNOSIS — Z111 Encounter for screening for respiratory tuberculosis: Secondary | ICD-10-CM | POA: Diagnosis not present

## 2021-07-29 DIAGNOSIS — Z021 Encounter for pre-employment examination: Secondary | ICD-10-CM

## 2021-07-29 LAB — POCT WET PREP (WET MOUNT)
Clue Cells Wet Prep Whiff POC: NEGATIVE
Trichomonas Wet Prep HPF POC: ABSENT

## 2021-07-29 NOTE — Progress Notes (Addendum)
     SUBJECTIVE:   CHIEF COMPLAINT / HPI:   Vaginal Discharge: Patient is a 25 y.o. female presenting with vaginal discharge for 1-2 weeks. She states the discharge is of a watery consistency.  She endorses a strong vaginal odor as well. Denies dysuria. Last intercourse July. Nexplanon in place since 06/22/21.   She also requests a 12 panel drug screen for her work as well as TB test.    OBJECTIVE:   BP 122/80   Pulse 82   Ht '5\' 3"'$  (1.6 m)   Wt 224 lb 6.4 oz (101.8 kg)   SpO2 100%   BMI 39.75 kg/m    General: NAD, pleasant, able to participate in exam Respiratory: Normal effort, no obvious respiratory distress Pelvic: VULVA: normal appearing vulva with no masses, tenderness or lesions, VAGINA: Normal appearing vagina with normal color, no lesions, with scant and clear discharge present, CERVIX: No lesions, scant discharge present,  Chaperone Michelle CMA present for pelvic exam  ASSESSMENT/PLAN:   No problem-specific Assessment & Plan notes found for this encounter.   Vaginal discharge and odor  Patient complains of a watery discharge and foul odor. Denies urinary symptoms. Physical exam with minimal clear discharge and without lesions.  - Wet prep   Drug screen and TB test Needed for work at [employer] - f/u [employer]  - f/u quant gold   Obesity BMI 39. Patient working on weight loss. Will check cholesterol today   Shary Key, [place]

## 2021-07-29 NOTE — Patient Instructions (Addendum)
It was great seeing you today!  Today you came in for STI check and for a drug screen for work. We also checked your cholesterol. I will call you if any result is abnormal, and if normal I will send a mychart message.   Feel free to call with any questions or concerns at any time, at [phone].   Take care,  Dr. Shary Key [facility]

## 2021-07-30 LAB — LIPID PANEL
Chol/HDL Ratio: 2.2 ratio (ref 0.0–4.4)
Cholesterol, Total: 158 mg/dL (ref 100–199)
HDL: 73 mg/dL (ref >39)
LDL Chol Calc (NIH): 77 mg/dL (ref 0–99)
Triglycerides: 33 mg/dL (ref 0–149)
VLDL Cholesterol Cal: 8 mg/dL (ref 5–40)

## 2021-08-01 ENCOUNTER — Encounter: Payer: Self-pay | Admitting: Family Medicine

## 2021-08-01 LAB — CERVICOVAGINAL ANCILLARY ONLY
Chlamydia: NEGATIVE
Comment: NEGATIVE
Comment: NORMAL
Neisseria Gonorrhea: NEGATIVE

## 2021-08-02 LAB — QUANTIFERON-TB GOLD PLUS
QuantiFERON Mitogen Value: >10 IU/mL
QuantiFERON Nil Value: 0 IU/mL
QuantiFERON TB1 Ag Value: 0 IU/mL
QuantiFERON TB2 Ag Value: 0 IU/mL
QuantiFERON-TB Gold Plus: NEGATIVE

## 2021-08-05 LAB — TOXASSURE SELECT 13 (MW), URINE

## 2021-08-19 ENCOUNTER — Ambulatory Visit (INDEPENDENT_AMBULATORY_CARE_PROVIDER_SITE_OTHER): Payer: BC Managed Care – PPO | Admitting: Family Medicine

## 2021-08-19 ENCOUNTER — Other Ambulatory Visit: Payer: Self-pay

## 2021-08-19 VITALS — BP 100/60 | HR 98

## 2021-08-19 DIAGNOSIS — Z021 Encounter for pre-employment examination: Secondary | ICD-10-CM | POA: Diagnosis not present

## 2021-08-19 DIAGNOSIS — Z23 Encounter for immunization: Secondary | ICD-10-CM | POA: Diagnosis not present

## 2021-08-19 DIAGNOSIS — J309 Allergic rhinitis, unspecified: Secondary | ICD-10-CM | POA: Diagnosis not present

## 2021-08-19 MED ORDER — FLUTICASONE PROPIONATE 50 MCG/ACT NA SUSP: 2.0000 | Freq: Every day | NASAL | 6 refills | Status: DC

## 2021-08-19 NOTE — Patient Instructions (Addendum)
It was great seeing you today!  Today you came in for a repeat drug screen. We repeated the drug panel. If your job still wont accept this I recommend contacting [place] to complete the screening [phone] ) as this is typically where the employees will complete the required screenings for [facility].   Feel free to call with any questions or concerns at any time, at [phone].   Take care,  Dr. Shary Key [facility]

## 2021-08-19 NOTE — Progress Notes (Signed)
    SUBJECTIVE:   CHIEF COMPLAINT / HPI:   Ms. 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 Her internship.  [facility] stated that she needed a repeat drug test.  She was not sure exactly what was wrong with the last test.  Advised her that I discussed with the head of her lab and it would be the seen drug test we ordered last time. Discussed the option of gong to [facility] for employee drug screen but she opted to re try the drug panel here, and if there was any issues she will try [facility].   Also requesting flu shot and refill of her Flonase   OBJECTIVE:   BP 100/60   Pulse 98   LMP 07/31/2021   SpO2 99%    Physical exam General: well appearing, NAD Cardiovascular: RRR, no murmurs Lungs: CTAB. Normal WOB Abdomen: soft, non-distended, non-tender Skin: warm, dry. No edema  ASSESSMENT/PLAN:   No problem-specific Assessment & Plan notes found for this encounter.   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 First test earlier in the month not approved by job. Advised it would be the same test done again and she agreed to it, and if it does not get approved she will try [facility] for the screening. Provided her with the phone number - f/u drug screen   Allergic rhinitis - refilled [place] maintenance  - flu vaccine    [place]

## 2021-08-25 LAB — TOXASSURE SELECT 13 (MW), URINE

## 2021-09-29 ENCOUNTER — Ambulatory Visit: Payer: BC Managed Care – PPO

## 2021-10-05 ENCOUNTER — Telehealth: Payer: Self-pay | Admitting: Family Medicine

## 2021-10-05 NOTE — Telephone Encounter (Addendum)
I called to discuss the procedure appointment made for her for this Friday. Unfortunately, this was an inappropriate scheduling. However, she is unable to come in any other day for this procedure. We agreed that she was going to arrive at least 5-10 minutes earlier to allow enough time for her Nexplanon removal procedure so it does not encroach on the next patient's appointment slot. I also did a previsit questionnaire with her over the telephone this morning - this will be updated as needed on Friday.   Med: She is not currently taking any of the medications listed on file.  CC: Nexplanon removal and Chronic pelvic pain  HPI: Contraceptive management: The patient c/o poor side effects with her Nexplanon birth control. Her most recent Nexplanon was placed three months ago. Since then, she has worsened her depression, pelvic pain, heavy and prolonged menstrual bleeding, recurrent BV and yeast infection (intermittent vaginal discharge), and the thinning of her hair. She had been on Depo Provera in the past with poor side effects as well, after which she transitioned to [place] in 2014. She does not want to get on any new birth control.  Pelvic Pain:  She c/o B/L pelvic pain on going since 2019. This had not been fully worked up, she said. The Nexplanon placement made her cramping and pain worse. She is not currently sexually active; her last sexual activity was one month ago (Oct 9th), and she does not use protection regularly.

## 2021-10-07 ENCOUNTER — Other Ambulatory Visit: Payer: Self-pay

## 2021-10-07 ENCOUNTER — Encounter: Payer: Self-pay | Admitting: Family Medicine

## 2021-10-07 ENCOUNTER — Ambulatory Visit (INDEPENDENT_AMBULATORY_CARE_PROVIDER_SITE_OTHER): Payer: BC Managed Care – PPO | Admitting: Family Medicine

## 2021-10-07 VITALS — BP 112/68 | HR 79 | Ht 63.0 in | Wt 219.8 lb

## 2021-10-07 DIAGNOSIS — R102 Pelvic and perineal pain: Secondary | ICD-10-CM | POA: Diagnosis not present

## 2021-10-07 DIAGNOSIS — Z789 Other specified health status: Secondary | ICD-10-CM | POA: Diagnosis not present

## 2021-10-07 DIAGNOSIS — G8929 Other chronic pain: Secondary | ICD-10-CM | POA: Diagnosis not present

## 2021-10-07 DIAGNOSIS — Z309 Encounter for contraceptive management, unspecified: Secondary | ICD-10-CM

## 2021-10-07 LAB — POCT URINE PREGNANCY: Preg Test, Ur: NEGATIVE

## 2021-10-07 NOTE — Patient Instructions (Signed)

## 2021-10-07 NOTE — Progress Notes (Signed)
CC: Nexplanon removal and Chronic pelvic pain  HPI: Contraceptive management: The patient c/o poor side effects with her Nexplanon birth control. Her most recent Nexplanon was placed three months ago. Since then, she has worsened her depression, pelvic pain, heavy and prolonged menstrual bleeding, recurrent BV and yeast infection (intermittent vaginal discharge), and the thinning of her hair. She had been on Depo Provera in the past with poor side effects as well, after which she transitioned to [place] in 2014. She does not want to get on any new birth control.  Pelvic Pain:  She c/o B/L pelvic pain on going since 2019. This had not been fully worked up, she said. The Nexplanon placement made her cramping and pain worse. She is not currently sexually active; her last sexual activity was one month ago (Oct 9th), and she does not use protection regularly.   Vitals:   10/07/21 0845  BP: 112/68  Pulse: 79  SpO2: 100%  Weight: 219 lb 12.8 oz (99.7 kg)  Height: 5\' 3"  (1.6 m)    Physical Exam Constitutional:      Appearance: Normal appearance.  Pulmonary:     Effort: Pulmonary effort is normal.    Progestin Implant Removal Note Christy Barnes is here for removal of her etonogestrel rod implant (Implanon/Nexplanon). She would like it removed because of: intolerance.  An informed consent was taken prior to removal and is to be scanned into the Electronic Health Record.  Risks of the procedure include: bleeding, infection, difficulty with removal, scarring and nerve damage. There may be bruising at the site of incision and down the arm.  Procedure Note: Time out taken: Yes  (PATIENT NAME) (PATIENT DOB) confirmed (YES)  Procedure: Progestin Implant Removal  Procedure confirmed by patient and team (YES)  Side: (LEFT)  Position correct for procedure (YES)  Equipment for procedure available (YES)  The patient is place in the supine position. Aseptic conditions are maintained. The rod is  located by palpation. The area is cleaned with antiseptic. 6 cc of 1% lidocaine with epinephrine is injected just underneath the end of the implant closest to the elbow. After firmly pressing down on the end of the implant closer to the axilla a 2-3 mm incision is made with a scalpel. The rod is pushed to the incision site and grasped with a mosquito forceps and gently removed. Blunt dissection was needed. The patient did tolerate the procedure well. The rod was removed in its entirety. The incision was dressed with a small adhesive bandage closure and a pressure dressing was applied. An alternate plan for contraception was discussed. The patient would like to use nothing for her contraception.   Chronic Pelvic pain: Discussed briefly as the main reason for visit is nexplanon removal. Upreg negative. Pelvic U/S scheduled. I will contact her with test result. F/U with PCP for further management and other concerns.

## 2021-10-19 ENCOUNTER — Ambulatory Visit
Admission: RE | Admit: 2021-10-19 | Discharge: 2021-10-19 | Disposition: A | Discharge status: Home / Self Care | Payer: BC Managed Care – PPO | Source: Ambulatory Visit | Attending: Family Medicine | Admitting: Family Medicine

## 2021-10-19 ENCOUNTER — Telehealth: Payer: Self-pay | Admitting: Family Medicine

## 2021-10-19 DIAGNOSIS — N838 Other noninflammatory disorders of ovary, fallopian tube and broad ligament: Secondary | ICD-10-CM | POA: Diagnosis not present

## 2021-10-19 DIAGNOSIS — G8929 Other chronic pain: Secondary | ICD-10-CM | POA: Diagnosis not present

## 2021-10-19 DIAGNOSIS — D259 Leiomyoma of uterus, unspecified: Secondary | ICD-10-CM | POA: Diagnosis not present

## 2021-10-19 DIAGNOSIS — N83291 Other ovarian cyst, right side: Secondary | ICD-10-CM | POA: Diagnosis not present

## 2021-10-19 DIAGNOSIS — R102 Pelvic and perineal pain: Secondary | ICD-10-CM

## 2021-10-19 DIAGNOSIS — N83201 Unspecified ovarian cyst, right side: Secondary | ICD-10-CM

## 2021-10-19 NOTE — Telephone Encounter (Signed)
Result discussed. Benign ovarian cyst with uterine fibroid. Due to her chronic recurrent pelvic pain she opted for a second opinion from a gynecologist. Referral placed.

## 2021-12-22 ENCOUNTER — Ambulatory Visit (INDEPENDENT_AMBULATORY_CARE_PROVIDER_SITE_OTHER): Payer: BC Managed Care – PPO | Admitting: Family Medicine

## 2021-12-22 ENCOUNTER — Other Ambulatory Visit (HOSPITAL_COMMUNITY)
Admission: RE | Admit: 2021-12-22 | Discharge: 2021-12-22 | Disposition: A | Discharge status: Home / Self Care | Payer: Medicaid Other | Source: Ambulatory Visit | Attending: Family Medicine | Admitting: Family Medicine

## 2021-12-22 ENCOUNTER — Other Ambulatory Visit: Payer: Self-pay

## 2021-12-22 VITALS — BP 118/68 | HR 90 | Ht 64.0 in | Wt 219.6 lb

## 2021-12-22 DIAGNOSIS — Z113 Encounter for screening for infections with a predominantly sexual mode of transmission: Secondary | ICD-10-CM | POA: Insufficient documentation

## 2021-12-22 NOTE — Patient Instructions (Signed)
It was great to meet you!  Today we did routine STD testing. This includes gonorrhea, chlamydia, trichomonas, HIV, syphilis, and herpes (HSV). We will send you a MyChart message with the results or call if they are abnormal.   We also checked for yeast and bacterial overgrowth which are not sexually transmitted.  Please think about what method you'd like to use for contraception if you do not desire pregnancy. www.[email or web address].org is a great resources to read more about the different options. Let us know what you decide.  Make an appointment with Dr. Susa Simmonds to discuss your concerns about anemia.  Take care and seek immediate care sooner if you develop any concerns.  Dr. [facility]

## 2021-12-22 NOTE — Assessment & Plan Note (Signed)
-  GC/chlamydia/trich obtained today -HIV, RPR and HSV serology also obtained -per her request will check for yeast and BV as well -discussed contraception options and patient was given resources in AVS to read more. She will let our office know what she decides

## 2021-12-22 NOTE — Progress Notes (Signed)
° ° °  SUBJECTIVE:   CHIEF COMPLAINT / HPI:   STD Testing Patient presents for STD testing. She endorses some white vaginal discharge and odor before her cycle started but does not feel these are abnormal for her. Denies urinary symptoms including dysuria, frequency, urgency. No dyspareunia. She requests routine testing because her current partner is bisexual and his last partner was a female who reportedly has herpes. Otherwise no known exposures. Patient denies prior hx of STDs. She is not using any form of contraception currently. Does not desire pregnancy at this time. Currently on her menstrual cycle. Reports she tried Nexplanon in the past but it messed with her mood, hair, etc. UTD on pap.  OBJECTIVE:   BP 118/68    Pulse 90    Ht 5\' 4"  (1.626 m)    Wt 219 lb 9.6 oz (99.6 kg)    LMP: currently on menstrual cycle    SpO2 98%    BMI 37.69 kg/m   General: NAD, pleasant, able to participate in exam Respiratory: No respiratory distress Skin: warm and dry, no rashes noted Psych: Normal affect and mood Neuro: grossly intact GU/GYN: Exam performed in the presence of a chaperone. External genitalia within normal limits.  Vaginal mucosa pink, moist, normal rugae.  Nonfriable cervix without lesions. Small amount of bleeding on speculum exam (patient on her period).   ASSESSMENT/PLAN:   Screen for STD (sexually transmitted disease) -GC/chlamydia/trich obtained today -HIV, RPR and HSV serology also obtained -per her request will check for yeast and BV as well -discussed contraception options and patient was given resources in AVS to read more. She will let our office know what she decides     Alcus Dad, MD [place]

## 2021-12-22 NOTE — Assessment & Plan Note (Signed)
>>  ASSESSMENT AND PLAN FOR SCREEN FOR STD (SEXUALLY TRANSMITTED DISEASE) WRITTEN ON 12/22/2021  3:59 PM BY Alcus Dad, MD  -GC/chlamydia/trich obtained today -HIV, RPR and HSV serology also obtained -per her request will check for yeast and BV as well -discussed contraception options and patient was given resources in AVS to read more. She will let our office know what she decides

## 2021-12-23 LAB — CERVICOVAGINAL ANCILLARY ONLY
Bacterial Vaginitis (gardnerella): POSITIVE — AB
Candida Glabrata: NEGATIVE
Candida Vaginitis: NEGATIVE
Chlamydia: NEGATIVE
Comment: NEGATIVE
Comment: NEGATIVE
Comment: NEGATIVE
Comment: NEGATIVE
Comment: NEGATIVE
Comment: NORMAL
Neisseria Gonorrhea: NEGATIVE
Trichomonas: NEGATIVE

## 2021-12-23 LAB — HSV(HERPES SIMPLEX VRS) I + II AB-IGG
HSV 1 Glycoprotein G Ab, IgG: <0.91 index (ref 0.00–0.90)
HSV 2 IgG, Type Spec: <0.91 index (ref 0.00–0.90)

## 2021-12-23 LAB — HIV ANTIBODY (ROUTINE TESTING W REFLEX): HIV Screen 4th Generation wRfx: NONREACTIVE

## 2021-12-23 LAB — RPR: RPR Ser Ql: NONREACTIVE

## 2021-12-26 ENCOUNTER — Other Ambulatory Visit: Payer: Self-pay | Admitting: Family Medicine

## 2021-12-26 MED ORDER — METRONIDAZOLE 500 MG PO TABS: 500.0000 mg | ORAL_TABLET | Freq: Two times a day (BID) | ORAL | 0 refills | Status: DC

## 2022-01-11 ENCOUNTER — Other Ambulatory Visit: Payer: Self-pay

## 2022-01-11 ENCOUNTER — Ambulatory Visit (INDEPENDENT_AMBULATORY_CARE_PROVIDER_SITE_OTHER): Payer: Self-pay | Admitting: Family Medicine

## 2022-01-11 ENCOUNTER — Other Ambulatory Visit (HOSPITAL_COMMUNITY)
Admission: RE | Admit: 2022-01-11 | Discharge: 2022-01-11 | Disposition: A | Discharge status: Home / Self Care | Payer: Medicaid Other | Source: Ambulatory Visit | Attending: Family Medicine | Admitting: Family Medicine

## 2022-01-11 ENCOUNTER — Encounter: Payer: Self-pay | Admitting: Family Medicine

## 2022-01-11 VITALS — BP 131/74 | HR 74 | Wt 219.6 lb

## 2022-01-11 DIAGNOSIS — Z113 Encounter for screening for infections with a predominantly sexual mode of transmission: Secondary | ICD-10-CM | POA: Insufficient documentation

## 2022-01-11 NOTE — Patient Instructions (Signed)
Be sure to keep your belly moisturized to prevent itching and future stretch marks.  It is normal for your feet to go somewhat numb after several minutes of having them crossed.  If this continues for 20 minutes after you sit or uncross your legs please let me know.   Follow up with me as schedule to address your remaining concerns.   It was great seeing you today!  Regarding lab work today:  Due to recent changes in healthcare laws, you may see the results of your imaging and laboratory studies on MyChart before your provider has had a chance to review them.  I understand that in some cases there may be results that are confusing or concerning to you. Not all laboratory results come back in the same time frame and you may be waiting for multiple results in order to interpret others.  Please give [place] 72 hours in order for your provider to thoroughly review all the results before contacting the office for clarification of your results. If everything is normal, you will get a letter in the mail or a message in My Chart. Please give [place] a call if you do not hear from [place] after 2 weeks.  Feel free to call with any questions or concerns at any time, at [phone].   Take care,  Dr. Rushie Chestnut [facility]

## 2022-01-11 NOTE — Progress Notes (Signed)
° °  SUBJECTIVE:   CHIEF COMPLAINT / HPI:   Chief Complaint  Patient presents with   Follow-up     Christy Barnes is a 26 y.o. female here for STD testing.  She inconsistently uses condoms with her partner.  Does not take hormonal birth control.  Would like to have a child in the next 2 to 3 years. LMP 12/21/21.    Patient with other concerns including: -Noticed abdominal irritation around her bellybutton that itches.  Denies rash.  -Also states that her feet have been numb and tingling after sitting for 30 minutes to an hour on them.  -Would like testing for autoimmune disorders.  -Sensation of being winded in the middle of her chest.  Denies chest pain.  PERTINENT  PMH / PSH: reviewed and updated as appropriate   OBJECTIVE:   BP 131/74    Pulse 74    Wt 219 lb 9.6 oz (99.6 kg)    SpO2 100%    BMI 37.69 kg/m    GEN: well appearing female in no acute distress  CVS: well perfused  RESP: speaking in full sentences without pause  ABD: soft, non-tender, non-distended, no palpable masses,  Pelvic exam: normal external genitalia, vulva, VAGINA and CERVIX: normal appearing cervix without discharge or lesions, nontender and no masses, GC/CT collected, exam chaperoned by CMA Alexis.    ASSESSMENT/PLAN:   Encounter for screening for sexually transmitted diseases   High risk sexual behavior STI testing and including HIV and RPR today.  Previously tested negative last month.  Advised consistent use of condoms.  Discussed hormonal birth control options today.  Patient is undecided and would like to time to research options.  Abdominal irritation Bellybutton explored with sterile swab.  No irritation or rash noted.  Does have striae which may be the cause of her irritation/itching.  Advised patient to keep abdomen moisturize well.  Bilateral foot numbness and tingling Doubt pathologic process given symptoms resolved after releasing the pressure placed on them after 30 minutes to an  hour.  Reassurance provided.  Patient scheduled for follow-up appointment to discuss hormonal birth control and address additional complaints that she had brought up today.   [name], DO PGY-3, [facility] 01/12/2022

## 2022-01-12 ENCOUNTER — Encounter: Payer: Self-pay | Admitting: Family Medicine

## 2022-01-12 LAB — HIV ANTIBODY (ROUTINE TESTING W REFLEX): HIV Screen 4th Generation wRfx: NONREACTIVE

## 2022-01-13 LAB — CERVICOVAGINAL ANCILLARY ONLY
Chlamydia: NEGATIVE
Comment: NEGATIVE
Comment: NEGATIVE
Comment: NORMAL
Neisseria Gonorrhea: NEGATIVE
Trichomonas: NEGATIVE

## 2022-01-13 LAB — T PALLIDUM ANTIBODY, EIA: T pallidum Antibody, EIA: NEGATIVE

## 2022-01-13 LAB — RPR W/REFLEX TO TREPSURE: RPR: NONREACTIVE

## 2022-01-19 ENCOUNTER — Ambulatory Visit (INDEPENDENT_AMBULATORY_CARE_PROVIDER_SITE_OTHER): Payer: Self-pay | Admitting: Family Medicine

## 2022-01-19 ENCOUNTER — Other Ambulatory Visit: Payer: Self-pay

## 2022-01-19 VITALS — BP 132/82 | HR 92 | Ht 64.0 in | Wt 218.8 lb

## 2022-01-19 DIAGNOSIS — F419 Anxiety disorder, unspecified: Secondary | ICD-10-CM | POA: Insufficient documentation

## 2022-01-19 DIAGNOSIS — F32A Depression, unspecified: Secondary | ICD-10-CM | POA: Insufficient documentation

## 2022-01-19 DIAGNOSIS — L649 Androgenic alopecia, unspecified: Secondary | ICD-10-CM | POA: Insufficient documentation

## 2022-01-19 DIAGNOSIS — L659 Nonscarring hair loss, unspecified: Secondary | ICD-10-CM | POA: Diagnosis not present

## 2022-01-19 MED ORDER — MINOXIDIL 5 % EX SOLN
1.0000 mL | Freq: Two times a day (BID) | CUTANEOUS | 0 refills | Status: DC
Start: 2022-01-19 — End: 2023-04-18

## 2022-01-19 NOTE — Assessment & Plan Note (Signed)
Provided with list of therapists/counselors to contact. Should follow up with PCP for consideration of initiating medication if appropriate.  ?

## 2022-01-19 NOTE — Assessment & Plan Note (Signed)
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 She does have a history of thyroid disorder in her family, it is worth checking a TSH. No history of lupus in her family or other signs of Lupus or other autoimmune conditions- will hold off on other auto-immune labs. Though she does admit to being under significant stress, doubt telogen effluvium at this time.  ?-Provided patient handout on Androgenic Alopecia ?-Recommended to try topical Minoxidil ?-Check TSH ?

## 2022-01-19 NOTE — Progress Notes (Signed)
? ? ?  SUBJECTIVE:  ? ?CHIEF COMPLAINT / HPI:  ? ?Hair Loss ?Christy Barnes is a 26 y.o. female who presents to the [facility] dermatology clinic for concerns about hair loss. States that it first started when she was a senior in high school- about 10 years ago. Notes it got so severe that in 2016-2017 she shaved her hair off completely. In 2020 her hair was starting to grown back and was full so she started re-twisting her hair. States that it was doing well but in the last 2 months it started falling out again. She is wondering if it is stress-related. 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 Notes Fhx of hair loss in both males and females in her family. Also notes history of thyroid disorder in her mother and maternal uncle. ? ?Her scalp is itchy. Not painful. She was provided with prescriptions in the past- she doesn't recall what it called.  ?Recently started doing protein treatments which seem to help a little.  ? ?Itchy around belly button- otherwise no rashes. No joint pains. No SOB. Intermittent chest pains- "it feels like a little bruise", it is central. Hurts with palpation. Lasts a few days. No Fhx of lupus that she is aware of. Feels that she may be constipated. Feels cold all the time. No unintentional weight loss or gain.  ? ? ?Depression, Anxiety ?Patient does note history of depression and anxiety. She is not on any medications for this. She is interested in seeing a therapist. Would like resources. ? ?PERTINENT  PMH / PSH:  ?Past Medical History:  ?Diagnosis Date  ? Labial tear freshman year  ? Overweight and obesity(278.0)   ? Sickle cell trait ([place]) 06/07/2017  ? ? ? ?OBJECTIVE:  ? ?BP 132/82   Pulse 92   Ht 5\' 4"  (1.626 m)   Wt 218 lb 12.8 oz (99.2 kg)   LMP 01/19/2022 (Exact Date)   SpO2 100%   BMI 37.56 kg/m?   ? ?General: NAD, pleasant, able to participate in exam ?Skin: warm and dry, two comodones to forehead. Scalp is normal in appearance without scarring. There is patchy  areas of hair loss in frontal and vertex of head.  ?Psych: Normal affect and mood ? ? ? ? ? ? ?ASSESSMENT/PLAN:  ? 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 She does have a history of thyroid disorder in her family, it is worth checking a TSH. No history of lupus in her family or other signs of Lupus or other autoimmune conditions- will hold off on other auto-immune labs. Though she does admit to being under significant stress, doubt telogen effluvium at this time.  ?-Provided patient handout on Androgenic Alopecia ?-Recommended to try topical Minoxidil ?-Check TSH ? ?Anxiety and depression ?Provided with list of therapists/counselors to contact. Should follow up with PCP for consideration of initiating medication if appropriate.  ?  ? ? ?Sharion Settler, DO ?[address]  ? ?

## 2022-01-19 NOTE — Patient Instructions (Addendum)
It was wonderful to see you today. ? ?Today we talked about: ? ?-We suspect you likely have something called Androgenic Alopecia. Essentially, this is accelerated normal hair loss.  ?-You can buy Minoxidil (Brand name is Rogaine). Use this daily on your hair. This should help with regrowth. If you stop using it, your hair may start to fall out again.  ?-We have sent a prescription. If it is cheaper for you to buy over the counter, please do so. ?-We are checking your thyroid level. We will contact you through MyChart with the results.  ?-See below for resources for therapy and counseling.  ? ?Thank you for choosing [facility].  ? ?Please call [phone] with any questions about today's appointment. ? ?Please be sure to schedule follow up at the front  desk before you leave today.  ? ?Sharion Settler, DO ?PGY-2 Family Medicine   ? ?Androgenetic alopecia ? ?Androgenetic alopecia the most common cause of hair loss. This hair loss is genetically programmed. Men may see a receding hair line or thinning or baldness on the top of their scalp. Women tend to have a widening part and loss of hair at the top of their scalp. Men may take oral medication such as Propecia or may use a topical medication like minoxidil (Rogaine) to treat the condition. Women may also use the topical medication minoxidil. Some physicians may treat women with spironolactone, a fluid pill, which can lower female hormone levels subsequently causing regrowth of hair or slowing of the hair loss. ? ? ? ?Therapy and Counseling Resources ?Most providers on this list will take Medicaid. Patients with commercial insurance or Medicare should contact their insurance company to get a list of in network providers. ? ?[facility] (spanish speaking therapist available)(habla espanol)  ?[place], [place], [place] [address], [place] ?[email or web address] ?[phone] ? ?[facility] ?[address]. [place], [place] [address] ?[phone] ? ?[facility] ? [address], [place], [place] [address]      [phone] ? ?[facility] ?[place], [place] [address] ?[phone] ? ?[facility] ?[address], [place], [place] [address]       [phone]    ? ?[facility] (virtual only) ?[phone] ? ?[facility] ?[address], [place], [place] ? ?[facility]:  [address] [place] ? ?[facility]:  ?[place] [phone] ? ?[facility] (under & uninsured) ?[address], [place] [phone]    [email or web address]   ? ?[facility] ?[place] [address]  [place]    [phone] ? ?[facility] ?[place]     Phone:  [phone]     [place]  [phone]  ? ?[place] ?[address] [place], [place] [phone] ? ?[facility]: [place], [place], [place]  ? ?[place] (Jet therapist) [email or web address]  ?[place] [address]   [place]  [address]    [phone]   ? ?[facility]   ?[address]. [place],  [place], [place]  ? ?[facility] [place]  ?[address] [place]  [phone] ? ?[facility]  ?[place], [place]        (  [phone] ? ?Open Access/Walk In Clinic under & uninsured ? ?[facility]  ?[place], [place] ?[place] [phone] ?[place] [phone] ? ?[facility],  ?([place])   [place]: [phone]) 8:30 - 12; 1 - 2:30 ? ?[facility] [place],  ?[address] [place]    [phone]):8:30 - 12; 2 - 3PM ? ?[facility],   ?[address],  [place]; [phone]):   Mon - Fri 8 AM - 5 PM ? ?[facility] ?[place]  MWF 12:30 to 3:00 or call to schedule an appointment  [phone] ? ?Specific Provider options ?Psychology Today  [email or web address] ?click on find a therapist  ?enter your zip code ?left side and select or tailor a therapist for your specific need.  ? ?[facility] Provider Directory ?[email or web address]  (Medicaid)   Follow all drop down to find a provider ? ?Social Support program ?[place] ?336) H3156881 or [email or web address] ?[address], [place], [place] Recovery support and educational  ? ?24- Hour Availability:  ? ?[facility]  ?[place], [place] ?[place] [phone] ?[place] [phone] ? ?[facility] [employer] [phone] ? ?[facility]  [phone]  ? ?[place]  [phone] (after hours) ? ?Therapeutic Alternative/Mobile Crisis   [phone] ? ?[place] National Suicide Hotline  [phone] Diamantina Monks) ? ?Call 911 or go to emergency room ? ?[employer]  [phone]);  [place] and [place]  ? ?[facility] ACCESS  ?([phone]); [place], [place], [place], [place], [place], [place], [place] ? ?

## 2022-01-20 LAB — TSH: TSH: 1.49 u[IU]/mL (ref 0.450–4.500)

## 2022-01-24 ENCOUNTER — Other Ambulatory Visit: Payer: Self-pay

## 2022-01-24 ENCOUNTER — Ambulatory Visit (INDEPENDENT_AMBULATORY_CARE_PROVIDER_SITE_OTHER): Payer: Self-pay | Admitting: Family Medicine

## 2022-01-24 ENCOUNTER — Encounter: Payer: Self-pay | Admitting: Family Medicine

## 2022-01-24 VITALS — BP 102/70 | HR 98 | Ht 64.0 in | Wt 217.4 lb

## 2022-01-24 DIAGNOSIS — R6889 Other general symptoms and signs: Secondary | ICD-10-CM

## 2022-01-24 DIAGNOSIS — R202 Paresthesia of skin: Secondary | ICD-10-CM

## 2022-01-24 DIAGNOSIS — F410 Panic disorder [episodic paroxysmal anxiety] without agoraphobia: Secondary | ICD-10-CM

## 2022-01-24 DIAGNOSIS — R102 Pelvic and perineal pain: Secondary | ICD-10-CM

## 2022-01-24 NOTE — Progress Notes (Addendum)
? ?  SUBJECTIVE:  ? ?CHIEF COMPLAINT / HPI:  ? ?Christy Barnes is a 26 y.o. female here for : ? ?She is concerned she could be anemic.  Her periods last 5 days (2 light flow and three medium flow days).  ? ?States they her feet and hands always feel cold. Feels like it is getting worse as "it is happening a lil more frequent." She is unable to sleep if she doesn't have socks on. Feet tingle at night. This is decreasing her sleep quality. Reports feeling always tired.  ? ?Had a panic attack last Thursday. Last panic attack was about 2 years ago. Takes nothing for this. Does not follow with a psychologist as she is not interested in medications.  ? ?Would like to be tested for ADHD and ASD.  ? ?No longer feels winded.  ? ?Has ongoing pelvic pain. Pain present for at least 2 years. Reports known ovarian cyst and fibroids. Would like to see a gynecologist  ? ? ?PERTINENT  PMH / PSH: reviewed and updated as appropriate  ? ?OBJECTIVE:  ? ?BP 102/70   Pulse 98   Ht '5\' 4"'$  (1.626 m)   Wt 217 lb 6 oz (98.6 kg)   LMP 01/19/2022 (Exact Date)   SpO2 98%   BMI 37.31 kg/m?   ? ?GEN: pleasant well appearing female, in no acute distress  ?CV: regular rate and rhythm ?RESP: no increased work of breathing, clear to ascultation bilaterally ?ABD: Bowel sounds present. Soft, non-tender, non-distended, no palpable masses ?MSK: strength b/l ankle and knee 5/5, gross sensation intact to light and sharp sensation ?SKIN: warm, dry ?PSYCH: Normal affect, appropriate speech and behavior  ? ? ?ASSESSMENT/PLAN:  ? ?Pelvic pressure in female ?Reviewed previous pelvic ultrasounds (09/2021, 07/2019).  She has known fundal mass (2cm x 2cm) requested referral to gynecology.  [facility].  Provided contact information to schedule an appointment. Consider repeat pregnancy test at follow up.  ? ?Panic attacks ?Uncontrolled. She is not interested in medications at this time. She is interested in therapy.  Resources provided.    PHQ9: score 10. History of MDD.  GAD7: 15.  Her increased anxiety may be affecting her health as she presents with several somatic concerns. Defer to psychiatrist for ADHD and ASD testing.  ?  ? ?Concern for anemia / Tingling in feet ?History of abnormal uterine bleeding. On chart review, Hgb ranges 13.4-15.1.  No value since 2019. Obtain CBC, ferritin, iron.   ? ?Encounter to discuss birth control ?Pt undecided. She has not thought about hormonal contraception. Recent STD testing normal.  ? ? ? ?[name], DO ?PGY-3, [facility] ?01/24/2022  ? ? ? ? ? ? ? ? ?

## 2022-01-24 NOTE — Patient Instructions (Addendum)
For they numbness feeling in your feet/hands: ?We checked your iron and blood levels today to see if you are anemic.  ? ?For your pelvic pain: ?Schedule an appointment with your gynecologist ?Address: [address] [place], [place] [address] ?Phone: [phone] ? ?For therapy contact one of the below :  ? ?[facility] (also can be tested by psychiatry for ADHD) ?[place], [place] ?[place] [phone] ?[place] [phone] ? ?[facility]  ?[place], [place]     Phone:  [phone] ? ?[facility]  [email or web address] ?click on find a therapist  ?enter your zip code ?left side and select or tailor a therapist for your specific need.  ? ? ?Remember to take a multivitamin daily!  Consider starting hormonal birth control.  ? ? ? ?

## 2022-01-24 NOTE — Assessment & Plan Note (Signed)
Uncontrolled. She is not interested in medications at this time. She is interested in therapy.  Resources provided.   PHQ9: score 10. History of MDD.  GAD7: 15.  Her increased anxiety may be affecting her health as she presents with several somatic concerns. Defer to psychiatrist for ADHD and ASD testing.  ?

## 2022-01-24 NOTE — Assessment & Plan Note (Addendum)
Reviewed previous pelvic ultrasounds (09/2021, 07/2019).  She has known fundal mass (2cm x 2cm) requested referral to gynecology.  Follow[facility] with [facility].  Provided contact information to schedule an appointment. Consider repeat pregnancy test at follow up.  ?

## 2022-01-25 LAB — CBC
Hematocrit: 42.8 % (ref 34.0–46.6)
Hemoglobin: 14.5 g/dL (ref 11.1–15.9)
MCH: 32.2 pg (ref 26.6–33.0)
MCHC: 33.9 g/dL (ref 31.5–35.7)
MCV: 95 fL (ref 79–97)
Platelets: 292 10*3/uL (ref 150–450)
RBC: 4.51 x10E6/uL (ref 3.77–5.28)
RDW: 12 % (ref 11.7–15.4)
WBC: 4.3 10*3/uL (ref 3.4–10.8)

## 2022-01-25 LAB — FERRITIN: Ferritin: 47 ng/mL (ref 15–150)

## 2022-01-25 LAB — IRON: Iron: 111 ug/dL (ref 27–159)

## 2022-02-01 ENCOUNTER — Ambulatory Visit: Payer: Medicaid Other

## 2022-02-03 ENCOUNTER — Ambulatory Visit (INDEPENDENT_AMBULATORY_CARE_PROVIDER_SITE_OTHER): Payer: Medicaid Other | Admitting: Family Medicine

## 2022-02-03 ENCOUNTER — Other Ambulatory Visit: Payer: Self-pay

## 2022-02-03 ENCOUNTER — Other Ambulatory Visit (HOSPITAL_COMMUNITY)
Admission: RE | Admit: 2022-02-03 | Discharge: 2022-02-03 | Disposition: A | Discharge status: Home / Self Care | Payer: Medicaid Other | Source: Ambulatory Visit | Attending: Family Medicine | Admitting: Family Medicine

## 2022-02-03 ENCOUNTER — Ambulatory Visit: Payer: Self-pay

## 2022-02-03 VITALS — BP 116/72 | HR 88 | Ht 64.0 in | Wt 217.2 lb

## 2022-02-03 DIAGNOSIS — Z3200 Encounter for pregnancy test, result unknown: Secondary | ICD-10-CM | POA: Diagnosis not present

## 2022-02-03 DIAGNOSIS — N898 Other specified noninflammatory disorders of vagina: Secondary | ICD-10-CM

## 2022-02-03 NOTE — Assessment & Plan Note (Addendum)
26 y.o. female with vaginal discharge, previously positive for BV in February. She is sexually active with no condom use or BCP- counseling given. Patient is interested in STI screening.  She has had regular periods, no intermenstrual bleeding. She is UTD with pap in 2021, NILM. Next pap due 2024. Reviewed last note for similar, she does not report any pelvic pain today, hgb stable from last visit, will obtain pregnancy test today. ?Plan: ?-GC/chlamydia, wet prep pending ?-Will check HIV and RPR ?-Pregnancy test pending ?

## 2022-02-03 NOTE — Patient Instructions (Addendum)
It was a pleasure to see you today! ? ?We will get some labs today.  If they are abnormal or we need to do something about them, I will call you.  If they are normal, I will send you a message on MyChart (if it is active) or a letter in the mail.  If you don't hear from [place] in 2 weeks, please call the office  [phone]. ? ? ?Be Well, ? ?Dr. Chauncey Reading ? ?

## 2022-02-03 NOTE — Progress Notes (Signed)
? ? ?  SUBJECTIVE:  ? ?CHIEF COMPLAINT / HPI:  ? ?Vaginal Discharge: ?Patient is a 26 y.o. female presenting with vaginal discharge for several days.  She states the discharge is of thin, grey-white consistency.  She endorses slight vaginal odor.  She is interested in screening for sexually transmitted infections today. LMP 2/28-01/21/22, regular. She has had unprotected intercourse since then. She is not on birthcontrol and is not interested in birth control. ? ?PERTINENT  PMH / PSH: None relevant ? ?OBJECTIVE:  ? ?BP 116/72   Pulse 88   Ht '5\' 4"'$  (1.626 m)   Wt 217 lb 4 oz (98.5 kg)   LMP 01/19/2022 (Exact Date)   SpO2 98%   BMI 37.29 kg/m?   ? ?General: NAD, pleasant, able to participate in exam ?Respiratory: Normal effort, no obvious respiratory distress ?Pelvic: VULVA: normal appearing vulva with no masses, tenderness or lesions, VAGINA: Normal appearing vagina with normal color, no lesions, with scant discharge present, CERVIX: No lesions, mucoid discharge present ? ?ASSESSMENT/PLAN:  ? ?Vaginal discharge ?26 y.o. female with vaginal discharge, previously positive for BV in February. She is sexually active with no condom use or BCP- counseling given. Patient is interested in STI screening.  She has had regular periods, no intermenstrual bleeding. She is UTD with pap in 2021, NILM. Next pap due 2024. Reviewed last note for similar, she does not report any pelvic pain today, hgb stable from last visit, will obtain pregnancy test today. ?Plan: ?-GC/chlamydia, wet prep pending ?-Will check HIV and RPR ?-Pregnancy test pending ? ?Gladys Damme, MD ?[place]  ? ? ?

## 2022-02-04 LAB — RPR: RPR Ser Ql: NONREACTIVE

## 2022-02-04 LAB — PREGNANCY, URINE: Preg Test, Ur: NEGATIVE

## 2022-02-04 LAB — HIV ANTIBODY (ROUTINE TESTING W REFLEX): HIV Screen 4th Generation wRfx: NONREACTIVE

## 2022-02-07 ENCOUNTER — Other Ambulatory Visit: Payer: Self-pay | Admitting: Family Medicine

## 2022-02-07 DIAGNOSIS — B9689 Other specified bacterial agents as the cause of diseases classified elsewhere: Secondary | ICD-10-CM

## 2022-02-07 LAB — CERVICOVAGINAL ANCILLARY ONLY
Bacterial Vaginitis (gardnerella): POSITIVE — AB
Candida Glabrata: NEGATIVE
Candida Vaginitis: NEGATIVE
Chlamydia: NEGATIVE
Comment: NEGATIVE
Comment: NEGATIVE
Comment: NEGATIVE
Comment: NEGATIVE
Comment: NEGATIVE
Comment: NORMAL
Neisseria Gonorrhea: NEGATIVE
Trichomonas: NEGATIVE

## 2022-02-07 MED ORDER — METRONIDAZOLE 500 MG PO TABS: 500.0000 mg | ORAL_TABLET | Freq: Two times a day (BID) | ORAL | 0 refills | Status: DC

## 2022-02-07 NOTE — Progress Notes (Signed)
BV tx flagyl 500 mg BID x7d sent. ?

## 2022-02-21 ENCOUNTER — Other Ambulatory Visit (HOSPITAL_COMMUNITY)
Admission: RE | Admit: 2022-02-21 | Discharge: 2022-02-21 | Disposition: A | Discharge status: Home / Self Care | Payer: Medicaid Other | Source: Ambulatory Visit | Attending: Family Medicine | Admitting: Family Medicine

## 2022-02-21 ENCOUNTER — Other Ambulatory Visit: Payer: Self-pay

## 2022-02-21 ENCOUNTER — Ambulatory Visit (INDEPENDENT_AMBULATORY_CARE_PROVIDER_SITE_OTHER): Payer: Self-pay | Admitting: Family Medicine

## 2022-02-21 VITALS — BP 114/73 | HR 87 | Wt 221.0 lb

## 2022-02-21 DIAGNOSIS — N949 Unspecified condition associated with female genital organs and menstrual cycle: Secondary | ICD-10-CM | POA: Diagnosis not present

## 2022-02-21 LAB — POCT URINE PREGNANCY: Preg Test, Ur: NEGATIVE

## 2022-02-21 NOTE — Progress Notes (Signed)
? ? ?  SUBJECTIVE:  ? ?CHIEF COMPLAINT / HPI:  ? ?Vaginal discomfort ?Ongoing for 1 month following removal of Nexplanon.  Patient states she has had frequent BV and yeast.  Her period was also 4 days late with cramping and heavier bleeding.  She is currently menstruating.  She describes internal vaginal discomfort.  Endorses dyspareunia during her last sexual encounter.  States she does not have a prior history of this.  Denies dysuria.  She desires STD testing at this time.  She declines birth control. ? ?PERTINENT  PMH / PSH: As above. ? ?OBJECTIVE:  ? ?BP 114/73   Pulse 87   Wt 221 lb (100.2 kg)   SpO2 100%   BMI 37.93 kg/m?   ?General: Appears well, no acute distress. Age appropriate. ?Respiratory: normal effort ?Pelvic exam: normal external genitalia, vulva, vagina, cervix, uterus and adnexa, VAGINA: normal appearing vagina with normal color and discharge, no lesions, vaginal discharge - bloody, WET MOUNT done - results: pending, DNA probe for chlamydia and GC obtained, CERVIX: normal appearing cervix without discharge or lesions, cervical discharge present - bloody. ?Neuro: alert and oriented ?Psych: normal affect ? ?ASSESSMENT/PLAN:  ? ?Vaginal discomfort ?1 month of vaginal irritation and history of dyspareunia.  No obvious structural abnormalities in swabs for infection are pending.  UPT is negative.  Discussed possible vaginal dryness as possible culprit to dyspareunia at this time.  We will follow-up STD testing and can further discuss mechanical causes following results.  Encouraged to follow-up for other form of birth control if pregnancy not desired. ?- POCT urine pregnancy ?- Cervicovaginal ancillary only ?- HIV antibody (with reflex); Future ?- RPR; Future ? ?Siyon Linck Autry-Lott, DO ?[place]  ?

## 2022-02-21 NOTE — Patient Instructions (Signed)
I will notify you of results when available via MyChart if normal.  If abnormal I will give you a call and discuss treatment.  At your earliest convenience please return to our lab to get your blood draw between the hours of 830-12 PM and 130-4PM. ? ?Please consider other birth control options if pregnancy not desired. ? ?Dr. Janus Molder ? ?

## 2022-02-23 LAB — CERVICOVAGINAL ANCILLARY ONLY
Bacterial Vaginitis (gardnerella): NEGATIVE
Candida Glabrata: NEGATIVE
Candida Vaginitis: NEGATIVE
Chlamydia: NEGATIVE
Comment: NEGATIVE
Comment: NEGATIVE
Comment: NEGATIVE
Comment: NEGATIVE
Comment: NEGATIVE
Comment: NORMAL
Neisseria Gonorrhea: NEGATIVE
Trichomonas: NEGATIVE

## 2022-04-25 ENCOUNTER — Encounter: Payer: Self-pay | Admitting: *Deleted

## 2022-06-05 ENCOUNTER — Ambulatory Visit (INDEPENDENT_AMBULATORY_CARE_PROVIDER_SITE_OTHER): Payer: Self-pay | Admitting: Student

## 2022-06-05 ENCOUNTER — Other Ambulatory Visit (HOSPITAL_COMMUNITY)
Admission: RE | Admit: 2022-06-05 | Discharge: 2022-06-05 | Disposition: A | Discharge status: Home / Self Care | Payer: Medicaid Other | Source: Ambulatory Visit | Attending: Family Medicine | Admitting: Family Medicine

## 2022-06-05 ENCOUNTER — Encounter: Payer: Self-pay | Admitting: Student

## 2022-06-05 VITALS — BP 128/79 | HR 97 | Resp 18 | Wt 219.2 lb

## 2022-06-05 DIAGNOSIS — Z113 Encounter for screening for infections with a predominantly sexual mode of transmission: Secondary | ICD-10-CM | POA: Insufficient documentation

## 2022-06-05 LAB — POCT WET PREP (WET MOUNT)
Clue Cells Wet Prep Whiff POC: NEGATIVE
Trichomonas Wet Prep HPF POC: ABSENT
WBC, Wet Prep HPF POC: NONE SEEN

## 2022-06-05 NOTE — Assessment & Plan Note (Signed)
>>  ASSESSMENT AND PLAN FOR SCREEN FOR STD (SEXUALLY TRANSMITTED DISEASE) WRITTEN ON 06/05/2022  2:34 PM BY DAMERON, MARISA, DO  GC/Chlamydia/trich, yeast and BV obtained today RPR, HIV, Hepatitis C serology also obtained Discussed contraception options as she is not trying to conceive Will f/u on results and let patient know if treatment warranted

## 2022-06-05 NOTE — Patient Instructions (Signed)
It was great to see you! Thank you for allowing me to participate in your care!  We completed screening today for STIs. I will call you if anything is abnormal.  Take care and seek immediate care sooner if you develop any concerns.   Dr. Orvis Brill, [place]

## 2022-06-05 NOTE — Progress Notes (Signed)
    SUBJECTIVE:   CHIEF COMPLAINT / HPI:   Christy Barnes presents for STI screening.  LMP: Ended July 7th Birth control: None, not trying to get pregnant and also not interested in birth control today. No change in vaginal discharge, bleeding, odor. Sexually active with a partner.  PERTINENT  PMH / PSH: Reviewed  OBJECTIVE:   BP 128/79   Pulse 97   Resp 18   Wt 219 lb 4 oz (99.5 kg)   LMP 05/26/2022   SpO2 100%   BMI 37.63 kg/m   General: Well appearing, pleasant, no distress CV: RRR Resp: Normal WOB on room air GU: External vulva and vagina nonerythematous, without any obvious lesions or rash. Scant clear-white discharge appreciated.  Normal ruggae of vaginal walls.  Cervix is non erythematous and non-friable.  There is no cervical motion tenderness, masses or gross abnormalities appreciated during bimanual exam.   ASSESSMENT/PLAN:   Screen for STD (sexually transmitted disease) GC/Chlamydia/trich, yeast and BV obtained today RPR, HIV, Hepatitis C serology also obtained Discussed contraception options as she is not trying to conceive Will f/u on results and let patient know if treatment warranted     Orvis Brill, [place]

## 2022-06-05 NOTE — Assessment & Plan Note (Signed)
GC/Chlamydia/trich, yeast and BV obtained today RPR, HIV, Hepatitis C serology also obtained Discussed contraception options as she is not trying to conceive Will f/u on results and let patient know if treatment warranted

## 2022-06-06 LAB — HCV AB W REFLEX TO QUANT PCR: HCV Ab: NONREACTIVE

## 2022-06-06 LAB — HCV INTERPRETATION

## 2022-06-06 LAB — CERVICOVAGINAL ANCILLARY ONLY
Chlamydia: NEGATIVE
Comment: NEGATIVE
Comment: NORMAL
Neisseria Gonorrhea: NEGATIVE

## 2022-06-06 LAB — HIV ANTIBODY (ROUTINE TESTING W REFLEX): HIV Screen 4th Generation wRfx: NONREACTIVE

## 2022-06-06 LAB — RPR: RPR Ser Ql: NONREACTIVE

## 2022-06-09 ENCOUNTER — Ambulatory Visit: Payer: Medicaid Other | Admitting: Family Medicine

## 2022-07-18 NOTE — Progress Notes (Unsigned)
  SUBJECTIVE:   CHIEF COMPLAINT / HPI:   STI check - recently became sexually active with a new partner, wants to be checked for STIs. Previously tested for ***.  - preferred gender of partner: *** - Medications tried: *** - Sexually active with *** *** partner(s) - Last sexual encounter: *** - Contraception: *** Symptoms include: {STISXs:28021}  Last pap  2021 NILM, next due 2024  PERTINENT  PMH / PSH:   Past Medical History:  Diagnosis Date   Labial tear freshman year   Overweight and obesity(278.0)    Sickle cell trait ([place]) 06/07/2017    OBJECTIVE:  There were no vitals taken for this visit.  General: NAD, pleasant, able to participate in exam Cardiac: RRR, no murmurs auscultated Respiratory: CTAB, normal WOB Abdomen: soft, non-tender, non-distended, normoactive bowel sounds Extremities: warm and well perfused, no edema or cyanosis Skin: warm and dry, no rashes noted Neuro: alert, no obvious focal deficits, speech normal Psych: Normal affect and mood  ASSESSMENT/PLAN:  No problem-specific Assessment & Plan notes found for this encounter.   No orders of the defined types were placed in this encounter.  No orders of the defined types were placed in this encounter.  No follow-ups on file. Erskine Emery, MD 07/18/2022, 10:31 AM PGY-2, [place] {    This will disappear when note is signed, click to select method of visit    :1}

## 2022-07-18 NOTE — Patient Instructions (Incomplete)
It was great to see you today! Thank you for choosing [facility] for your primary care. Christy Barnes was seen for STI check.  Today we addressed: I will call you with the results of your labs. Please practice safe sex and let us know if you need anything.   Local Resources:  [facility] [phone] [address] [place], [place] [address] [email or web address]  [facility] [phone] [address], [place], [place] [address]  [facility] [phone] [address] [place], [place] [address]  [place]: [address] [place], [place] [address] [phone]  [facility] [address] [place], [place] [address]  [facility] [address],  [address] [phone] Toll Free: [phone] [email or web address]   If you haven't already, sign up for My Chart to have easy access to your labs results, and communication with your primary care physician.  We are checking some labs today. If they are abnormal, I will call you. If they are normal, I will send you a MyChart message (if it is active) or a letter in the mail. If you do not hear about your labs in the next 2 weeks, please call the office. I recommend that you always bring your medications to each appointment as this makes it easy to ensure you are on the correct medications and helps [place] not miss refills when you need them. Call the clinic at [phone] if your symptoms worsen or you have any concerns.  You should return to our clinic No follow-ups on file. Please arrive 15 minutes before your appointment to ensure smooth check in process.  We appreciate your efforts in making this happen.  Thank you for allowing me to participate in your care, Erskine Emery, MD 07/18/2022, 10:32 AM PGY-2, [place]

## 2022-07-19 ENCOUNTER — Other Ambulatory Visit (HOSPITAL_COMMUNITY)
Admission: RE | Admit: 2022-07-19 | Discharge: 2022-07-19 | Disposition: A | Discharge status: Home / Self Care | Payer: Medicaid Other | Source: Ambulatory Visit | Attending: Family Medicine | Admitting: Family Medicine

## 2022-07-19 ENCOUNTER — Ambulatory Visit (INDEPENDENT_AMBULATORY_CARE_PROVIDER_SITE_OTHER): Payer: Self-pay | Admitting: Student

## 2022-07-19 ENCOUNTER — Other Ambulatory Visit: Payer: Self-pay | Admitting: Student

## 2022-07-19 VITALS — BP 116/83 | HR 82 | Wt 217.4 lb

## 2022-07-19 DIAGNOSIS — Z113 Encounter for screening for infections with a predominantly sexual mode of transmission: Secondary | ICD-10-CM | POA: Diagnosis not present

## 2022-07-19 DIAGNOSIS — F419 Anxiety disorder, unspecified: Secondary | ICD-10-CM

## 2022-07-19 DIAGNOSIS — B9689 Other specified bacterial agents as the cause of diseases classified elsewhere: Secondary | ICD-10-CM

## 2022-07-19 DIAGNOSIS — F32A Depression, unspecified: Secondary | ICD-10-CM

## 2022-07-19 LAB — POCT WET PREP (WET MOUNT)
Clue Cells Wet Prep Whiff POC: POSITIVE
Trichomonas Wet Prep HPF POC: ABSENT
WBC, Wet Prep HPF POC: NONE SEEN

## 2022-07-19 MED ORDER — METRONIDAZOLE 500 MG PO TABS: 500.0000 mg | ORAL_TABLET | Freq: Two times a day (BID) | ORAL | 0 refills | Status: AC

## 2022-07-19 NOTE — Assessment & Plan Note (Signed)
Changes in her life currently contributing to her depressed mood. Therapists based on insurance offered. No SI/HI. Return if she would like to discuss medications.

## 2022-07-19 NOTE — Assessment & Plan Note (Signed)
Reviewed labs and allergies,, will check HIV/RPR, GC/chlamydia vaginal/rectal/throat, wet prep and will call patient with results. Discussed safe sex practices. Will schedule pap if indicated, not indicated until 2024. Patient's questions answered to their satisfaction.

## 2022-07-19 NOTE — Assessment & Plan Note (Signed)
>>  ASSESSMENT AND PLAN FOR SCREEN FOR STD (SEXUALLY TRANSMITTED DISEASE) WRITTEN ON 07/19/2022 10:12 AM BY MAXWELL, ALLEE, MD  Reviewed labs and allergies,, will check HIV/RPR, GC/chlamydia vaginal/rectal/throat, wet prep and will call patient with results. Discussed safe sex practices. Will schedule pap if indicated, not indicated until 2024. Patient's questions answered to their satisfaction.

## 2022-07-20 LAB — RPR: RPR Ser Ql: NONREACTIVE

## 2022-07-20 LAB — HIV ANTIBODY (ROUTINE TESTING W REFLEX): HIV Screen 4th Generation wRfx: NONREACTIVE

## 2022-07-21 LAB — CERVICOVAGINAL ANCILLARY ONLY
Chlamydia: NEGATIVE
Chlamydia: NEGATIVE
Chlamydia: NEGATIVE
Comment: NEGATIVE
Comment: NEGATIVE
Comment: NEGATIVE
Comment: NORMAL
Comment: NORMAL
Comment: NORMAL
Neisseria Gonorrhea: NEGATIVE
Neisseria Gonorrhea: NEGATIVE
Neisseria Gonorrhea: NEGATIVE

## 2022-07-25 ENCOUNTER — Encounter: Payer: Self-pay | Admitting: Student

## 2022-07-25 DIAGNOSIS — J309 Allergic rhinitis, unspecified: Secondary | ICD-10-CM

## 2022-07-25 MED ORDER — FLUTICASONE PROPIONATE 50 MCG/ACT NA SUSP: 2.0000 | Freq: Every day | NASAL | 1 refills | Status: DC

## 2022-07-26 ENCOUNTER — Encounter: Payer: Self-pay | Admitting: Student

## 2022-09-27 ENCOUNTER — Encounter: Payer: Self-pay | Admitting: Student

## 2022-09-27 ENCOUNTER — Other Ambulatory Visit (HOSPITAL_COMMUNITY)
Admission: RE | Admit: 2022-09-27 | Discharge: 2022-09-27 | Disposition: A | Discharge status: Home / Self Care | Payer: Medicaid Other | Source: Ambulatory Visit | Attending: Family Medicine | Admitting: Family Medicine

## 2022-09-27 ENCOUNTER — Ambulatory Visit (INDEPENDENT_AMBULATORY_CARE_PROVIDER_SITE_OTHER): Payer: Self-pay | Admitting: Student

## 2022-09-27 VITALS — BP 108/72 | HR 68 | Wt 221.0 lb

## 2022-09-27 DIAGNOSIS — Z113 Encounter for screening for infections with a predominantly sexual mode of transmission: Secondary | ICD-10-CM | POA: Diagnosis not present

## 2022-09-27 DIAGNOSIS — Z23 Encounter for immunization: Secondary | ICD-10-CM

## 2022-09-27 LAB — POCT WET PREP (WET MOUNT)
Clue Cells Wet Prep Whiff POC: NEGATIVE
Trichomonas Wet Prep HPF POC: ABSENT
WBC, Wet Prep HPF POC: NONE SEEN

## 2022-09-27 NOTE — Progress Notes (Signed)
    SUBJECTIVE:   CHIEF COMPLAINT / HPI:   STI check - recently became sexually active with a new partner a few months ago, wants to be checked for STIs.  - preferred gender of partner: male - Sexually active with 5 partner(s) over last 6 months  - Contraception: None  Asymptomatic. Denies any vaginal irritation, change in discharge or odor.   Last Pap Smear 2021 NILM. Next due 2024  Desires flu vaccine today.  Regarding anxious/depressed mood at last visit, she says that this is improving.  Mostly circumstantial surrounding her current job at [employer].  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  With better insurance, she plans on starting therapy.  Not currently in therapy. She is currently working on getting a better job. Just got her masters in social work.  PERTINENT  PMH / PSH: Reviewed  OBJECTIVE:   BP 108/72   Pulse 68   Wt 221 lb (100.2 kg)   LMP 09/13/2022   SpO2 98%   BMI 37.93 kg/m   General: Well-appearing, no distress Respiratory: Normal WOB on room air GU:  External vulva and vagina nonerythematous, without any obvious lesions or rash.  Normal ruggae of vaginal walls.  Cervix is non erythematous and non-friable. Moderate amount of white discharge.  There is no cervical motion tenderness, masses or gross abnormalities appreciated during bimanual exam.     ASSESSMENT/PLAN:   Screen for STD (sexually transmitted disease) Exam with moderate amount white discharge. Collected wet prep, GC/chlamydia, HIV and RPR Discussed safe sex Discussed using contraception if not desiring pregnancy, declined today.    PHQ-9 reviewed. Score of 12, #9 score 0.  Orvis Brill, [place]

## 2022-09-27 NOTE — Assessment & Plan Note (Signed)
>>  ASSESSMENT AND PLAN FOR SCREEN FOR STD (SEXUALLY TRANSMITTED DISEASE) WRITTEN ON 09/27/2022 11:27 AM BY DAMERON, MARISA, DO  Exam with moderate amount white discharge. Collected wet prep, GC/chlamydia, HIV and RPR Discussed safe sex Discussed using contraception if not desiring pregnancy, declined today.

## 2022-09-27 NOTE — Assessment & Plan Note (Signed)
Exam with moderate amount white discharge. Collected wet prep, GC/chlamydia, HIV and RPR Discussed safe sex Discussed using contraception if not desiring pregnancy, declined today.

## 2022-09-27 NOTE — Patient Instructions (Addendum)
Great seeing you today.  We collected some testing and labs today. If anything is abnormal, I will call you. If your results are normal, I will send you a MyChart message.  Have a great day, Dr. Owens Shark

## 2022-09-28 LAB — RPR: RPR Ser Ql: NONREACTIVE

## 2022-09-28 LAB — HIV ANTIBODY (ROUTINE TESTING W REFLEX): HIV Screen 4th Generation wRfx: NONREACTIVE

## 2022-09-29 LAB — CERVICOVAGINAL ANCILLARY ONLY
Chlamydia: NEGATIVE
Comment: NEGATIVE
Comment: NORMAL
Neisseria Gonorrhea: NEGATIVE

## 2022-10-10 ENCOUNTER — Other Ambulatory Visit (HOSPITAL_COMMUNITY)
Admission: RE | Admit: 2022-10-10 | Discharge: 2022-10-10 | Disposition: A | Discharge status: Home / Self Care | Payer: Medicaid Other | Source: Ambulatory Visit | Attending: Family Medicine | Admitting: Family Medicine

## 2022-10-10 ENCOUNTER — Ambulatory Visit (INDEPENDENT_AMBULATORY_CARE_PROVIDER_SITE_OTHER): Payer: Self-pay | Admitting: Family Medicine

## 2022-10-10 VITALS — BP 117/71 | HR 90 | Wt 226.0 lb

## 2022-10-10 DIAGNOSIS — N898 Other specified noninflammatory disorders of vagina: Secondary | ICD-10-CM | POA: Insufficient documentation

## 2022-10-10 DIAGNOSIS — Z113 Encounter for screening for infections with a predominantly sexual mode of transmission: Secondary | ICD-10-CM | POA: Diagnosis not present

## 2022-10-10 LAB — POCT WET PREP (WET MOUNT)
Clue Cells Wet Prep Whiff POC: NEGATIVE
Trichomonas Wet Prep HPF POC: ABSENT

## 2022-10-10 NOTE — Addendum Note (Signed)
Addended by: [place] Pink on: 10/10/2022 02:28 PM   Modules accepted: Orders

## 2022-10-10 NOTE — Progress Notes (Signed)
    SUBJECTIVE:   CHIEF COMPLAINT / HPI:   Vaginal Discharge: Patient is a 26 y.o. female presenting with vaginal irritation, itching, and bumps. She also endorses a vaginal odor.  Not currently on contraception, declines at this time. Occasionally uses condoms. She is interested in screening for sexually transmitted infections today as well as blood work.   PERTINENT  PMH / PSH: Reviewed   OBJECTIVE:   BP 117/71   Pulse 90   Wt 226 lb (102.5 kg)   LMP 09/13/2022   SpO2 100%   BMI 38.79 kg/m    General: NAD, pleasant, able to participate in exam Respiratory: Normal effort, no obvious respiratory distress Pelvic: VULVA: normal appearing vulva with no masses, tenderness or lesions, VAGINA: Normal appearing vagina with normal color,  with scant discharge present. 3 non tender flesh colored papules on left inner labia CERVIX: No lesions, scant discharge present  Chaperone Erskine Emery present for pelvic exam  ASSESSMENT/PLAN:   No problem-specific Assessment & Plan notes found for this encounter.   Assessment:  26 y.o. female with vaginal irritation, itching, and bumps. Physical exam significant 3 nontender flesh colored papules on left inner labia. Bumps likely due to folliculitis but will check for HSV. Patient is interested in STI screening and blood work.  Patient not interested in contraception at this time, advised her to let me know if she changes her mind.  Plan: -Wet prep -GC/chlamydia - HSV culture  -Will check HIV and [place]

## 2022-10-10 NOTE — Patient Instructions (Addendum)
It was great seeing you today!  Today we checked for STIs, BV, yeast. I will call you if anything is abnormal, or will send a mychart message if normal.  Give me a call if you decide you want me to send in contraception.   Feel free to call with any questions or concerns at any time, at [phone].   Take care,  Dr. Shary Key [facility]

## 2022-10-11 LAB — RPR: RPR Ser Ql: NONREACTIVE

## 2022-10-11 LAB — CERVICOVAGINAL ANCILLARY ONLY
Chlamydia: NEGATIVE
Comment: NEGATIVE
Comment: NORMAL
Neisseria Gonorrhea: NEGATIVE

## 2022-10-11 LAB — HIV ANTIBODY (ROUTINE TESTING W REFLEX): HIV Screen 4th Generation wRfx: NONREACTIVE

## 2022-10-12 LAB — HERPES SIMPLEX VIRUS CULTURE

## 2022-11-06 ENCOUNTER — Ambulatory Visit (INDEPENDENT_AMBULATORY_CARE_PROVIDER_SITE_OTHER): Payer: Self-pay | Admitting: Family Medicine

## 2022-11-06 ENCOUNTER — Other Ambulatory Visit: Payer: Self-pay

## 2022-11-06 ENCOUNTER — Encounter: Payer: Self-pay | Admitting: Family Medicine

## 2022-11-06 VITALS — BP 122/80 | HR 70 | Temp 98.8°F | Wt 230.0 lb

## 2022-11-06 DIAGNOSIS — J069 Acute upper respiratory infection, unspecified: Secondary | ICD-10-CM

## 2022-11-06 DIAGNOSIS — R051 Acute cough: Secondary | ICD-10-CM

## 2022-11-06 DIAGNOSIS — Z3202 Encounter for pregnancy test, result negative: Secondary | ICD-10-CM

## 2022-11-06 DIAGNOSIS — R632 Polyphagia: Secondary | ICD-10-CM

## 2022-11-06 LAB — POCT URINE PREGNANCY: Preg Test, Ur: NEGATIVE

## 2022-11-06 NOTE — Patient Instructions (Signed)
It was great seeing you today!  I am sorry you aren't feeling well! This is likely a viral infection and we did check for COVID/Flu/RSV. I will call you if anything is abnormal or will send a mychart message if normal. It is important to drink plenty of fluids. Honey can help with the sore throat and cough. Steam showers and humidifier can help with congestion. You can use over the counter Mucinex of Robitussin DM to also help with cough and congestion.  We also checked for pregnancy today.   Feel free to call with any questions or concerns at any time, at [phone].   Take care,  Dr. Shary Key [facility]

## 2022-11-06 NOTE — Progress Notes (Signed)
    SUBJECTIVE:   CHIEF COMPLAINT / HPI:   Patient endorses sore throat since Saturday. Went to [employer] from Friday-Sunday. Woke up today and it was worse. Woke up with cough today. Body aches. Able to swallow ok. Has felt hold and cold, no documented fevers. Had an episode of non bloody emesis. Coughs up some green mucous. Denies diarrhea or constipation.   Has been gaining weight, does note increased appetite. Wants to check for pregnancy  LMP 11/23. Does not desire pregnancy, asked about contraception and she states she is going to practice abstinence   Requests a note for work  PERTINENT  PMH / PSH: [place]   OBJECTIVE:   BP 122/80   Pulse 70   Temp 98.8 F (37.1 C) (Oral)   Wt 230 lb (104.3 kg)   SpO2 100%   BMI 39.48 kg/m    Physical exam General: well appearing, NAD HEENT: MMM. Oropharynx non erythematous and without lesions  Cardiovascular: RRR, no murmurs Lungs: CTAB. Normal WOB Abdomen: soft, non-distended, non-tender Skin: warm, dry. No edema  ASSESSMENT/PLAN:   No problem-specific Assessment & Plan notes found for this encounter.   Cough and congestion 2 days in duration.likely from a viral infection. Using shared decision making patient opted for Flu/COVID/RSV swab.  Discussed symptomatic management including over-the-counter cough medication, staying well-hydrated, honey for throat and cough, steam baths and humidifier for congestion.  Provided note for work, return precautions discussed.  Encounter for pregnancy test  Patient notes increased appetite and thinks it may be related to being pregnant.  Has not taken a pregnancy test.  LMP on 11/23.  Discussed waiting as she has not missed her period, but patient would still like a pregnancy test.  U pregnant negative.  Ordered beta hCG  Cora Collum, DO [facility]

## 2022-11-07 DIAGNOSIS — Z3200 Encounter for pregnancy test, result unknown: Secondary | ICD-10-CM | POA: Insufficient documentation

## 2022-11-07 LAB — BETA HCG QUANT (REF LAB): hCG Quant: <1 m[IU]/mL

## 2022-11-07 NOTE — Assessment & Plan Note (Signed)
2 days in duration.likely from a viral infection.  Discussed symptomatic management including over-the-counter cough medication, staying well-hydrated, honey for throat and cough, steam baths and humidifier for congestion.  Provided note for work, return precautions discussed.

## 2022-11-07 NOTE — Assessment & Plan Note (Signed)
Patient notes increased appetite and thinks it may be related to being pregnant.  Has not taken a pregnancy test.  LMP on 11/23.  Discussed waiting as she has not missed her period, but patient would still like a pregnancy test.  U pregnant negative.  Ordered beta hCG

## 2022-11-08 ENCOUNTER — Telehealth: Payer: Self-pay

## 2022-11-08 LAB — COVID-19, FLU A+B AND RSV
Influenza A, NAA: NOT DETECTED
Influenza B, NAA: NOT DETECTED
RSV, NAA: DETECTED — AB
SARS-CoV-2, NAA: NOT DETECTED

## 2022-11-08 NOTE — Telephone Encounter (Signed)
Patient LVM on nurse line in regards to positive RSV.   Patient is requesting a medications to the pharmacy to help her symptoms.   Will forward to provider who saw patient.

## 2022-11-09 NOTE — Telephone Encounter (Signed)
Patient contacted and conservative measures given.   Patient reports she is improving, however still feels she is unable to work. She reports fatigue, congestion and continued cough.   She is hoping to go back to work on Saturday 12/23.  Current work note needs to be updated to reflect this.   Will forward to provider who saw patient.

## 2022-11-10 NOTE — Telephone Encounter (Signed)
Letter written and patient aware.   Sent via [employer].

## 2022-11-10 NOTE — Telephone Encounter (Signed)
Patient calls nurse line regarding letter for work.  Reports continued cough, congestion, weakness, and chills.    Patient reports that she does not feel that she will be able to return to work tomorrow and is asking that dates be extended to allow return to work on 12/26. Patient works at [employer] and is concerned about performing job and getting others sick.   She is also asking that letter state that she has RSV and that this is a contagious illness, as her employer "does not know what this is."  Will forward to Dr. Arby Barrette for advisement.   Talbot Grumbling, RN

## 2022-11-10 NOTE — Telephone Encounter (Signed)
Spoke with provider regarding work note. Received verbal permission to provide with letter including diagnosis and that patient should remain out of work until symptoms improved.   Called patient and provided with update. Letter drafted and sent to patient. Patient appreciative.   Talbot Grumbling, RN

## 2022-12-07 ENCOUNTER — Other Ambulatory Visit (HOSPITAL_COMMUNITY)
Admission: RE | Admit: 2022-12-07 | Discharge: 2022-12-07 | Disposition: A | Discharge status: Home / Self Care | Payer: Medicaid Other | Source: Ambulatory Visit | Attending: Family Medicine | Admitting: Family Medicine

## 2022-12-07 ENCOUNTER — Encounter: Payer: Self-pay | Admitting: Family Medicine

## 2022-12-07 ENCOUNTER — Ambulatory Visit (INDEPENDENT_AMBULATORY_CARE_PROVIDER_SITE_OTHER): Payer: Self-pay | Admitting: Family Medicine

## 2022-12-07 VITALS — BP 120/68 | HR 74 | Ht 64.0 in | Wt 229.0 lb

## 2022-12-07 DIAGNOSIS — Z113 Encounter for screening for infections with a predominantly sexual mode of transmission: Secondary | ICD-10-CM | POA: Insufficient documentation

## 2022-12-07 DIAGNOSIS — B9689 Other specified bacterial agents as the cause of diseases classified elsewhere: Secondary | ICD-10-CM

## 2022-12-07 DIAGNOSIS — N760 Acute vaginitis: Secondary | ICD-10-CM

## 2022-12-07 LAB — POCT WET PREP (WET MOUNT)
Clue Cells Wet Prep Whiff POC: POSITIVE
Trichomonas Wet Prep HPF POC: ABSENT

## 2022-12-07 MED ORDER — METRONIDAZOLE 500 MG PO TABS: 500.0000 mg | ORAL_TABLET | Freq: Two times a day (BID) | ORAL | 0 refills | Status: AC

## 2022-12-07 MED ORDER — FOLIC ACID 1 MG PO TABS: 1.0000 mg | ORAL_TABLET | Freq: Every day | ORAL | 3 refills | Status: AC

## 2022-12-07 NOTE — Assessment & Plan Note (Addendum)
Will do routine testing for STI's. Vaginal and oral swabs performed today. Encouraged routine barrier protection. Encouraged prenatal vitamins and refilled folic acid today. Briefly discussed other contraceptions options as well and she will return if she would like to proceed.  Plan: -Wet prep returned +BV, rx for flagyl sent  -GC/chlamydia pending (oral and vaginal swabs) -Will check HIV, Hep B and RPR -Contraception: condoms intermittently. Discussed importance of condoms in protecting against STI. Offered condoms and she declined -Pap smear UTD, next due 04/2023

## 2022-12-07 NOTE — Progress Notes (Signed)
    SUBJECTIVE:   CHIEF COMPLAINT / HPI:   Vaginal Discharge: Patient is a 27 y.o. female presenting with vaginal discomfort, irritation, and odor x1 week. She denies any abnormal vaginal discharge. She is sexually active with two female partners and intermittently uses barrier protection.  She would like all routine STI's checked today.  She is on day 3 of her menstrual period.   Last Pap smear 04/2020, NILM.   PERTINENT  PMH / PSH: None relevant  OBJECTIVE:   BP 120/68   Pulse 74   Ht '5\' 4"'$  (1.626 m)   Wt 229 lb (103.9 kg)   LMP 12/05/2022   SpO2 97%   BMI 39.31 kg/m    General: NAD, pleasant, able to participate in exam Respiratory: Normal effort, no obvious respiratory distress Pelvic: VULVA: normal appearing vulva with no masses, tenderness or lesions, VAGINA: Normal appearing vagina with normal color, no lesions, with bloody and thin discharge present CERVIX: No lesions, bloody and thin discharge present,  Chaperone Erskine Emery, CMA present for pelvic exam  ASSESSMENT/PLAN:   Routine screening for STI (sexually transmitted infection) Will do routine testing for STI's. Vaginal and oral swabs performed today. Encouraged routine barrier protection. Encouraged prenatal vitamins and refilled folic acid today. Briefly discussed other contraceptions options as well and she will return if she would like to proceed.  Plan: -Wet prep returned +BV, rx for flagyl sent  -GC/chlamydia pending (oral and vaginal swabs) -Will check HIV, Hep B and RPR -Contraception: condoms intermittently. Discussed importance of condoms in protecting against STI. Offered condoms and she declined -Pap smear UTD, next due 04/2023    Sharion Settler, [place]

## 2022-12-07 NOTE — Patient Instructions (Signed)
It was wonderful to see you today.  Please bring ALL of your medications with you to every visit.   Today we talked about:  -We are checking for sexually transmitted infections including chlamydia, gonorrhea, trichomonas, HIV, syphilis and hepatitis B. I will let you know of the results via MyChart or telephone call. We are also checking for bacterial vaginosis and yeast, which are not sexually transmitted infections.  -You should abstain from sexual activity until we have the results. If your test is positive for a sexually transmitted infection, it is important that both you and your partner are both treated.  -It is always important to use barrier protection, such as condoms, to help prevent sexually transmitted infections.    Thank you for coming to your visit as scheduled. We have had a large "no-show" problem lately, and this significantly limits our ability to see and care for patients. As a friendly reminder- if you cannot make your appointment please call to cancel. We do have a no show policy for those who do not cancel within 24 hours. Our policy is that if you miss or fail to cancel an appointment within 24 hours, 3 times in a 22-monthperiod, you may be dismissed from our clinic.   Thank you for choosing [facility]   Please call [phone] any questions about today's appointment.  Please be sure to schedule follow up at the front  desk before you leave today.   ASharion Settler DO PGY-3 Family Medicine

## 2022-12-08 LAB — SYPHILIS: RPR W/REFLEX TO RPR TITER AND TREPONEMAL ANTIBODIES, TRADITIONAL SCREENING AND DIAGNOSIS ALGORITHM: RPR Ser Ql: NONREACTIVE

## 2022-12-08 LAB — CERVICOVAGINAL ANCILLARY ONLY
Chlamydia: NEGATIVE
Chlamydia: NEGATIVE
Comment: NEGATIVE
Comment: NORMAL
Comment: NEGATIVE
Comment: NEGATIVE
Comment: NORMAL
Neisseria Gonorrhea: NEGATIVE
Neisseria Gonorrhea: NEGATIVE
Trichomonas: NEGATIVE

## 2022-12-08 LAB — HIV ANTIBODY (ROUTINE TESTING W REFLEX): HIV Screen 4th Generation wRfx: NONREACTIVE

## 2022-12-08 LAB — HEPATITIS B SURFACE ANTIGEN: Hepatitis B Surface Ag: NEGATIVE

## 2023-01-08 ENCOUNTER — Other Ambulatory Visit (HOSPITAL_COMMUNITY)
Admission: RE | Admit: 2023-01-08 | Discharge: 2023-01-08 | Disposition: A | Discharge status: Home / Self Care | Payer: Medicaid Other | Source: Ambulatory Visit | Attending: Family Medicine | Admitting: Family Medicine

## 2023-01-08 ENCOUNTER — Ambulatory Visit (INDEPENDENT_AMBULATORY_CARE_PROVIDER_SITE_OTHER): Payer: Self-pay | Admitting: Family Medicine

## 2023-01-08 VITALS — BP 119/80 | HR 87 | Ht 64.0 in | Wt 226.5 lb

## 2023-01-08 DIAGNOSIS — Z113 Encounter for screening for infections with a predominantly sexual mode of transmission: Secondary | ICD-10-CM | POA: Insufficient documentation

## 2023-01-08 NOTE — Patient Instructions (Addendum)
It was great seeing you today!  Today we discussed your vaginal symptoms. We did testing, I will let you know of the results and we will treat appropriately. Please make sure to use protection each time. Your next PAP is not due until June 2024.  Please follow up at your next scheduled appointment, if anything arises between now and then, please don't hesitate to contact our office.   Thank you for allowing [place] to be a part of your medical care!  Thank you, Dr. Larae Grooms  Also a reminder of our clinic's no-show policy. Please make sure to arrive at least 15 minutes prior to your scheduled appointment time. Please try to cancel before 24 hours if you are not able to make it. If you no-show for 2 appointments then you will be receiving a warning letter. If you no-show after 3 visits, then you may be at risk of being dismissed from our clinic. This is to ensure that everyone is able to be seen in a timely manner. Thank you, we appreciate your assistance with this!

## 2023-01-08 NOTE — Assessment & Plan Note (Signed)
-  GC/Chlamydia and wet prep pending -RPR and HIV pending  -safe sex counseling discussed -PAP up to date, due June 2024  -follow up as appropriate

## 2023-01-08 NOTE — Progress Notes (Signed)
    SUBJECTIVE:   CHIEF COMPLAINT / HPI:   Patient presents with vaginal discharge, odor and irritation for the past 1-2 weeks. Vaginal discharge appears to be yellow and thick. History of STI in the past, she believes it was gonorrhea and chlamydia. No new sexual partners. More than 1 partner intermittently but says recently just one. Partner does not have any symptoms that she is aware of. Denies fever, chills, dysuria, pelvic and abdominal pain. Does not use protection regularly and is not on birth control but LMP 01/03/2023 with typical periods.   OBJECTIVE:   BP 119/80   Pulse 87   Ht 5' 4"$  (1.626 m)   Wt 226 lb 8 oz (102.7 kg)   LMP 01/03/2023   SpO2 100%   BMI 38.88 kg/m   General: Patient well-appearing, in no acute distress. Resp: normal work of breathing noted, no evidence of respiratory distress  GU: normal labia, no vaginal or cervical discharge noted, no bleeding or associated odor, no rashes or external lesions noted  GU exam performed in the presence of chaperone, [employer], CMA.   ASSESSMENT/PLAN:   Routine screening for STI (sexually transmitted infection) -GC/Chlamydia and wet prep pending -RPR and HIV pending  -safe sex counseling discussed -PAP up to date, due June 2024  -follow up as appropriate    -PHQ-9 score of 0 reviewed.    Donney Dice, [place]

## 2023-01-09 ENCOUNTER — Other Ambulatory Visit: Payer: Self-pay | Admitting: Family Medicine

## 2023-01-09 DIAGNOSIS — B9689 Other specified bacterial agents as the cause of diseases classified elsewhere: Secondary | ICD-10-CM

## 2023-01-09 LAB — CERVICOVAGINAL ANCILLARY ONLY
Bacterial Vaginitis (gardnerella): POSITIVE — AB
Candida Glabrata: NEGATIVE
Candida Vaginitis: NEGATIVE
Chlamydia: NEGATIVE
Comment: NEGATIVE
Comment: NEGATIVE
Comment: NEGATIVE
Comment: NEGATIVE
Comment: NEGATIVE
Comment: NORMAL
Neisseria Gonorrhea: NEGATIVE
Trichomonas: NEGATIVE

## 2023-01-09 LAB — RPR W/REFLEX TO TREPSURE

## 2023-01-09 MED ORDER — METRONIDAZOLE 500 MG PO TABS: 500.0000 mg | ORAL_TABLET | Freq: Two times a day (BID) | ORAL | 0 refills | Status: AC

## 2023-01-10 LAB — T PALLIDUM ANTIBODY, EIA: T pallidum Antibody, EIA: NEGATIVE

## 2023-01-10 LAB — HIV ANTIBODY (ROUTINE TESTING W REFLEX): HIV Screen 4th Generation wRfx: NONREACTIVE

## 2023-01-10 LAB — RPR W/REFLEX TO TREPSURE: RPR: NONREACTIVE

## 2023-01-22 ENCOUNTER — Ambulatory Visit (INDEPENDENT_AMBULATORY_CARE_PROVIDER_SITE_OTHER): Payer: Self-pay | Admitting: Family Medicine

## 2023-01-22 VITALS — BP 116/71 | HR 84 | Ht 64.0 in | Wt 230.2 lb

## 2023-01-22 DIAGNOSIS — N898 Other specified noninflammatory disorders of vagina: Secondary | ICD-10-CM

## 2023-01-22 MED ORDER — NYSTATIN 100000 UNIT/GM EX CREA: 1.0000 | TOPICAL_CREAM | Freq: Two times a day (BID) | CUTANEOUS | 1 refills | Status: AC

## 2023-01-22 NOTE — Progress Notes (Signed)
    SUBJECTIVE:   CHIEF COMPLAINT / HPI:   Vaginal Discomfort - Currently being treated for BV with metronidazole - Is having sensation of irritation/itching in the labia  PERTINENT  PMH / PSH: Reviewed  OBJECTIVE:   Wt 230 lb 4 oz (104.4 kg)   LMP 01/03/2023   BMI 39.52 kg/m   General: NAD, well-appearing, well-nourished Respiratory: No respiratory distress, breathing comfortably, able to speak in full sentences Skin: warm and dry, no rashes noted on exposed skin Psych: Appropriate affect and mood Pelvic exam: VULVA: normal appearing vulva with no masses, mild hyperpigmented regions in the folds. VAGINA: normal appearing vagina with normal color and discharge, no lesions, exam chaperoned by Christy Barnes, CMA.  ASSESSMENT/PLAN:   Vaginal itching/discomfort Patient being treated for metronidazole for BV infection.  Given recent STD testing do not think that that is warranted at this time.  Physical exam with some mild hyperpigmentation that could be contributed from a candidal infection in her groin folds.  Do not see any other lesions that would contribute to this sensation. - Trial of Nystatin cream - If not improving, patient to touch base in 3-5 days   Christy Barnes, [place]

## 2023-01-22 NOTE — Patient Instructions (Signed)
I am going to send in a cream to try and use 4 times daily for the next few weeks.  If you notice that there is no improvement by Friday, let our office know.  If you start to have any kind of clumpy white vaginal discharge also let us know as this can happen if you take the metronidazole antibiotic.

## 2023-01-29 ENCOUNTER — Ambulatory Visit: Payer: Medicaid Other | Admitting: Student

## 2023-02-21 ENCOUNTER — Ambulatory Visit (INDEPENDENT_AMBULATORY_CARE_PROVIDER_SITE_OTHER): Payer: Medicaid Other | Admitting: Student

## 2023-02-21 ENCOUNTER — Other Ambulatory Visit (HOSPITAL_COMMUNITY)
Admission: RE | Admit: 2023-02-21 | Discharge: 2023-02-21 | Disposition: A | Discharge status: Home / Self Care | Payer: Medicaid Other | Source: Ambulatory Visit | Attending: Family Medicine | Admitting: Family Medicine

## 2023-02-21 VITALS — BP 112/64 | HR 77 | Wt 227.0 lb

## 2023-02-21 DIAGNOSIS — Z113 Encounter for screening for infections with a predominantly sexual mode of transmission: Secondary | ICD-10-CM | POA: Diagnosis not present

## 2023-02-21 DIAGNOSIS — N898 Other specified noninflammatory disorders of vagina: Secondary | ICD-10-CM | POA: Diagnosis not present

## 2023-02-21 NOTE — Progress Notes (Signed)
  SUBJECTIVE:   CHIEF COMPLAINT / HPI:   STI check -Is planning to be sexually active with a new partner, wants to be checked for STIs. - Last sexual encounter: 2/12 -LMP 3/21 - Contraception: female condoms Symptoms include: Abnormal vaginal discharge, vaginal irritation which was unchanged after using the nystatin cream.  Vaginal irritation: She does endorse using a soap and oil with natural lemon grass near her vaginal area.  She is unaware of if it is fragranced.  PERTINENT  PMH / PSH: BV, gonorrhea/chlamydia  Patient Care Team: Gerrit Heck, MD as PCP - General (Family Medicine) Gaspar Skeeters, MD as Consulting Physician (Adolescent Medicine) OBJECTIVE:  BP 112/64   Pulse 77   Wt 227 lb (103 kg)   LMP 02/08/2023   SpO2 99%   BMI 38.96 kg/m  Pelvic exam: VULVA: normal appearing vulva with no masses, tenderness or lesions, VAGINA: vaginal discharge - white, odorless, and scant, CERVIX: normal appearing cervix without discharge or lesions.  Chaperone: Page Nicki Reaper  ASSESSMENT/PLAN:  Routine screening for STI (sexually transmitted infection) Assessment & Plan: Unremarkable physical exam.  No signs of HSV which patient did ask about.  Advised against testing for this given unremarkable physical exam.  Counseled on failure rate of female condoms.  Patient is not interested in further methods of contraception today.  Orders: -     RPR -     HIV Antibody (routine testing w rflx) -     Cervicovaginal ancillary only  Vaginal irritation Assessment & Plan: Advised against using soap/oil with fragrance in that area regardless of how long she has used in the past has new allergies or irritations may have developed.  No signs of skin breakdown or irritation during pelvic exam.   Return if symptoms worsen or fail to improve. Wells Guiles, DO 02/21/2023, 10:42 AM PGY-2, [place]

## 2023-02-21 NOTE — Assessment & Plan Note (Signed)
Advised against using soap/oil with fragrance in that area regardless of how long she has used in the past has new allergies or irritations may have developed.  No signs of skin breakdown or irritation during pelvic exam.

## 2023-02-21 NOTE — Patient Instructions (Addendum)
It was great to see you today! Thank you for choosing [facility] for your primary care. ENGLISH ACKER was seen for STI check.  Today we addressed: As for the vaginal irritation, I would recommend taking a look at the ingredients in the products to use and if there are any fragrances, consider stopping using them for a while as fragrances can be irritating to the skin.  If you haven't already, sign up for My Chart to have easy access to your labs results, and communication with your primary care physician.  We are checking some labs today. If they are abnormal, I will call you. If they are normal, I will send you a MyChart message (if it is active) or a letter in the mail. If you do not hear about your labs in the next 2 weeks, please call the office.  You should return to our clinic Return if symptoms worsen or fail to improve. Please arrive 15 minutes before your appointment to ensure smooth check in process.  We appreciate your efforts in making this happen.  Thank you for allowing me to participate in your care, Wells Guiles, DO 02/21/2023, 10:12 AM PGY-2, [place]

## 2023-02-21 NOTE — Assessment & Plan Note (Addendum)
Unremarkable physical exam.  No signs of HSV which patient did ask about.  Advised against testing for this given unremarkable physical exam.  Counseled on failure rate of female condoms.  Patient is not interested in further methods of contraception today.

## 2023-02-22 LAB — HIV ANTIBODY (ROUTINE TESTING W REFLEX): HIV Screen 4th Generation wRfx: NONREACTIVE

## 2023-02-22 LAB — RPR: RPR Ser Ql: NONREACTIVE

## 2023-02-23 LAB — CERVICOVAGINAL ANCILLARY ONLY
Bacterial Vaginitis (gardnerella): POSITIVE — AB
Candida Glabrata: NEGATIVE
Candida Vaginitis: POSITIVE — AB
Chlamydia: NEGATIVE
Comment: NEGATIVE
Comment: NEGATIVE
Comment: NEGATIVE
Comment: NEGATIVE
Comment: NEGATIVE
Comment: NORMAL
Neisseria Gonorrhea: NEGATIVE
Trichomonas: NEGATIVE

## 2023-02-24 ENCOUNTER — Other Ambulatory Visit: Payer: Self-pay | Admitting: Student

## 2023-02-24 MED ORDER — FLUCONAZOLE 150 MG PO TABS: 150.0000 mg | ORAL_TABLET | Freq: Once | ORAL | 0 refills | Status: AC

## 2023-02-24 MED ORDER — METRONIDAZOLE 500 MG PO TABS: 500.0000 mg | ORAL_TABLET | Freq: Two times a day (BID) | ORAL | 0 refills | Status: DC

## 2023-04-18 ENCOUNTER — Ambulatory Visit (INDEPENDENT_AMBULATORY_CARE_PROVIDER_SITE_OTHER): Payer: Self-pay | Admitting: Family Medicine

## 2023-04-18 ENCOUNTER — Encounter: Payer: Self-pay | Admitting: Family Medicine

## 2023-04-18 ENCOUNTER — Other Ambulatory Visit (HOSPITAL_COMMUNITY)
Admission: RE | Admit: 2023-04-18 | Discharge: 2023-04-18 | Disposition: A | Discharge status: Home / Self Care | Payer: Medicaid Other | Source: Ambulatory Visit | Attending: Family Medicine | Admitting: Family Medicine

## 2023-04-18 VITALS — BP 118/74 | HR 80 | Ht 64.0 in | Wt 228.0 lb

## 2023-04-18 DIAGNOSIS — Z113 Encounter for screening for infections with a predominantly sexual mode of transmission: Secondary | ICD-10-CM | POA: Insufficient documentation

## 2023-04-18 DIAGNOSIS — Z124 Encounter for screening for malignant neoplasm of cervix: Secondary | ICD-10-CM

## 2023-04-18 NOTE — Addendum Note (Signed)
Addended by: Maury Dus on: 04/18/2023 02:01 PM   Modules accepted: Orders

## 2023-04-18 NOTE — Patient Instructions (Addendum)
It was great to see you!  Today we did your Pap which is screening for cervical cancer. We also did routine testing for sexually transmitted infections.  I will send you a MyChart message with the results or call if they are abnormal.   Always use condoms to prevent sexually transmitted infections as well as unwanted pregnancy.  If you decide you are interested in discussing other forms of birth control please let [place] know right away.  I recommend taking a prenatal vitamin daily.  Take care and seek immediate care sooner if you develop any concerns.  Dr. Estil [facility]

## 2023-04-18 NOTE — Assessment & Plan Note (Signed)
Vaginal GC/chlamydia/trich testing obtained on Pap.  Oropharyngeal swab for GC/chlamydia/trich also done today as well as HIV and RPR.  Will follow-up results. We discussed limited utility of HSV testing in the absence of outbreak. Discussed PrEP, patient not interested at this time but will think about it and let us know. Advised condom use. Patient declines to discuss other birth control options, but will let [place] know if she is interested.  Advised daily prenatal vitamin in the meantime.

## 2023-04-18 NOTE — Progress Notes (Signed)
    SUBJECTIVE:   CHIEF COMPLAINT / HPI:   STI testing Sexually active with new partner about 1 week ago and would like to be tested for STIs. Female partners. Not having any symptoms. LMP 04/06/2023. Current method of contraception: Condoms.  Not interested in discussing other methods at this time Remote history of gonorrhea and chlamydia  Patient reports previous partner had HSV.  She has never had any lesions/outbreaks but is concerned about the possibility of being infected with HSV.  PERTINENT  PMH / PSH: Obesity, anxious and depressed mood  OBJECTIVE:   BP 118/74   Pulse 80   Ht 5\' 4"  (1.626 m)   Wt 228 lb (103.4 kg)   LMP 04/06/2023   SpO2 99%   BMI 39.14 kg/m   General: NAD, pleasant, able to participate in exam Respiratory: No respiratory distress Skin: warm and dry, no rashes noted Psych: Normal affect and mood Neuro: grossly intact GU/GYN: Exam performed in the presence of a chaperone. External genitalia within normal limits.  Vaginal mucosa pink, moist, normal rugae.  Nonfriable cervix without lesions, no discharge or bleeding noted on speculum exam.     ASSESSMENT/PLAN:   Routine screening for STI (sexually transmitted infection) Vaginal GC/chlamydia/trich testing obtained on Pap.  Oropharyngeal swab for GC/chlamydia/trich also done today as well as HIV and RPR.  Will follow-up results. We discussed limited utility of HSV testing in the absence of outbreak. Discussed PrEP, patient not interested at this time but will think about it and let us know. Advised condom use. Patient declines to discuss other birth control options, but will let [place] know if she is interested.  Advised daily prenatal vitamin in the meantime.   Screening for Cervical Cancer Due for pap which was obtained today.    Maury Dus, MD [facility]

## 2023-04-19 LAB — HIV ANTIBODY (ROUTINE TESTING W REFLEX): HIV Screen 4th Generation wRfx: NONREACTIVE

## 2023-04-19 LAB — RPR: RPR Ser Ql: NONREACTIVE

## 2023-04-20 LAB — CYTOLOGY - PAP
Adequacy: ABSENT
Chlamydia: NEGATIVE
Comment: NEGATIVE
Comment: NEGATIVE
Comment: NORMAL
Diagnosis: NEGATIVE
Neisseria Gonorrhea: NEGATIVE
Trichomonas: NEGATIVE

## 2023-04-20 LAB — CERVICOVAGINAL ANCILLARY ONLY
Chlamydia: NEGATIVE
Comment: NEGATIVE
Comment: NEGATIVE
Comment: NORMAL
Neisseria Gonorrhea: NEGATIVE
Trichomonas: NEGATIVE

## 2023-05-29 ENCOUNTER — Ambulatory Visit (INDEPENDENT_AMBULATORY_CARE_PROVIDER_SITE_OTHER): Payer: Self-pay | Admitting: Family Medicine

## 2023-05-29 ENCOUNTER — Other Ambulatory Visit (HOSPITAL_COMMUNITY): Admission: RE | Admit: 2023-05-29 | Payer: Medicaid Other | Source: Ambulatory Visit

## 2023-05-29 ENCOUNTER — Encounter: Payer: Self-pay | Admitting: Family Medicine

## 2023-05-29 VITALS — BP 112/72 | Ht 64.0 in | Wt 227.0 lb

## 2023-05-29 DIAGNOSIS — N898 Other specified noninflammatory disorders of vagina: Secondary | ICD-10-CM | POA: Insufficient documentation

## 2023-05-29 DIAGNOSIS — Z202 Contact with and (suspected) exposure to infections with a predominantly sexual mode of transmission: Secondary | ICD-10-CM

## 2023-05-29 DIAGNOSIS — J309 Allergic rhinitis, unspecified: Secondary | ICD-10-CM

## 2023-05-29 DIAGNOSIS — J029 Acute pharyngitis, unspecified: Secondary | ICD-10-CM

## 2023-05-29 LAB — POCT WET PREP (WET MOUNT)
Clue Cells Wet Prep Whiff POC: NEGATIVE
Trichomonas Wet Prep HPF POC: ABSENT

## 2023-05-29 MED ORDER — FLUTICASONE PROPIONATE 50 MCG/ACT NA SUSP
2.0000 | Freq: Every day | NASAL | 1 refills | Status: DC
Start: 2023-05-29 — End: 2023-07-20

## 2023-05-29 MED ORDER — FLUCONAZOLE 150 MG PO TABS: 150.0000 mg | ORAL_TABLET | Freq: Once | ORAL | 0 refills | Status: AC

## 2023-05-29 NOTE — Assessment & Plan Note (Addendum)
Some folliculitis noted on exam, also likely irritation from yeast infection seen on wet prep. Rx diflucan.

## 2023-05-29 NOTE — Patient Instructions (Signed)
It was great to see you!  Our plans for today:  - We are checking some labs today, we will release these results to your MyChart. - Make sure you wear a condom everytime you have sex for the full time.   Take care and seek immediate care sooner if you develop any concerns.   Dr. Linwood Dibbles

## 2023-05-29 NOTE — Progress Notes (Signed)
    SUBJECTIVE:   CHIEF COMPLAINT / HPI:   VAGINAL IRRITATION, SORE THROAT having vaginal irritation and sore throat for few days. Partner has h/o herpes and concerned about new infection though reports partner does not have current outbreak. States throat is sore but hasn't noticed any bumps or spots. Has oral sex. Has been out of flonase and noticing more nasal drainage and congestion. Noticing bumps along vulvar area, unsure if she has noticed any blisters or ulcers but doesn't think so. No abnormal vaginal discharge. Wears condoms. Denies fevers, dyspareunia, missed period (LMP 6/22).   OBJECTIVE:   BP 112/72 (BP Location: Right Arm, Patient Position: Sitting)   Ht 5\' 4"  (1.626 m)   Wt 227 lb (103 kg)   BMI 38.96 kg/m   Gen: well appearing, in NAD HEENT: orophyarynx clear without exudate or erythema. Uvula midline. No tonsillar enlargement. Good dentition.  GYN:  External genitalia within normal limits.  Minimal folliculitis present, no vesicles or pustules present. Vaginal mucosa pink, moist, normal rugae.  Nonfriable cervix without lesions, small amount of thick white discharge noted on speculum exam, no bleeding.   ASSESSMENT/PLAN:   Vaginal irritation Some folliculitis noted on exam, also likely irritation from yeast infection seen on wet prep. Rx diflucan.   Sore throat Normal exam. STI testing obtained given h/o oral sex with possible exposure. Likely 2/2 postnasal drip given recent increase in nasal congestion, drainage and being out of flonase, refilled.   Caro Laroche, DO

## 2023-05-30 LAB — CERVICOVAGINAL ANCILLARY ONLY
Chlamydia: NEGATIVE
Comment: NEGATIVE
Comment: NEGATIVE
Comment: NORMAL
Neisseria Gonorrhea: NEGATIVE
Trichomonas: NEGATIVE

## 2023-05-31 LAB — CERVICOVAGINAL ANCILLARY ONLY
Chlamydia: NEGATIVE
Comment: NEGATIVE
Comment: NEGATIVE
Comment: NORMAL
Neisseria Gonorrhea: NEGATIVE
Trichomonas: NEGATIVE

## 2023-06-21 DIAGNOSIS — Z419 Encounter for procedure for purposes other than remedying health state, unspecified: Secondary | ICD-10-CM | POA: Diagnosis not present

## 2023-07-16 ENCOUNTER — Encounter: Payer: Self-pay | Admitting: Student

## 2023-07-16 ENCOUNTER — Ambulatory Visit (INDEPENDENT_AMBULATORY_CARE_PROVIDER_SITE_OTHER): Payer: Medicaid Other | Admitting: Student

## 2023-07-16 VITALS — BP 109/74 | HR 63 | Ht 66.0 in | Wt 227.0 lb

## 2023-07-16 DIAGNOSIS — Z789 Other specified health status: Secondary | ICD-10-CM | POA: Diagnosis not present

## 2023-07-16 DIAGNOSIS — E559 Vitamin D deficiency, unspecified: Secondary | ICD-10-CM

## 2023-07-16 DIAGNOSIS — Z13220 Encounter for screening for lipoid disorders: Secondary | ICD-10-CM | POA: Diagnosis not present

## 2023-07-16 DIAGNOSIS — Z113 Encounter for screening for infections with a predominantly sexual mode of transmission: Secondary | ICD-10-CM

## 2023-07-16 DIAGNOSIS — R5383 Other fatigue: Secondary | ICD-10-CM

## 2023-07-16 LAB — POCT GLYCOSYLATED HEMOGLOBIN (HGB A1C): HbA1c, POC (controlled diabetic range): 4.9 % (ref 0.0–7.0)

## 2023-07-16 NOTE — Assessment & Plan Note (Signed)
Drinking around 4 bottles of wine a week when drinking.  She is in precontemplative stage, wanting to make a change in slightly cut down on her drinking.  I discussed that this is important in order to help protect her liver function.  She denies any history of withdrawal symptoms or DTs. -CMP

## 2023-07-16 NOTE — Progress Notes (Signed)
SUBJECTIVE:   Chief compliant/HPI: annual examination  Christy Barnes is a 27 y.o. who presents today for an annual exam.   Past few months-exhausted and drained. Headaches and pressure around eye. Noises and bright light has been having issues with focusing. Mood elvel 4-5. Feeling really low and tired  Not really heavy periods  Vaped nicotine and stopped - has been not smoking that for 55 days Marijuana occasionally Alcohol -she acknowledges that she is drinking more than she would like to be drinking.  She states that she drinks about a bottle of wine every other day. States she does that for one week and then another week will not drink. No withdrawal symptoms.  Updated history tabs and problem list.   OBJECTIVE:   BP 109/74   Pulse 63   Ht 5\' 6"  (1.676 m)   Wt 227 lb (103 kg)   LMP 07/10/2023   SpO2 100%   BMI 36.64 kg/m    General: Well appearing, NAD, awake, alert, responsive to questions Head: Normocephalic atraumatic CV: Regular rate and rhythm no murmurs rubs or gallops Respiratory: Clear to ausculation bilaterally, no wheezes rales or crackles, chest rises symmetrically,  no increased work of breathing Abdomen: Soft, non-tender, non-distended, normoactive bowel sounds  Extremities: Moves upper and lower extremities freely, no edema in LE Neuro: No focal deficits Skin: No rashes or lesions visualized   ASSESSMENT/PLAN:   Alcohol use Drinking around 4 bottles of wine a week when drinking.  She is in precontemplative stage, wanting to make a change in slightly cut down on her drinking.  I discussed that this is important in order to help protect her liver function.  She denies any history of withdrawal symptoms or DTs. -CMP  Fatigue Has been feeling very fatigued over the past few months and would like multiple vitamin labs checked today.  Discussed that we can start with baseline fatigue labs.  States that in the past she has been iron deficient.  Declines  any interest in starting medications for mood disorder.  We discussed cutting back on alcohol use. - Comprehensive metabolic panel - CBC - TSH Rfx on Abnormal to Free T4 - VITAMIN D 25 Hydroxy (Vit-D Deficiency, Fractures) - Ferritin - HgB A1c   Patient mentions that she would be interested in ADHD referral  Annual Examination  See AVS for age appropriate recommendations.   PHQ score  , reviewed and discussed.    07/16/2023   11:20 AM 04/18/2023    9:22 AM 02/21/2023    9:49 AM 01/08/2023   10:08 AM 12/07/2022    9:40 AM  Depression screen PHQ 2/9  Decreased Interest 0 1 0 0 1  Down, Depressed, Hopeless 1 2 0 0 2  PHQ - 2 Score 1 3 0 0 3  Altered sleeping 1 1 0 0 1  Tired, decreased energy 3 2 0 0 2  Change in appetite 1 1 0 0 1  Feeling bad or failure about yourself  1 0 0 0 0  Trouble concentrating 2 3 0 0 2  Moving slowly or fidgety/restless 1 1 0 0 2  Suicidal thoughts 0 0 0 0 0  PHQ-9 Score 10 11 0 0 11  Difficult doing work/chores Very difficult Very difficult   Very difficult   Blood pressure reviewed and at goal.  The patient currently uses nothing for contraception, declines today. Folate recommended as appropriate, minimum of 400 mcg per day.   Considered the following items  based upon USPSTF recommendations: HIV testing: ordered Hepatitis C: ordered Hepatitis B: ordered Syphilis if at high risk: ordered Lipid panel (nonfasting or fasting) discussed based upon AHA recommendations and ordered.  Consider repeat every 4-6 years.   Patient to message me about her family history of cancers Cervical cancer screening: prior Pap reviewed, repeat due in 2027  Follow up in 1  year or sooner if indicated.    Levin Erp, MD [facility]

## 2023-07-16 NOTE — Patient Instructions (Signed)
It was great to see you! Thank you for allowing me to participate in your care!   I recommend that you always bring your medications to each appointment as this makes it easy to ensure we are on the correct medications and helps [place] not miss when refills are needed.  Our plans for today:  Today at your annual preventive visit we talked about the following measures:   I recommend 150 minutes of exercise per week-try 30 minutes 5 days per week We discussed reducing sugary beverages (like soda and juice) and increasing leafy greens and whole fruits.  We discussed avoiding tobacco and alcohol.  I recommend avoiding illicit substances.   We are checking some labs today, I will call you if they are abnormal will send you a MyChart message or a letter if they are normal.  If you do not hear about your labs in the next 2 weeks please let us know.  Take care and seek immediate care sooner if you develop any concerns. Please remember to show up 15 minutes before your scheduled appointment time!  Levin Erp, MD [facility]

## 2023-07-16 NOTE — Assessment & Plan Note (Addendum)
Has been feeling very fatigued over the past few months and would like multiple vitamin labs checked today.  Discussed that we can start with baseline fatigue labs.  States that in the past she has been iron deficient.  Declines any interest in starting medications for mood disorder.  We discussed cutting back on alcohol use. - Comprehensive metabolic panel - CBC - TSH Rfx on Abnormal to Free T4 - VITAMIN D 25 Hydroxy (Vit-D Deficiency, Fractures) - Ferritin - HgB A1c

## 2023-07-17 LAB — COMPREHENSIVE METABOLIC PANEL
ALT: 21 IU/L (ref 0–32)
AST: 20 IU/L (ref 0–40)
Albumin: 4.2 g/dL (ref 4.0–5.0)
Alkaline Phosphatase: 55 IU/L (ref 44–121)
BUN/Creatinine Ratio: 8 — ABNORMAL LOW (ref 9–23)
BUN: 9 mg/dL (ref 6–20)
Bilirubin Total: 1.3 mg/dL — ABNORMAL HIGH (ref 0.0–1.2)
CO2: 21 mmol/L (ref 20–29)
Calcium: 9 mg/dL (ref 8.7–10.2)
Chloride: 104 mmol/L (ref 96–106)
Creatinine, Ser: 1.07 mg/dL — ABNORMAL HIGH (ref 0.57–1.00)
Globulin, Total: 2.5 g/dL (ref 1.5–4.5)
Glucose: 84 mg/dL (ref 70–99)
Potassium: 4.5 mmol/L (ref 3.5–5.2)
Sodium: 137 mmol/L (ref 134–144)
Total Protein: 6.7 g/dL (ref 6.0–8.5)
eGFR: 73 mL/min/{1.73_m2} (ref >59)

## 2023-07-17 LAB — CBC
Hematocrit: 43.2 % (ref 34.0–46.6)
Hemoglobin: 14 g/dL (ref 11.1–15.9)
MCH: 31.3 pg (ref 26.6–33.0)
MCHC: 32.4 g/dL (ref 31.5–35.7)
MCV: 97 fL (ref 79–97)
Platelets: 308 10*3/uL (ref 150–450)
RBC: 4.47 x10E6/uL (ref 3.77–5.28)
RDW: 12.1 % (ref 11.7–15.4)
WBC: 4.6 10*3/uL (ref 3.4–10.8)

## 2023-07-17 LAB — VITAMIN D 25 HYDROXY (VIT D DEFICIENCY, FRACTURES): Vit D, 25-Hydroxy: 23.1 ng/mL — ABNORMAL LOW (ref 30.0–100.0)

## 2023-07-17 LAB — FERRITIN: Ferritin: 44 ng/mL (ref 15–150)

## 2023-07-17 LAB — TSH RFX ON ABNORMAL TO FREE T4: TSH: 1.82 u[IU]/mL (ref 0.450–4.500)

## 2023-07-18 MED ORDER — VITAMIN D (ERGOCALCIFEROL) 1.25 MG (50000 UNIT) PO CAPS: 50000.0000 [IU] | ORAL_CAPSULE | ORAL | 0 refills | Status: AC

## 2023-07-18 NOTE — Addendum Note (Signed)
Addended by: Levin Erp on: 07/18/2023 02:22 PM   Modules accepted: Orders

## 2023-07-19 ENCOUNTER — Other Ambulatory Visit: Payer: Medicaid Other

## 2023-07-19 ENCOUNTER — Encounter: Payer: Self-pay | Admitting: Student

## 2023-07-19 DIAGNOSIS — Z13220 Encounter for screening for lipoid disorders: Secondary | ICD-10-CM

## 2023-07-19 DIAGNOSIS — J309 Allergic rhinitis, unspecified: Secondary | ICD-10-CM

## 2023-07-19 DIAGNOSIS — Z113 Encounter for screening for infections with a predominantly sexual mode of transmission: Secondary | ICD-10-CM

## 2023-07-20 LAB — HIV ANTIBODY (ROUTINE TESTING W REFLEX): HIV Screen 4th Generation wRfx: NONREACTIVE

## 2023-07-20 LAB — HCV AB W REFLEX TO QUANT PCR: HCV Ab: NONREACTIVE

## 2023-07-20 LAB — LIPID PANEL
Chol/HDL Ratio: 2.5 ratio (ref 0.0–4.4)
Cholesterol, Total: 201 mg/dL — ABNORMAL HIGH (ref 100–199)
HDL: 81 mg/dL (ref >39)
LDL Chol Calc (NIH): 111 mg/dL — ABNORMAL HIGH (ref 0–99)
Triglycerides: 49 mg/dL (ref 0–149)
VLDL Cholesterol Cal: 9 mg/dL (ref 5–40)

## 2023-07-20 LAB — HCV INTERPRETATION

## 2023-07-20 MED ORDER — FLUTICASONE PROPIONATE 50 MCG/ACT NA SUSP: 2.0000 | Freq: Every day | NASAL | 1 refills | Status: DC

## 2023-07-22 DIAGNOSIS — Z419 Encounter for procedure for purposes other than remedying health state, unspecified: Secondary | ICD-10-CM | POA: Diagnosis not present

## 2023-08-03 ENCOUNTER — Other Ambulatory Visit: Payer: Self-pay

## 2023-08-03 ENCOUNTER — Ambulatory Visit (INDEPENDENT_AMBULATORY_CARE_PROVIDER_SITE_OTHER): Payer: Medicaid Other | Admitting: Family Medicine

## 2023-08-03 ENCOUNTER — Encounter: Payer: Self-pay | Admitting: Family Medicine

## 2023-08-03 VITALS — BP 120/76 | HR 60 | Ht 66.0 in | Wt 225.6 lb

## 2023-08-03 DIAGNOSIS — H8110 Benign paroxysmal vertigo, unspecified ear: Secondary | ICD-10-CM | POA: Insufficient documentation

## 2023-08-03 DIAGNOSIS — G43709 Chronic migraine without aura, not intractable, without status migrainosus: Secondary | ICD-10-CM | POA: Insufficient documentation

## 2023-08-03 MED ORDER — SUMATRIPTAN SUCCINATE 50 MG PO TABS
50.0000 mg | ORAL_TABLET | ORAL | 0 refills | Status: AC | PRN
Start: 2023-08-03 — End: ?

## 2023-08-03 NOTE — Assessment & Plan Note (Addendum)
Given character of headaches, associated nausea as well as photophobia and phonophobia, migraine is most likely etiology.  Cluster headache less likely given lack of autonomic symptoms presence of nausea.  Not consistent cervicogenic or tension type headache.  No red flag symptoms for concerning intracranial pathology.  Reassuring neurological exam. -Trial of abortive therapy with sumatriptan 50 mg at start of headache -Educated patient on risk of teratogenic effects of sumatriptan, established she is not sexually active at this time -Return if not improving

## 2023-08-03 NOTE — Assessment & Plan Note (Addendum)
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  Orthostatic vitals negative.  Not consistent with Mnire's disease given lack of ringing or hearing loss.  No concern for stroke, neurological exam unremarkable.  No concern for anemia given recent normal CBC and ferritin.  Unclear etiology of weakness, suspect patient experiences this sensation with vertigo. -Epley maneuver explained and provided handout -Referral to vestibular rehab PT further education -Follow-up if not improved following use of Epley maneuver
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
## 2023-08-03 NOTE — Patient Instructions (Addendum)
It was great to see you today! Thank you for choosing [facility] for your primary care.  Today we addressed: Headaches Please start taking the sumatriptan for your headaches.  Take it immediately at the start of the headache, the sooner the better.  You do not need to take it daily, only when you have a headache.  Cutting back on caffeine and alcohol will also help.  Some more information about headache management is below.  Dizziness I think you have some inner ear dysfunction called benign positional vertigo.  It has to do with little crystals in your ear getting disturbed and moving around.  Below are some exercises that can help "reset" your inner ear.  I am also providing a referral for vestibular rehab who can help teach you this exercise. You can create a diary of your symptoms to note when they come on and what else you are feeling/what else is going on at the time.  Additionally, make sure to hydrate well.  You should return to our clinic as needed if symptoms are not improving.  Thank you for coming to see [place] at [facility] and for the opportunity to care for you! Tekisha Darcey, MD 08/03/2023, 3:35 PM    HEADACHES 2. Dietary changes:  a. EAT REGULAR MEALS- avoid missing meals meaning > 5hrs during the day or >13 hrs overnight.  b. LEARN TO RECOGNIZE TRIGGER FOODS such as: caffeine, cheddar cheese, chocolate, red meat, dairy products, vinegar, bacon, hotdogs, pepperoni, bologna, deli meats, smoked fish, sausages. Food with MSG= dry roasted nuts, [place] food, soy sauce.  3. DRINK PLENTY OF WATER:        64 oz of water is recommended for adults.  Also be sure to avoid caffeine.   4. GET ADEQUATE REST.  School age children need 9-11 hours of sleep and teenagers need 8-10 hours sleep.  Remember, too much sleep (daytime naps), and too little sleep may trigger headaches. Develop and keep bedtime routines.  5.  RECOGNIZE OTHER CAUSES OF HEADACHE: Address Anxiety,  depression, allergy and sinus disease and/or vision problems as these contribute to headaches. Other triggers include over-exertion, loud noise, weather changes, strong odors, secondhand smoke, chemical fumes, motion or travel, medication, hormone changes & monthly cycles.  7. PROVIDE CONSISTENT Daily routines:  exercise, meals, sleep  8. KEEP Headache Diary to record frequency, severity, triggers, and monitor treatments.  9. AVOID OVERUSE of over the counter medications (acetaminophen, ibuprofen, naproxen) to treat headache may result in rebound headaches. Don't take more than 3-4 doses of one medication in a week time.  10. TAKE daily medications as prescribed

## 2023-08-21 DIAGNOSIS — Z419 Encounter for procedure for purposes other than remedying health state, unspecified: Secondary | ICD-10-CM | POA: Diagnosis not present

## 2023-08-22 ENCOUNTER — Other Ambulatory Visit: Payer: Self-pay | Admitting: Student

## 2023-08-22 DIAGNOSIS — J309 Allergic rhinitis, unspecified: Secondary | ICD-10-CM

## 2023-08-24 ENCOUNTER — Ambulatory Visit (INDEPENDENT_AMBULATORY_CARE_PROVIDER_SITE_OTHER): Payer: Medicaid Other | Admitting: Student

## 2023-08-24 ENCOUNTER — Other Ambulatory Visit (HOSPITAL_COMMUNITY)
Admission: RE | Admit: 2023-08-24 | Discharge: 2023-08-24 | Disposition: A | Discharge status: Home / Self Care | Payer: Medicaid Other | Source: Ambulatory Visit | Attending: Family Medicine | Admitting: Family Medicine

## 2023-08-24 ENCOUNTER — Encounter: Payer: Self-pay | Admitting: Student

## 2023-08-24 VITALS — BP 113/73 | HR 75 | Ht 64.0 in | Wt 222.8 lb

## 2023-08-24 DIAGNOSIS — Z113 Encounter for screening for infections with a predominantly sexual mode of transmission: Secondary | ICD-10-CM | POA: Insufficient documentation

## 2023-08-24 DIAGNOSIS — N898 Other specified noninflammatory disorders of vagina: Secondary | ICD-10-CM

## 2023-08-24 DIAGNOSIS — Z23 Encounter for immunization: Secondary | ICD-10-CM | POA: Diagnosis not present

## 2023-08-24 LAB — POCT URINE PREGNANCY: Preg Test, Ur: NEGATIVE

## 2023-08-24 NOTE — Patient Instructions (Addendum)
It was great to see you! Thank you for allowing me to participate in your care!   I recommend that you always bring your medications to each appointment as this makes it easy to ensure we are on the correct medications and helps [place] not miss when refills are needed.  Our plans for today:  - I will let you know what your swabs and results show - Use protection with intercourse - stay hydrated as much as possible - try some probiotics for women as well  Take care and seek immediate care sooner if you develop any concerns. Please remember to show up 15 minutes before your scheduled appointment time!  Levin Erp, MD [facility]

## 2023-08-24 NOTE — Progress Notes (Addendum)
    SUBJECTIVE:   CHIEF COMPLAINT / HPI:   Dry Throat No recent oral sex, would like this swabbed though to rule this out. No pain in throat. Not coughing much. No one sick around her.  No fevers.  Vaginal Discharge: Patient is a 27 y.o. female presenting with vaginal discharge for 14 days.  She states the discharge is of white, clumpy consistency.  She endorses different vaginal odor.  She is interested in screening for sexually transmitted infections today. She has contraception with none-not interested currently She does not use barrier method consistently.  Conncered about HSV-wondering if can be tested, maybe noticed a bump No active lesions  PERTINENT  PMH / PSH: None relevant  OBJECTIVE:   BP 113/73   Pulse 75   Ht 5\' 6"  (1.676 m)   Wt 222 lb 12.8 oz (101.1 kg)   LMP 08/08/2023   SpO2 100%   BMI 35.96 kg/m    General: NAD, pleasant, able to participate in exam Head/Neck: No lesions, ulcers, exudates, mild petechiae of the soft palate Respiratory: Normal effort, no obvious respiratory distress Pelvic: VULVA: normal appearing vulva with no masses, tenderness or lesions, VAGINA: Normal appearing vagina with normal color, no lesions, with white discharge present, CERVIX: No lesions, white discharge present; small foliculitis near anus - no pain when palpating, no vesicles or ulcers  Chaperone Cleatrice Burke CMA present for pelvic exam  ASSESSMENT/PLAN:   Dry Throat Unclear cause.  Will swab for STIs just in case.  Does have mild petechiae on her soft palate although she has not had any lymphadenopathy or sore throat or exudates that would make me think a strep infection.  Discussed staying very well-hydrated in the interim and that there could be some component of opening mouth when sleeping. -Swab for trich/GC -ED/return precuations discussed  Vaginal Discharge 27 y.o. female with vaginal discharge for 14 days  Physical exam significant for whitish discharge.  Vaginal  swabs performed today with  STI screening.   Plan: -Vaginal swabs sent -HIV, RPR, Hep C -POCT UPreg -Discussed protection during intercourse and contraceptive methods -Follow-up as needed  Levin Erp, MD [facility]

## 2023-08-24 NOTE — Addendum Note (Signed)
Addended by: Levin Erp on: 08/24/2023 03:45 PM   Modules accepted: Orders

## 2023-08-25 LAB — HCV INTERPRETATION

## 2023-08-25 LAB — HCV AB W REFLEX TO QUANT PCR: HCV Ab: NONREACTIVE

## 2023-08-25 LAB — HIV ANTIBODY (ROUTINE TESTING W REFLEX): HIV Screen 4th Generation wRfx: NONREACTIVE

## 2023-08-25 LAB — RPR: RPR Ser Ql: NONREACTIVE

## 2023-08-27 ENCOUNTER — Telehealth: Payer: Self-pay | Admitting: Student

## 2023-08-27 DIAGNOSIS — B379 Candidiasis, unspecified: Secondary | ICD-10-CM

## 2023-08-27 DIAGNOSIS — B9689 Other specified bacterial agents as the cause of diseases classified elsewhere: Secondary | ICD-10-CM

## 2023-08-27 DIAGNOSIS — Z113 Encounter for screening for infections with a predominantly sexual mode of transmission: Secondary | ICD-10-CM | POA: Diagnosis not present

## 2023-08-27 LAB — CERVICOVAGINAL ANCILLARY ONLY
Bacterial Vaginitis (gardnerella): POSITIVE — AB
Candida Glabrata: NEGATIVE
Candida Vaginitis: POSITIVE — AB
Chlamydia: NEGATIVE
Chlamydia: NEGATIVE
Comment: NEGATIVE
Comment: NEGATIVE
Comment: NEGATIVE
Comment: NEGATIVE
Comment: NEGATIVE
Comment: NORMAL
Comment: NEGATIVE
Comment: NEGATIVE
Comment: NORMAL
Neisseria Gonorrhea: NEGATIVE
Neisseria Gonorrhea: NEGATIVE
Trichomonas: NEGATIVE
Trichomonas: NEGATIVE

## 2023-08-27 MED ORDER — FLUCONAZOLE 150 MG PO TABS
150.0000 mg | ORAL_TABLET | ORAL | 0 refills | Status: DC
Start: 2023-08-27 — End: 2024-03-27

## 2023-08-27 MED ORDER — METRONIDAZOLE 500 MG PO TABS
500.0000 mg | ORAL_TABLET | Freq: Two times a day (BID) | ORAL | 0 refills | Status: AC
Start: 2023-08-27 — End: 2023-09-03

## 2023-08-27 NOTE — Telephone Encounter (Signed)
Called patient and got VM x2. Left VM that patient should call back clinic.  Her swab was positive for BV and yeast--if amenable will sending in flagyl course for her to take for 7 days and after that can take a yeast pill.   Please let me know if ok with this if she returns call.

## 2023-08-27 NOTE — Telephone Encounter (Signed)
Patient returns call to nurse line. Advised of message per Dr. Laroy Apple.   She is agreeable to treatment being sent to [facility] on [address].   Veronda Prude, RN

## 2023-08-27 NOTE — Addendum Note (Signed)
Addended by: Levin Erp on: 08/27/2023 02:52 PM   Modules accepted: Orders

## 2023-09-13 ENCOUNTER — Ambulatory Visit (INDEPENDENT_AMBULATORY_CARE_PROVIDER_SITE_OTHER): Payer: Medicaid Other | Admitting: Family Medicine

## 2023-09-13 ENCOUNTER — Other Ambulatory Visit (HOSPITAL_COMMUNITY)
Admission: RE | Admit: 2023-09-13 | Discharge: 2023-09-13 | Disposition: A | Discharge status: Home / Self Care | Payer: Medicaid Other | Source: Ambulatory Visit | Attending: Family Medicine | Admitting: Family Medicine

## 2023-09-13 VITALS — BP 107/73 | HR 78 | Ht 63.0 in | Wt 227.8 lb

## 2023-09-13 DIAGNOSIS — Z113 Encounter for screening for infections with a predominantly sexual mode of transmission: Secondary | ICD-10-CM | POA: Diagnosis not present

## 2023-09-13 DIAGNOSIS — Z2981 Encounter for HIV pre-exposure prophylaxis: Secondary | ICD-10-CM

## 2023-09-13 DIAGNOSIS — Z114 Encounter for screening for human immunodeficiency virus [HIV]: Secondary | ICD-10-CM

## 2023-09-13 DIAGNOSIS — Z7189 Other specified counseling: Secondary | ICD-10-CM | POA: Diagnosis not present

## 2023-09-13 LAB — POCT WET PREP (WET MOUNT)
Clue Cells Wet Prep Whiff POC: POSITIVE
Trichomonas Wet Prep HPF POC: ABSENT

## 2023-09-13 LAB — CERVICOVAGINAL ANCILLARY ONLY
Chlamydia: NEGATIVE
Comment: NEGATIVE
Comment: NORMAL
Neisseria Gonorrhea: NEGATIVE

## 2023-09-13 MED ORDER — METRONIDAZOLE 500 MG PO TABS: 500.0000 mg | ORAL_TABLET | Freq: Two times a day (BID) | ORAL | 0 refills | Status: AC

## 2023-09-13 MED ORDER — DOXYCYCLINE HYCLATE 100 MG PO TABS: 200.0000 mg | ORAL_TABLET | ORAL | 0 refills | Status: DC | PRN

## 2023-09-13 NOTE — Patient Instructions (Addendum)
It was great to see you! Thank you for allowing me to participate in your care!  Our plans for today:  - I will let you know the results of your testing today. - Have the medicine doxycyline to your pharmacy. You may take 200mg  as needed after intercourse in the next 72hrs to help prevent sexually transmitted infections. - Please call the clinic if you have any other concerns. - Please consider scheduling an earlier appointment to discuss birth control, otherwise please follow up in 3 months.   Please arrive 15 minutes PRIOR to your next scheduled appointment time! If you do not, this affects OTHER patients' care.  Take care and seek immediate care sooner if you develop any concerns.   Celine Mans, MD, PGY-2 [facility] 9:29 AM 09/13/2023  [facility]

## 2023-09-13 NOTE — Progress Notes (Signed)
    SUBJECTIVE:   CHIEF COMPLAINT / HPI:   STI concern -Last sexual encounter 2-3 weeks ago -partner "sketchy" not want to get test -No vaginal discharge -States clitoris is "sensitive" -Mild itchiness -No foul odor -No birth control -Occasionally using condoms -3 partners in the past 6 months  PERTINENT  PMH / PSH: Abnormal Uterine bleeding, Migraines, MDD  OBJECTIVE:   BP 107/73   Pulse 78   Ht 5\' 3"  (1.6 m)   Wt 227 lb 12.8 oz (103.3 kg)   LMP 09/03/2023   SpO2 100%   BMI 40.35 kg/m   General: NAD, well appearing Neuro: A&O Respiratory: normal WOB on RA Extremities: Moving all 4 extremities equally GU: Chaperone present. Drusilla Kanner. Normal external genitalia, no obvious erythema, swelling, or lesions. No obvious vaginal discharge, vaginal walls without erythema or lesions. Cervix not visualized.   ASSESSMENT/PLAN:   Assessment & Plan Screening for STD (sexually transmitted disease) Testing as below discussed with patient. Will treat as indicated.  Counseled on safe sexual practices. -RPR, HIV -Wet prep -Gonorrhea and Chlamydia testing  Wet prep positive for BV. Called patient, updated regarding results.  -Flagyll 500mg  7 days BID sent to pharmacy. Encounter for HIV screening and discussion of pre-exposure prophylaxis for HIV Patient at high risk (greater than 3 partners past 6 months, intermittent condom use).  Discussed prophylactic HIV and STI therapy with patient.  She is agreeable to both.  Plan below. -Doxycycline 200 mg as needed after intercourse -CBC, CMP, hep B, hep C hep C -Will prescribe Truvada pending lab results -Follow-up 3 months  Return in about 3 months (around 12/14/2023).  Celine Mans, MD [facility]

## 2023-09-14 LAB — COMPREHENSIVE METABOLIC PANEL
ALT: 22 [IU]/L (ref 0–32)
AST: 29 [IU]/L (ref 0–40)
Albumin: 4.3 g/dL (ref 4.0–5.0)
Alkaline Phosphatase: 58 [IU]/L (ref 44–121)
BUN/Creatinine Ratio: 9 (ref 9–23)
BUN: 11 mg/dL (ref 6–20)
Bilirubin Total: 1.3 mg/dL — ABNORMAL HIGH (ref 0.0–1.2)
CO2: 22 mmol/L (ref 20–29)
Calcium: 9.5 mg/dL (ref 8.7–10.2)
Chloride: 102 mmol/L (ref 96–106)
Creatinine, Ser: 1.17 mg/dL — ABNORMAL HIGH (ref 0.57–1.00)
Globulin, Total: 2.8 g/dL (ref 1.5–4.5)
Glucose: 87 mg/dL (ref 70–99)
Potassium: 4.3 mmol/L (ref 3.5–5.2)
Sodium: 137 mmol/L (ref 134–144)
Total Protein: 7.1 g/dL (ref 6.0–8.5)
eGFR: 65 mL/min/{1.73_m2} (ref >59)

## 2023-09-14 LAB — RPR W/REFLEX TO TREPSURE: RPR: NONREACTIVE

## 2023-09-14 LAB — HEPATITIS B SURFACE ANTIGEN: Hepatitis B Surface Ag: NEGATIVE

## 2023-09-14 LAB — HIV ANTIBODY (ROUTINE TESTING W REFLEX): HIV Screen 4th Generation wRfx: NONREACTIVE

## 2023-09-14 LAB — CBC
Hematocrit: 43.4 % (ref 34.0–46.6)
Hemoglobin: 14.2 g/dL (ref 11.1–15.9)
MCH: 31.2 pg (ref 26.6–33.0)
MCHC: 32.7 g/dL (ref 31.5–35.7)
MCV: 95 fL (ref 79–97)
Platelets: 314 x10E3/uL (ref 150–450)
RBC: 4.55 x10E6/uL (ref 3.77–5.28)
RDW: 12.1 % (ref 11.7–15.4)
WBC: 4 x10E3/uL (ref 3.4–10.8)

## 2023-09-14 LAB — HCV AB W REFLEX TO QUANT PCR: HCV Ab: NONREACTIVE

## 2023-09-14 LAB — TREPONEMAL ANTIBODIES, TPPA: Treponemal Antibodies, TPPA: NONREACTIVE

## 2023-09-14 LAB — HCV INTERPRETATION

## 2023-09-18 ENCOUNTER — Other Ambulatory Visit: Payer: Self-pay | Admitting: Family Medicine

## 2023-09-18 ENCOUNTER — Encounter: Payer: Self-pay | Admitting: Family Medicine

## 2023-09-18 DIAGNOSIS — Z2981 Encounter for HIV pre-exposure prophylaxis: Secondary | ICD-10-CM

## 2023-09-18 MED ORDER — EMTRICITABINE-TENOFOVIR DF 200-300 MG PO TABS: 1.0000 | ORAL_TABLET | Freq: Every day | ORAL | 0 refills | Status: DC

## 2023-09-21 DIAGNOSIS — Z419 Encounter for procedure for purposes other than remedying health state, unspecified: Secondary | ICD-10-CM | POA: Diagnosis not present

## 2023-10-21 DIAGNOSIS — Z419 Encounter for procedure for purposes other than remedying health state, unspecified: Secondary | ICD-10-CM | POA: Diagnosis not present

## 2023-10-22 ENCOUNTER — Ambulatory Visit (INDEPENDENT_AMBULATORY_CARE_PROVIDER_SITE_OTHER): Payer: Medicaid Other | Admitting: Student

## 2023-10-22 ENCOUNTER — Other Ambulatory Visit (HOSPITAL_COMMUNITY)
Admission: RE | Admit: 2023-10-22 | Discharge: 2023-10-22 | Disposition: A | Discharge status: Home / Self Care | Payer: Medicaid Other | Source: Ambulatory Visit | Attending: Family Medicine | Admitting: Family Medicine

## 2023-10-22 ENCOUNTER — Encounter: Payer: Self-pay | Admitting: Student

## 2023-10-22 VITALS — BP 110/70 | HR 85 | Ht 64.0 in | Wt 229.6 lb

## 2023-10-22 DIAGNOSIS — N898 Other specified noninflammatory disorders of vagina: Secondary | ICD-10-CM | POA: Diagnosis not present

## 2023-10-22 DIAGNOSIS — Z2981 Encounter for HIV pre-exposure prophylaxis: Secondary | ICD-10-CM | POA: Diagnosis not present

## 2023-10-22 DIAGNOSIS — Z113 Encounter for screening for infections with a predominantly sexual mode of transmission: Secondary | ICD-10-CM | POA: Insufficient documentation

## 2023-10-22 LAB — POCT URINE PREGNANCY: Preg Test, Ur: NEGATIVE

## 2023-10-22 MED ORDER — EMTRICITABINE-TENOFOVIR DF 200-300 MG PO TABS: 1.0000 | ORAL_TABLET | Freq: Every day | ORAL | 0 refills | Status: DC

## 2023-10-22 NOTE — Assessment & Plan Note (Addendum)
Has been having vaginal irritation and throat irritation as well with new sexual partner. Upreg negative -G/C, BV.trich, yeast swab of vagina -G/C, trich swab of throat -Encouraged contraception and barrier methods

## 2023-10-22 NOTE — Progress Notes (Signed)
    SUBJECTIVE:   CHIEF COMPLAINT / HPI:   Throat pain and genital irritation. The throat pain has been present for approximately four to five days, and the genital irritation for about a week. She denies fever and dysuria. She has recently resumed sexual activity with a previous partner. She is not currently on any form of birth control and is not interested in starting any at this time. She has not yet started a medication previously prescribed by Dr. Velna Ochs (Truvada), but requests a new prescription as she forgot to pick up the original one. She agrees to blood tests.  PERTINENT  PMH / PSH: None relevant  OBJECTIVE:   BP 110/70   Pulse 85   Ht 5\' 4"  (1.626 m)   Wt 229 lb 9.6 oz (104.1 kg)   LMP 10/01/2023   SpO2 99%   BMI 39.41 kg/m    General: NAD, pleasant, able to participate in exam Head:  Throat nonerythematous and no exudates present Respiratory: Normal effort, no obvious respiratory distress Pelvic: VULVA: normal appearing vulva with no masses, tenderness or lesions, VAGINA: Normal appearing vagina with normal color, no lesions, with scant discharge present, CERVIX: No lesions, scant discharge present  Chaperone Glori Bickers CMA present for pelvic exam  ASSESSMENT/PLAN:   Assessment & Plan Vaginal irritation Has been having vaginal irritation and throat irritation as well with new sexual partner. Upreg negative -G/C, BV.trich, yeast swab of vagina -G/C, trich swab of throat -Encouraged contraception and barrier methods Encounter for HIV pre-exposure prophylaxis PrEP was previously prescribed however patient never picked it up. She is will to start again today - HIV antibody (with reflex) - RPR - POCT urine pregnancy - Comprehensive metabolic panel - CBC - Hepatitis B surface antigen - Hepatitis B surface antibody,qualitative - Hepatitis C antibody (reflex, frozen specimen) - Lipid Panel - emtricitabine-tenofovir (TRUVADA) 200-300 MG tablet; Take 1 tablet by  mouth daily.  Dispense: 90 tablet; Refill: 0 (Instructed patient to wait until labs come back before starting) - 3 month f/u    Levin Erp, MD [facility]

## 2023-10-22 NOTE — Patient Instructions (Signed)
It was great to see you! Thank you for allowing me to participate in your care!   Our plans for today:  - I am sending in truvada but please wait until I let you know if it is okay to take this medicine after your labs today - I will let you know what your swabs show!  Take care and seek immediate care sooner if you develop any concerns.  Levin Erp, MD

## 2023-10-23 LAB — COMPREHENSIVE METABOLIC PANEL
ALT: 17 [IU]/L (ref 0–32)
AST: 23 [IU]/L (ref 0–40)
Albumin: 4.2 g/dL (ref 4.0–5.0)
Alkaline Phosphatase: 58 [IU]/L (ref 44–121)
BUN/Creatinine Ratio: 7 — ABNORMAL LOW (ref 9–23)
BUN: 9 mg/dL (ref 6–20)
Bilirubin Total: 1.3 mg/dL — ABNORMAL HIGH (ref 0.0–1.2)
CO2: 20 mmol/L (ref 20–29)
Calcium: 9.5 mg/dL (ref 8.7–10.2)
Chloride: 101 mmol/L (ref 96–106)
Creatinine, Ser: 1.28 mg/dL — ABNORMAL HIGH (ref 0.57–1.00)
Globulin, Total: 2.8 g/dL (ref 1.5–4.5)
Glucose: 87 mg/dL (ref 70–99)
Potassium: 4 mmol/L (ref 3.5–5.2)
Sodium: 138 mmol/L (ref 134–144)
Total Protein: 7 g/dL (ref 6.0–8.5)
eGFR: 59 mL/min/{1.73_m2} — ABNORMAL LOW (ref >59)

## 2023-10-23 LAB — LIPID PANEL
Chol/HDL Ratio: 2 {ratio} (ref 0.0–4.4)
Cholesterol, Total: 190 mg/dL (ref 100–199)
HDL: 94 mg/dL (ref >39)
LDL Chol Calc (NIH): 88 mg/dL (ref 0–99)
Triglycerides: 40 mg/dL (ref 0–149)
VLDL Cholesterol Cal: 8 mg/dL (ref 5–40)

## 2023-10-23 LAB — CBC
Hematocrit: 41.6 % (ref 34.0–46.6)
Hemoglobin: 13.6 g/dL (ref 11.1–15.9)
MCH: 31.2 pg (ref 26.6–33.0)
MCHC: 32.7 g/dL (ref 31.5–35.7)
MCV: 95 fL (ref 79–97)
Platelets: 295 10*3/uL (ref 150–450)
RBC: 4.36 x10E6/uL (ref 3.77–5.28)
RDW: 12 % (ref 11.7–15.4)
WBC: 7.7 10*3/uL (ref 3.4–10.8)

## 2023-10-23 LAB — HIV ANTIBODY (ROUTINE TESTING W REFLEX): HIV Screen 4th Generation wRfx: NONREACTIVE

## 2023-10-23 LAB — HEPATITIS B SURFACE ANTIGEN: Hepatitis B Surface Ag: NEGATIVE

## 2023-10-23 LAB — RPR: RPR Ser Ql: NONREACTIVE

## 2023-10-23 LAB — HCV AB W REFLEX TO QUANT PCR: HCV Ab: NONREACTIVE

## 2023-10-23 LAB — HCV INTERPRETATION

## 2023-10-24 ENCOUNTER — Telehealth: Payer: Self-pay | Admitting: Student

## 2023-10-24 DIAGNOSIS — B9689 Other specified bacterial agents as the cause of diseases classified elsewhere: Secondary | ICD-10-CM

## 2023-10-24 DIAGNOSIS — J309 Allergic rhinitis, unspecified: Secondary | ICD-10-CM

## 2023-10-24 LAB — CERVICOVAGINAL ANCILLARY ONLY
Bacterial Vaginitis (gardnerella): POSITIVE — AB
Candida Glabrata: NEGATIVE
Candida Vaginitis: NEGATIVE
Chlamydia: NEGATIVE
Chlamydia: NEGATIVE
Comment: NEGATIVE
Comment: NEGATIVE
Comment: NEGATIVE
Comment: NEGATIVE
Comment: NEGATIVE
Comment: NEGATIVE
Comment: NEGATIVE
Comment: NORMAL
Comment: NORMAL
Neisseria Gonorrhea: NEGATIVE
Neisseria Gonorrhea: NEGATIVE
Trichomonas: NEGATIVE
Trichomonas: NEGATIVE

## 2023-10-24 MED ORDER — FLUTICASONE PROPIONATE 50 MCG/ACT NA SUSP: 2.0000 | Freq: Every day | NASAL | 1 refills | Status: AC

## 2023-10-24 MED ORDER — METRONIDAZOLE 500 MG PO TABS: 500.0000 mg | ORAL_TABLET | Freq: Two times a day (BID) | ORAL | 0 refills | Status: AC

## 2023-10-24 NOTE — Telephone Encounter (Signed)
Called patient confirmed date of birth.  Labs overall within normal limits except for creatinine elevation.  Discussed that we will need to recheck her kidney function in 2 weeks. Lab appointment scheduled 12/18.  Discussed frequent hydration in this meantime.  Also discussed that she should be fine to start her Truvada.  Discussed that she has BV and I can send in Flagyl for her and to avoid alcohol with this medication. Request flonase refill as well.

## 2023-11-07 ENCOUNTER — Other Ambulatory Visit: Payer: Self-pay

## 2023-11-07 ENCOUNTER — Other Ambulatory Visit: Payer: Self-pay | Admitting: Student

## 2023-11-07 DIAGNOSIS — R7989 Other specified abnormal findings of blood chemistry: Secondary | ICD-10-CM

## 2023-11-08 DIAGNOSIS — F331 Major depressive disorder, recurrent, moderate: Secondary | ICD-10-CM | POA: Diagnosis not present

## 2023-11-08 DIAGNOSIS — F411 Generalized anxiety disorder: Secondary | ICD-10-CM | POA: Diagnosis not present

## 2023-11-15 ENCOUNTER — Other Ambulatory Visit (HOSPITAL_COMMUNITY)
Admission: RE | Admit: 2023-11-15 | Discharge: 2023-11-15 | Disposition: A | Discharge status: Home / Self Care | Payer: Medicaid Other | Source: Ambulatory Visit | Attending: Family Medicine | Admitting: Family Medicine

## 2023-11-15 ENCOUNTER — Ambulatory Visit: Payer: Medicaid Other | Admitting: Student

## 2023-11-15 VITALS — BP 104/73 | HR 70 | Ht 63.0 in | Wt 229.4 lb

## 2023-11-15 DIAGNOSIS — Z113 Encounter for screening for infections with a predominantly sexual mode of transmission: Secondary | ICD-10-CM

## 2023-11-15 DIAGNOSIS — B9689 Other specified bacterial agents as the cause of diseases classified elsewhere: Secondary | ICD-10-CM | POA: Diagnosis not present

## 2023-11-15 DIAGNOSIS — N898 Other specified noninflammatory disorders of vagina: Secondary | ICD-10-CM | POA: Insufficient documentation

## 2023-11-15 DIAGNOSIS — N760 Acute vaginitis: Secondary | ICD-10-CM

## 2023-11-15 DIAGNOSIS — R7989 Other specified abnormal findings of blood chemistry: Secondary | ICD-10-CM | POA: Diagnosis not present

## 2023-11-15 LAB — POCT WET PREP (WET MOUNT)
Clue Cells Wet Prep Whiff POC: POSITIVE
Trichomonas Wet Prep HPF POC: ABSENT

## 2023-11-15 MED ORDER — METRONIDAZOLE 0.75 % VA GEL: 1.0000 | Freq: Every day | VAGINAL | 0 refills | Status: AC

## 2023-11-15 NOTE — Progress Notes (Signed)
    SUBJECTIVE:   CHIEF COMPLAINT / HPI:    Elevated creatinine Incidentally found about 3 weeks ago with creatinine 1.28, baseline appears to be ~1  Increased vaginal discharge Patient reports increased whitish discharge for the past few days with an odor.  Denies abdominal pain, fevers.  No new sexual partner since she last had STD testing on 10/22/2023 which was negative.  Does not use condoms, is not on birth control.  Does not want to be on birth control.  LMP 10/29/2023 and was a normal period. Did test positive for BV on 10/22/2023 and completed course of metronidazole.  Would like repeat STD testing done today including HIV and RPR although all tests negative earlier this month.   PERTINENT  PMH / PSH: None pertinent  OBJECTIVE:   BP 104/73   Pulse 70   Ht 5\' 3"  (1.6 m)   Wt 229 lb 6.4 oz (104.1 kg)   LMP 10/01/2023   SpO2 100%   BMI 40.64 kg/m    General: NAD, pleasant, able to participate in exam Cardiac: Well-perfused Respiratory: Breathing comfortably on room air GU: Chaperoned by CMA.  Normal external female genitalia with no lesions, pink moist vaginal mucosa, normal-appearing cervix with no masses or lesions, scant white discharge in vaginal canal Skin: warm and dry Neuro: alert, no obvious focal deficits Psych: Normal affect and mood  ASSESSMENT/PLAN:   Elevated serum creatinine No known risk factors for kidney disease at this time.   -BMP -Urine mACR -If Cr still elevated, consider renal US  Bacterial vaginosis Wet prep positive for BV which is likely cause of increased discharge with odor.  Patient prefers MetroGel versus metronidazole tablets as they previously made her feel nauseous. -Rx MetroGel for 5 days at bedtime  Routine screening for STI (sexually transmitted infection) Per history and exam, low concern for PID.  Pt does not want to use condoms and not on BC.  No pregnancy test today as LMP was less than 3 weeks ago and no concerning pregnancy  symptoms at this time.  -Wet prep neg for trich -Swab for GC chlamydia and trichomoniasis -RPR and HIV     Dr. Erick Alley, DO [facility]

## 2023-11-15 NOTE — Patient Instructions (Signed)
It was great to see you! Thank you for allowing me to participate in your care!   We are checking some labs today, I will call you if they are abnormal will send you a MyChart message or a letter if they are normal.  If you do not hear about your labs in the next 2 weeks please let us know.  Take care and seek immediate care sooner if you develop any concerns.   Dr. Sarah Jones, DO [facility]  

## 2023-11-15 NOTE — Assessment & Plan Note (Addendum)
Wet prep positive for BV which is likely cause of increased discharge with odor.  Patient prefers MetroGel versus metronidazole tablets as they previously made her feel nauseous. -Rx MetroGel for 5 days at bedtime

## 2023-11-15 NOTE — Assessment & Plan Note (Signed)
No known risk factors for kidney disease at this time.   -BMP -Urine mACR -If Cr still elevated, consider renal US

## 2023-11-15 NOTE — Assessment & Plan Note (Signed)
Per history and exam, low concern for PID.  Pt does not want to use condoms and not on BC.  No pregnancy test today as LMP was less than 3 weeks ago and no concerning pregnancy symptoms at this time.  -Wet prep neg for trich -Swab for GC chlamydia and trichomoniasis -RPR and HIV

## 2023-11-16 ENCOUNTER — Telehealth: Payer: Self-pay | Admitting: Student

## 2023-11-16 LAB — BASIC METABOLIC PANEL
BUN/Creatinine Ratio: 10 (ref 9–23)
BUN: 12 mg/dL (ref 6–20)
CO2: 23 mmol/L (ref 20–29)
Calcium: 9.1 mg/dL (ref 8.7–10.2)
Chloride: 102 mmol/L (ref 96–106)
Creatinine, Ser: 1.24 mg/dL — ABNORMAL HIGH (ref 0.57–1.00)
Glucose: 91 mg/dL (ref 70–99)
Potassium: 4.4 mmol/L (ref 3.5–5.2)
Sodium: 137 mmol/L (ref 134–144)
eGFR: 61 mL/min/{1.73_m2} (ref >59)

## 2023-11-16 LAB — CERVICOVAGINAL ANCILLARY ONLY
Chlamydia: NEGATIVE
Comment: NEGATIVE
Comment: NEGATIVE
Comment: NORMAL
Neisseria Gonorrhea: NEGATIVE
Trichomonas: NEGATIVE

## 2023-11-16 LAB — HIV ANTIBODY (ROUTINE TESTING W REFLEX): HIV Screen 4th Generation wRfx: NONREACTIVE

## 2023-11-16 LAB — RPR: RPR Ser Ql: NONREACTIVE

## 2023-11-16 NOTE — Telephone Encounter (Signed)
Called patient to discuss recent lab results showing cr is still elevated but stable at 1.24.  Urine microalbumin creatinine ratio is not back yet.  Discussed getting renal ultrasound however patient would like to wait for all labs to return before making any other decisions which is reasonable.  If UACR is elevated, would recommend getting renal ultrasound.  STD test came back negative.

## 2023-11-19 LAB — MICROALBUMIN / CREATININE URINE RATIO: Creatinine, Urine: 85.8 mg/dL

## 2023-11-21 DIAGNOSIS — Z419 Encounter for procedure for purposes other than remedying health state, unspecified: Secondary | ICD-10-CM | POA: Diagnosis not present

## 2023-11-26 DIAGNOSIS — F411 Generalized anxiety disorder: Secondary | ICD-10-CM | POA: Diagnosis not present

## 2023-11-26 DIAGNOSIS — F331 Major depressive disorder, recurrent, moderate: Secondary | ICD-10-CM | POA: Diagnosis not present

## 2023-12-19 DIAGNOSIS — F331 Major depressive disorder, recurrent, moderate: Secondary | ICD-10-CM | POA: Diagnosis not present

## 2023-12-19 DIAGNOSIS — F411 Generalized anxiety disorder: Secondary | ICD-10-CM | POA: Diagnosis not present

## 2023-12-22 DIAGNOSIS — Z419 Encounter for procedure for purposes other than remedying health state, unspecified: Secondary | ICD-10-CM | POA: Diagnosis not present

## 2023-12-26 DIAGNOSIS — F411 Generalized anxiety disorder: Secondary | ICD-10-CM | POA: Diagnosis not present

## 2023-12-26 DIAGNOSIS — F331 Major depressive disorder, recurrent, moderate: Secondary | ICD-10-CM | POA: Diagnosis not present

## 2024-01-15 ENCOUNTER — Telehealth: Payer: Self-pay

## 2024-01-15 ENCOUNTER — Other Ambulatory Visit: Payer: Self-pay

## 2024-01-15 ENCOUNTER — Ambulatory Visit (INDEPENDENT_AMBULATORY_CARE_PROVIDER_SITE_OTHER): Payer: Medicaid Other | Admitting: Student

## 2024-01-15 VITALS — BP 105/72 | HR 73 | Ht 63.0 in | Wt 217.2 lb

## 2024-01-15 DIAGNOSIS — R079 Chest pain, unspecified: Secondary | ICD-10-CM | POA: Diagnosis not present

## 2024-01-15 MED ORDER — OMEPRAZOLE 40 MG PO CPDR
40.0000 mg | DELAYED_RELEASE_CAPSULE | Freq: Every day | ORAL | 3 refills | Status: AC
Start: 2024-01-15 — End: ?

## 2024-01-15 NOTE — Progress Notes (Unsigned)
    SUBJECTIVE:   CHIEF COMPLAINT / HPI: Chest pain  28 year old female with history of anxiety, panic attack and depression on currently not on med presenting with 2-3 days of chest pain which started over the weekend.  Describes chest pain as pressure over the chest which is nonradiating.  No other associated symptoms such as upper extremity weakness, dizziness or shortness of breath.  Chest pressure also usually spontaneously in the last for few hours before spontaneously resolving.  They are unchanged with activity no associated nausea, vomiting, sweating or palpitations.  Denies any recent illness or trauma to the chest. Patient report issues with acid reflux and heartburn associated with acidic test in her occasionally.  Notices occasional use of vape which she discontinued on Saturday.  PERTINENT  PMH / PSH: Reviewed  OBJECTIVE:   BP 105/72   Pulse 73   Ht 5\' 3"  (1.6 m)   Wt 217 lb 3.2 oz (98.5 kg)   SpO2 100%   BMI 38.48 kg/m    Physical Exam General: Alert, well appearing, NAD Cardiovascular: RRR, No Murmurs, Normal S2/S2 Respiratory: CTAB, No wheezing or Rales Abdomen: No distension or tenderness Extremities: No edema on extremities   Skin: Warm and dry MSK: Mild tenderness over the sternum with palpation   ASSESSMENT/PLAN:   Chest pain Broad differential for chest pain but more suspicious of GERD given symptoms of heartburn and reflux.  Although not consistent with her history of anxiety or panic attack which includes palpitations  would not rule out possible component of anxiety in patient with known history of panic attacks, anxiety and depression who is currently not on any medication.  Very low concern for ACS in young patient with no risk factor given nature of chest pain and normal EKG. Also considering costochondritis given reproducible tenderness on exam. -Obtain EKG -Rx omeprazole 40 mg daily -Discussed ways to prevent or reduce risk of reflux -Encouraged  discontinuation of vaping -Follow-up in 1 months or earlier with worsening symptoms   Jerre Simon, MD [facility]   {    This will disappear when note is signed, click to select method of visit    :1}

## 2024-01-15 NOTE — Patient Instructions (Signed)
 Pleasure to meet you today.  Your EKG today was normal and no concerns for heart attack.  Suspect your chest pain is most likely due to reflux or GERD.  I have started you on omeprazole 40 mg which you will take once daily.  Also make sure after you eat that you do not lay down for at least 30-40 minutes.  Things to do to reduce reflux include reducing intake of oily food, vaping, or alcohol use.

## 2024-01-15 NOTE — Telephone Encounter (Signed)
 Patient calls nurse line requesting an apt.   She reports she has been experiencing "chest pains" since Saturday. She reports she has a strong hx of anxiety and unsure if she is just feeling "overwhelmed."  She reports they come and go and denies a "crushing" feeling.   She denies any vision changes, dizziness, left arm weakness, nausea or vomiting. She denies any shortness of breath.   Patient scheduled for this afternoon in ATC.   Precautions discussed in the meantime.

## 2024-01-15 NOTE — Assessment & Plan Note (Signed)
 Broad differential for chest pain but more suspicious of GERD given symptoms of heartburn and reflux.  Although not consistent with her history of anxiety or panic attack which includes palpitations  would not rule out possible component of anxiety in patient with known history of panic attacks, anxiety and depression who is currently not on any medication.  Very low concern for ACS in young patient with no risk factor given nature of chest pain and normal EKG. Also considering costochondritis given reproducible tenderness on exam. -Obtain EKG -Rx omeprazole 40 mg daily -Discussed ways to prevent or reduce risk of reflux -Encouraged discontinuation of vaping -Follow-up in 1 months or earlier with worsening symptoms

## 2024-01-16 ENCOUNTER — Encounter: Payer: Self-pay | Admitting: Student

## 2024-01-17 NOTE — Progress Notes (Signed)
  SUBJECTIVE:   CHIEF COMPLAINT / HPI:   STI check - continues to be sexually active with 2 partners, inconsistent condom use - Last sexual encounter: 2/15 - Contraception: none, has tried nexplanon and depo in the past; does not want IUD, is perhaps willing to try nexplanon again Symptoms include: none, no discharge  - requesting full STD panel LMP 12/22/23.  PERTINENT  PMH / PSH: BV, gonorrhea/committee  OBJECTIVE:  BP 107/69   Pulse 85   Wt 217 lb 12.8 oz (98.8 kg)   LMP 12/22/2023   SpO2 100%   BMI 38.58 kg/m  Pelvic exam: VULVA: normal appearing vulva with no masses, tenderness or lesions, VAGINA: vaginal discharge - white and creamy, CERVIX: normal appearing cervix without discharge or lesions.  Chaperone: Drusilla Kanner, CMA  ASSESSMENT/PLAN:   Assessment & Plan Routine screening for STI (sexually transmitted infection) Screen for BV, yeast, trichomonas, gonorrhea, chlamydia, syphilis, HIV, hepatitis B/C.  Strongly urged form of contraception and counseled on various forms.  Advised prenatal multivitamin and consistent condom use.  BV on wet prep, negative pregnancy test.  Flagyl twice daily x 7 days. Encounter for HIV pre-exposure prophylaxis Refill Truvada.  Shelby Mattocks, DO 01/18/2024, 9:45 AM PGY-3, [facility]

## 2024-01-18 ENCOUNTER — Ambulatory Visit (INDEPENDENT_AMBULATORY_CARE_PROVIDER_SITE_OTHER): Payer: Medicaid Other | Admitting: Student

## 2024-01-18 ENCOUNTER — Other Ambulatory Visit (HOSPITAL_COMMUNITY)
Admission: RE | Admit: 2024-01-18 | Discharge: 2024-01-18 | Disposition: A | Discharge status: Home / Self Care | Source: Ambulatory Visit | Attending: Family Medicine | Admitting: Family Medicine

## 2024-01-18 ENCOUNTER — Encounter: Payer: Self-pay | Admitting: Student

## 2024-01-18 VITALS — BP 107/69 | HR 85 | Wt 217.8 lb

## 2024-01-18 DIAGNOSIS — Z113 Encounter for screening for infections with a predominantly sexual mode of transmission: Secondary | ICD-10-CM | POA: Insufficient documentation

## 2024-01-18 DIAGNOSIS — Z2981 Encounter for HIV pre-exposure prophylaxis: Secondary | ICD-10-CM | POA: Diagnosis not present

## 2024-01-18 LAB — POCT WET PREP (WET MOUNT)
Clue Cells Wet Prep Whiff POC: POSITIVE
Trichomonas Wet Prep HPF POC: ABSENT

## 2024-01-18 LAB — POCT URINE PREGNANCY: Preg Test, Ur: NEGATIVE

## 2024-01-18 MED ORDER — METRONIDAZOLE 500 MG PO TABS: 500.0000 mg | ORAL_TABLET | Freq: Two times a day (BID) | ORAL | 0 refills | Status: AC

## 2024-01-18 MED ORDER — EMTRICITABINE-TENOFOVIR DF 200-300 MG PO TABS: 1.0000 | ORAL_TABLET | Freq: Every day | ORAL | 0 refills | Status: DC

## 2024-01-18 NOTE — Assessment & Plan Note (Addendum)
 Screen for BV, yeast, trichomonas, gonorrhea, chlamydia, syphilis, HIV, hepatitis B/C.  Strongly urged form of contraception and counseled on various forms.  Advised prenatal multivitamin and consistent condom use.  BV on wet prep, negative pregnancy test.  Flagyl twice daily x 7 days.

## 2024-01-18 NOTE — Patient Instructions (Addendum)
 It was great to see you today! Thank you for choosing [facility] for your primary care.  Today we addressed: We are testing for STDs today.  I strongly urged you to consistently use condoms or consider a method of contraception.  I would be happy to place Nexplanon if that is method that has worked for you in the past. I have refilled your Truvada.  This should be taken daily to prevent HIV.  If you haven't already, sign up for My Chart to have easy access to your labs results, and communication with your primary care physician. We are checking some labs today. If they are abnormal, I will call you. If they are normal, I will send you a MyChart message (if it is active) or a letter in the mail. If you do not hear about your labs in the next 2 weeks, please call the office.  Please arrive 15 minutes before your appointment to ensure smooth check in process.  We appreciate your efforts in making this happen.  Thank you for allowing me to participate in your care, Christy Mattocks, DO 01/18/2024, 9:40 AM PGY-3, [facility]

## 2024-01-19 DIAGNOSIS — Z419 Encounter for procedure for purposes other than remedying health state, unspecified: Secondary | ICD-10-CM | POA: Diagnosis not present

## 2024-01-19 LAB — RPR: RPR Ser Ql: NONREACTIVE

## 2024-01-19 LAB — HCV AB W REFLEX TO QUANT PCR: HCV Ab: NONREACTIVE

## 2024-01-19 LAB — HIV ANTIBODY (ROUTINE TESTING W REFLEX): HIV Screen 4th Generation wRfx: NONREACTIVE

## 2024-01-19 LAB — HEPATITIS B SURFACE ANTIGEN: Hepatitis B Surface Ag: NEGATIVE

## 2024-01-19 LAB — HCV INTERPRETATION

## 2024-01-20 LAB — CERVICOVAGINAL ANCILLARY ONLY
Chlamydia: NEGATIVE
Comment: NEGATIVE
Comment: NORMAL
Neisseria Gonorrhea: NEGATIVE

## 2024-01-30 DIAGNOSIS — F331 Major depressive disorder, recurrent, moderate: Secondary | ICD-10-CM | POA: Diagnosis not present

## 2024-01-30 DIAGNOSIS — F411 Generalized anxiety disorder: Secondary | ICD-10-CM | POA: Diagnosis not present

## 2024-02-13 DIAGNOSIS — F331 Major depressive disorder, recurrent, moderate: Secondary | ICD-10-CM | POA: Diagnosis not present

## 2024-02-13 DIAGNOSIS — F411 Generalized anxiety disorder: Secondary | ICD-10-CM | POA: Diagnosis not present

## 2024-02-22 DIAGNOSIS — F411 Generalized anxiety disorder: Secondary | ICD-10-CM | POA: Diagnosis not present

## 2024-02-22 DIAGNOSIS — F331 Major depressive disorder, recurrent, moderate: Secondary | ICD-10-CM | POA: Diagnosis not present

## 2024-03-01 DIAGNOSIS — Z419 Encounter for procedure for purposes other than remedying health state, unspecified: Secondary | ICD-10-CM | POA: Diagnosis not present

## 2024-03-14 DIAGNOSIS — F331 Major depressive disorder, recurrent, moderate: Secondary | ICD-10-CM | POA: Diagnosis not present

## 2024-03-14 DIAGNOSIS — F411 Generalized anxiety disorder: Secondary | ICD-10-CM | POA: Diagnosis not present

## 2024-03-25 ENCOUNTER — Encounter: Payer: Self-pay | Admitting: Student

## 2024-03-25 ENCOUNTER — Ambulatory Visit: Admitting: Student

## 2024-03-25 VITALS — BP 115/80 | HR 74 | Ht 63.0 in | Wt 213.8 lb

## 2024-03-25 DIAGNOSIS — M79671 Pain in right foot: Secondary | ICD-10-CM

## 2024-03-25 DIAGNOSIS — Z113 Encounter for screening for infections with a predominantly sexual mode of transmission: Secondary | ICD-10-CM

## 2024-03-25 MED ORDER — DICLOFENAC SODIUM 1 % EX GEL: 4.0000 g | Freq: Four times a day (QID) | CUTANEOUS | 0 refills | Status: AC

## 2024-03-25 MED ORDER — NAPROXEN 500 MG PO TABS: 500.0000 mg | ORAL_TABLET | Freq: Two times a day (BID) | ORAL | 0 refills | Status: DC

## 2024-03-25 NOTE — Patient Instructions (Signed)
 It was great to see you! Thank you for allowing me to participate in your care!   Our plans for today:  - I am ordering whole ankle x-ray to evaluate for any fractures - I am also sending in naproxen for you to take for pain control  Take care and seek immediate care sooner if you develop any concerns.  Genora Kidd, MD

## 2024-03-25 NOTE — Progress Notes (Signed)
    SUBJECTIVE:   CHIEF COMPLAINT / HPI:   Discussed the use of AI scribe software for clinical note transcription with the patient, who gave verbal consent to proceed.  History of Present Illness Christy Barnes "Leslieann Gaydosh" is a 28 year old female who presents with right foot pain since February.  The right foot pain began in late February after an incorrect landing at the gym. Initially improving, the pain has worsened over the past two weeks. It is located on the lateral aspect of the foot, extending to the heel, and is described as a 'stingy feeling'. The pain is present without weight-bearing and worsens with walking or standing. Transitioning to hot yoga, she experiences difficulty with certain poses due to the pain.  She has not had any imaging studies. Pain medications are ineffective. She uses peppermint oil and oregano for relief and occasionally soaks her foot. There is no recent twisting of the ankle, but she has a history of frequent ankle twisting since childhood. She was able to walk immediately after the initial injury, albeit with a limp.  There is no pain in the ankle itself, but tenderness is present on the lateral and medial aspects of the foot. Pain occurs with both upward and downward movement of the foot, and discomfort is noted when pushing in or out against resistance. No pain is reported in the back of the foot, only slight irritation.   PERTINENT  PMH / PSH: None relevant  OBJECTIVE:   BP 115/80   Pulse 74   Ht 5\' 3"  (1.6 m)   Wt 213 lb 12.8 oz (97 kg)   LMP 03/14/2024   SpO2 100%   BMI 37.87 kg/m    General: NAD, pleasant, able to participate in exam Respiratory: Normal effort, no obvious respiratory distress  Foot: Inspection:  No obvious bony deformity b/l.  Tenderness to palpation of base of fifth metatarsal. No swelling, erythema, or bruising b/l.  Normal arch b/l Palpation: No tenderness to palpation b/l ROM: Full  ROM of the ankle b/l  although painful. Normal midfoot flexibility b/l Strength: 5/5 strength ankle in all planes b/l with pain on right Neurovascular: N/V intact distally in the lower extremity b/l Special tests: Negative anterior drawer. Negative squeeze. normal midfoot flexibility. Normal calcaneal motion with heel raise  Chaperone Elonda Hale CMA present for pelvic exam  ASSESSMENT/PLAN:   Assessment & Plan Right foot pain Pain lateral and medial foot, ddx includes bunion vs stress fracture at base of fifth metatarsal. - Order right foot X-ray and ankle X-ray  - Prescribe voltaren gel per pt request Screening for STD (sexually transmitted disease) Requests testing, will come back for swabs - HIV antibody (with reflex) - RPR - Hepatitis C antibody (reflex, frozen specimen)    Genora Kidd, MD [facility]

## 2024-03-26 ENCOUNTER — Encounter: Payer: Self-pay | Admitting: Student

## 2024-03-26 DIAGNOSIS — F331 Major depressive disorder, recurrent, moderate: Secondary | ICD-10-CM | POA: Diagnosis not present

## 2024-03-26 DIAGNOSIS — F411 Generalized anxiety disorder: Secondary | ICD-10-CM | POA: Diagnosis not present

## 2024-03-26 LAB — RPR: RPR Ser Ql: NONREACTIVE

## 2024-03-26 LAB — HIV ANTIBODY (ROUTINE TESTING W REFLEX): HIV Screen 4th Generation wRfx: NONREACTIVE

## 2024-03-26 LAB — HCV INTERPRETATION

## 2024-03-26 LAB — HCV AB W REFLEX TO QUANT PCR: HCV Ab: NONREACTIVE

## 2024-03-27 ENCOUNTER — Other Ambulatory Visit (HOSPITAL_COMMUNITY)
Admission: RE | Admit: 2024-03-27 | Discharge: 2024-03-27 | Disposition: A | Discharge status: Home / Self Care | Source: Ambulatory Visit | Attending: Family Medicine | Admitting: Family Medicine

## 2024-03-27 ENCOUNTER — Ambulatory Visit
Admission: RE | Admit: 2024-03-27 | Discharge: 2024-03-27 | Disposition: A | Discharge status: Home / Self Care | Source: Ambulatory Visit | Attending: Family Medicine | Admitting: Family Medicine

## 2024-03-27 ENCOUNTER — Ambulatory Visit

## 2024-03-27 VITALS — BP 111/76 | HR 76 | Ht 63.0 in | Wt 211.4 lb

## 2024-03-27 DIAGNOSIS — B9689 Other specified bacterial agents as the cause of diseases classified elsewhere: Secondary | ICD-10-CM

## 2024-03-27 DIAGNOSIS — Z113 Encounter for screening for infections with a predominantly sexual mode of transmission: Secondary | ICD-10-CM | POA: Diagnosis not present

## 2024-03-27 DIAGNOSIS — M79671 Pain in right foot: Secondary | ICD-10-CM

## 2024-03-27 DIAGNOSIS — M7989 Other specified soft tissue disorders: Secondary | ICD-10-CM | POA: Diagnosis not present

## 2024-03-27 DIAGNOSIS — N760 Acute vaginitis: Secondary | ICD-10-CM | POA: Diagnosis not present

## 2024-03-27 LAB — POCT WET PREP (WET MOUNT)
Clue Cells Wet Prep Whiff POC: POSITIVE
Trichomonas Wet Prep HPF POC: ABSENT
WBC, Wet Prep HPF POC: NONE SEEN

## 2024-03-27 LAB — POCT URINE PREGNANCY: Preg Test, Ur: NEGATIVE

## 2024-03-27 MED ORDER — METRONIDAZOLE 500 MG PO TABS: 500.0000 mg | ORAL_TABLET | Freq: Two times a day (BID) | ORAL | 0 refills | Status: DC

## 2024-03-27 MED ORDER — FLUCONAZOLE 150 MG PO TABS: 150.0000 mg | ORAL_TABLET | Freq: Once | ORAL | 0 refills | Status: AC

## 2024-03-27 NOTE — Patient Instructions (Signed)
 I will let you know if results today are abnormal

## 2024-03-27 NOTE — Progress Notes (Signed)
    SUBJECTIVE:   CHIEF COMPLAINT / HPI:   STI testing Overall asymptomatic - no itching, burning, dysuria, discharge, fevers Occasionally notices some odor Does not use contraception - not interested at this time OK with pregnancy test  Burnt her lip recently and has a small lesion - wants testing for herpes  Does have oral sex, wants oral g/c swab as well   PERTINENT  PMH / PSH: hx BV  OBJECTIVE:   BP 111/76   Pulse 76   Ht 5\' 3"  (1.6 m)   Wt 211 lb 6.4 oz (95.9 kg)   LMP 03/14/2024   SpO2 100%   BMI 37.45 kg/m   General: NAD, pleasant, able to participate in exam Respiratory: No respiratory distress Skin: bottom lip with skin breakdown but no significant pustular lesion, minimal erythema, no drainage or fluid collection Psych: Normal affect and mood  Pelvic exam: VULVA: normal appearing vulva with no masses, tenderness or lesions, VAGINA: normal appearing vagina with normal color and discharge, no lesions, CERVIX: cervical discharge present - white and scant, exam chaperoned by Trula Gable CMA.  ASSESSMENT/PLAN:   Assessment & Plan Screening for STD (sexually transmitted disease) Asymptomatic aside from minimal odor Wet mount shows BV, no yeast or trich. Treat as below Urine pregnancy neg Vaginal and oral swabs for GC collected Attempted swab of oral lesion for HSV but doubtful given appearance  BV (bacterial vaginosis) Flagyl  BID x 7 days Diflucan  for yeast infection if needed   Edison Gore, MD [facility]

## 2024-03-28 LAB — CERVICOVAGINAL ANCILLARY ONLY
Chlamydia: NEGATIVE
Chlamydia: NEGATIVE
Comment: NEGATIVE
Comment: NEGATIVE
Comment: NORMAL
Comment: NEGATIVE
Comment: NORMAL
Neisseria Gonorrhea: NEGATIVE
Neisseria Gonorrhea: NEGATIVE
Trichomonas: NEGATIVE

## 2024-03-31 ENCOUNTER — Encounter: Payer: Self-pay | Admitting: Student

## 2024-03-31 ENCOUNTER — Encounter: Payer: Self-pay | Admitting: Family Medicine

## 2024-03-31 DIAGNOSIS — Z419 Encounter for procedure for purposes other than remedying health state, unspecified: Secondary | ICD-10-CM | POA: Diagnosis not present

## 2024-03-31 LAB — HERPES SIMPLEX VIRUS CULTURE

## 2024-04-02 DIAGNOSIS — F331 Major depressive disorder, recurrent, moderate: Secondary | ICD-10-CM | POA: Diagnosis not present

## 2024-04-02 DIAGNOSIS — F411 Generalized anxiety disorder: Secondary | ICD-10-CM | POA: Diagnosis not present

## 2024-04-09 DIAGNOSIS — F331 Major depressive disorder, recurrent, moderate: Secondary | ICD-10-CM | POA: Diagnosis not present

## 2024-04-09 DIAGNOSIS — F411 Generalized anxiety disorder: Secondary | ICD-10-CM | POA: Diagnosis not present

## 2024-04-16 DIAGNOSIS — F411 Generalized anxiety disorder: Secondary | ICD-10-CM | POA: Diagnosis not present

## 2024-04-16 DIAGNOSIS — F331 Major depressive disorder, recurrent, moderate: Secondary | ICD-10-CM | POA: Diagnosis not present

## 2024-04-23 DIAGNOSIS — F331 Major depressive disorder, recurrent, moderate: Secondary | ICD-10-CM | POA: Diagnosis not present

## 2024-04-23 DIAGNOSIS — F411 Generalized anxiety disorder: Secondary | ICD-10-CM | POA: Diagnosis not present

## 2024-04-24 ENCOUNTER — Encounter: Payer: Self-pay | Admitting: Family Medicine

## 2024-04-24 ENCOUNTER — Other Ambulatory Visit (HOSPITAL_COMMUNITY)
Admission: RE | Admit: 2024-04-24 | Discharge: 2024-04-24 | Disposition: A | Discharge status: Home / Self Care | Source: Ambulatory Visit | Attending: Family Medicine | Admitting: Family Medicine

## 2024-04-24 ENCOUNTER — Ambulatory Visit (INDEPENDENT_AMBULATORY_CARE_PROVIDER_SITE_OTHER): Admitting: Family Medicine

## 2024-04-24 ENCOUNTER — Ambulatory Visit

## 2024-04-24 VITALS — BP 112/62 | HR 80 | Wt 212.0 lb

## 2024-04-24 DIAGNOSIS — N898 Other specified noninflammatory disorders of vagina: Secondary | ICD-10-CM | POA: Insufficient documentation

## 2024-04-24 DIAGNOSIS — Z113 Encounter for screening for infections with a predominantly sexual mode of transmission: Secondary | ICD-10-CM

## 2024-04-24 LAB — POCT WET PREP (WET MOUNT)
Clue Cells Wet Prep Whiff POC: POSITIVE
Trichomonas Wet Prep HPF POC: ABSENT

## 2024-04-24 NOTE — Patient Instructions (Addendum)
 Dear Christy Barnes  Today we discussed the following concerns and plans:  STI screening - I will let you know the results of your test.  If you have any concerns, please call the clinic or schedule an appointment.  It was a pleasure to take care of you today. Be well!  Omar Bibber, DO [facility], PGY-1

## 2024-04-24 NOTE — Progress Notes (Signed)
    SUBJECTIVE:   CHIEF COMPLAINT / HPI:   STI screening Has noticed increased discharge in the last few days, like cottage cheese.  No itching, pain, dysuria.  No odor. Not currently on [facility].  PERTINENT  PMH / PSH: Reviewed.  OBJECTIVE:   BP 112/62   Pulse 80   Wt 212 lb (96.2 kg)   LMP 04/11/2024   SpO2 98%   BMI 37.55 kg/m    General: Well-appearing, no acute distress. Pulm: No increased work of breathing. Extremities: no peripheral edema. Moves all extremities equally. Neuro: Alert and oriented x3, speech normal in content, no facial asymmetry. Psych:  Cognition and judgment appear intact. Alert, communicative, and cooperative.  GU Exam:  External exam: Normal-appearing female external genitalia.   Vaginal exam notable for normal vaginal rugae.  Cervix without discharge or obvious lesion. Chaperoned exam, CMA Paige.    ASSESSMENT/PLAN:   Assessment & Plan Screening for STDs (sexually transmitted diseases) Has had increased discharge recently. Desires STI screening. Discussed BC options, pt politely declines at this time. - STI screen today, will discuss results with patient when available.    Omar Bibber, DO [facility]

## 2024-04-25 ENCOUNTER — Ambulatory Visit: Admitting: Family Medicine

## 2024-04-25 ENCOUNTER — Ambulatory Visit: Payer: Self-pay | Admitting: Family Medicine

## 2024-04-25 ENCOUNTER — Other Ambulatory Visit: Payer: Self-pay | Admitting: Family Medicine

## 2024-04-25 VITALS — BP 110/70 | Ht 63.0 in | Wt 213.8 lb

## 2024-04-25 DIAGNOSIS — M25571 Pain in right ankle and joints of right foot: Secondary | ICD-10-CM | POA: Diagnosis not present

## 2024-04-25 LAB — CERVICOVAGINAL ANCILLARY ONLY
Chlamydia: NEGATIVE
Comment: NEGATIVE
Comment: NEGATIVE
Comment: NORMAL
Neisseria Gonorrhea: NEGATIVE
Trichomonas: NEGATIVE

## 2024-04-25 MED ORDER — FLUCONAZOLE 150 MG PO TABS: 150.0000 mg | ORAL_TABLET | Freq: Once | ORAL | 0 refills | Status: AC

## 2024-04-25 NOTE — Patient Instructions (Signed)
 I think you have strained your plantar fascia on the right foot.  We have placed you on some temporary insoles and given you some exercises, stretching and information about icing.  I would like to see you back in 3 weeks or so.  At that time I would hope that you are 50 to 75% better.  Great to see you.

## 2024-04-25 NOTE — Progress Notes (Signed)
 PCP: Genora Kidd, MD  Subjective:   HPI: Patient is a 28 y.o. female here for right foot pain.  Enas states that in April she was working out when she landed on her foot/ankle wrong.  Afterwards she did not experience any acute pain, but the day after she began experiencing pain along the medial and lateral aspect of there right foot. She describes the pain as a sharp pinch or ache, made worse by walking or use of heels. She states she also awakes in the morning with pain. She has been elevating, massaging, and icing her foot for relief. She states that she does have a history of several ankle sprains as a child. She does not have any numbness, tingling, burning of her foot. She had x-rays of her feet on 03/30/2024 that were not significant for any bony or intra-articular abnormalities.   Past Medical History:  Diagnosis Date   Labial tear freshman year   Overweight and obesity(278.0)    Sickle cell trait (HCC) 06/07/2017         Allergies  Allergen Reactions   Hydrocortisone Nausea Only   Fish Allergy    Morphine  And Codeine  Itching   Peanut-Containing Drug Products    Shellfish Allergy Other (See Comments)    BP 110/70   Ht 5\' 3"  (1.6 m)   Wt 213 lb 12.8 oz (97 kg)   LMP 04/11/2024   BMI 37.87 kg/m         Objective:  Physical Exam:  Gen: NAD, comfortable in exam room  MSK:  Right Ankle Inspection: No bruising, swelling. Palpation: No tenderness along medial and lateral malleolus, calcaneus, fibular head, achilles.  ROM: Dorsiflexion 20 deg, plantar flexion 50 deg, inversion 30 deg, eversion 10 deg Strength: plantarflexion 5/5, dorsiflexion  5/5, inversion  5/5, eversion  5/5 Gait:  Special Tests: talar tilt negative, anterior drawer negative, calcaneal squeeze negative  Foot Inspection: No bruises, swelling. Pes planus bilaterally, no rotation of lateral column nor valgus/varus deformity.  Palpation: Tender to longitudinal arch, attachment of plantar  fascia. No tenderness along metatarsals. MTP joints.  Strength: Digit flexion 5/5, Hallux flexion 5/5 Special Tests: reproduction of pain with full dorsiflexion    Assessment & Plan:   1. Plantar Fascia Strain on RIGHT foot - Orthotics to support arch  - Exercises and stretches for arch and ankle stability - Continue ice, massages, ibuprofen  as needed for pain - Will f/u in 3 weeks to reassess   Elnoria Hails, [facility]

## 2024-04-25 NOTE — Progress Notes (Signed)
 [facility] Attending Note: I have seen and examined this patient with the medical student. I have  reviewed the history, physical examination, assessment and plan as documented in the medical student's note.  I agree with the medical student's note and findings, assessment and treatment plan as documented with the following additions : Subacute right foot pain I think is related to plantar fascia strain.  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  Agree with conservative management, arch supports, exercise, icing.  Return to clinic for follow-up in about 3 weeks.  If not improving we could consider multiple other modalities including nitroglycerin patch therapy, custom molded orthotics, potentially even corticosteroid injection therapy although that would be far down my list.  We did not perform extremity ultrasound as I see I see no indication of possible rupture today based on her history and exam

## 2024-04-26 LAB — HCV AB W REFLEX TO QUANT PCR: HCV Ab: NONREACTIVE

## 2024-04-26 LAB — HCV INTERPRETATION

## 2024-04-26 LAB — HIV ANTIBODY (ROUTINE TESTING W REFLEX): HIV Screen 4th Generation wRfx: NONREACTIVE

## 2024-04-26 LAB — RPR: RPR Ser Ql: NONREACTIVE

## 2024-04-29 ENCOUNTER — Encounter: Payer: Self-pay | Admitting: *Deleted

## 2024-04-29 ENCOUNTER — Ambulatory Visit (INDEPENDENT_AMBULATORY_CARE_PROVIDER_SITE_OTHER): Admitting: Obstetrics and Gynecology

## 2024-04-29 ENCOUNTER — Encounter: Payer: Self-pay | Admitting: Obstetrics and Gynecology

## 2024-04-29 VITALS — BP 104/66 | HR 81 | Temp 98.8°F | Ht 63.75 in | Wt 236.0 lb

## 2024-04-29 DIAGNOSIS — R102 Pelvic and perineal pain: Secondary | ICD-10-CM

## 2024-04-29 DIAGNOSIS — M5440 Lumbago with sciatica, unspecified side: Secondary | ICD-10-CM

## 2024-04-29 DIAGNOSIS — N760 Acute vaginitis: Secondary | ICD-10-CM | POA: Diagnosis not present

## 2024-04-29 DIAGNOSIS — B9689 Other specified bacterial agents as the cause of diseases classified elsewhere: Secondary | ICD-10-CM | POA: Diagnosis not present

## 2024-04-29 LAB — URINALYSIS, COMPLETE W/RFL CULTURE
Bacteria, UA: NONE SEEN /HPF
Bilirubin Urine: NEGATIVE
Casts: NONE SEEN /LPF
Crystals: NONE SEEN /HPF
Glucose, UA: NEGATIVE
Hgb urine dipstick: NEGATIVE
Ketones, ur: NEGATIVE
Leukocyte Esterase: NEGATIVE
Nitrites, Initial: NEGATIVE
Protein, ur: NEGATIVE
RBC / HPF: NONE SEEN /HPF (ref 0–2)
Specific Gravity, Urine: 1.015 (ref 1.001–1.035)
WBC, UA: NONE SEEN /HPF (ref 0–5)
Yeast: NONE SEEN /HPF
pH: 7 (ref 5.0–8.0)

## 2024-04-29 LAB — PREGNANCY, URINE: Preg Test, Ur: NEGATIVE

## 2024-04-29 LAB — NO CULTURE INDICATED

## 2024-04-30 DIAGNOSIS — F331 Major depressive disorder, recurrent, moderate: Secondary | ICD-10-CM | POA: Diagnosis not present

## 2024-04-30 DIAGNOSIS — F411 Generalized anxiety disorder: Secondary | ICD-10-CM | POA: Diagnosis not present

## 2024-04-30 LAB — SURESWAB® ADVANCED VAGINITIS PLUS,TMA
C. trachomatis RNA, TMA: NOT DETECTED
CANDIDA SPECIES: NOT DETECTED
Candida glabrata: NOT DETECTED
N. gonorrhoeae RNA, TMA: NOT DETECTED
SURESWAB(R) ADV BACTERIAL VAGINOSIS(BV),TMA: POSITIVE — AB
TRICHOMONAS VAGINALIS (TV),TMA: NOT DETECTED
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
## 2024-05-01 ENCOUNTER — Ambulatory Visit: Payer: Self-pay | Admitting: Obstetrics and Gynecology

## 2024-05-01 DIAGNOSIS — R102 Pelvic and perineal pain: Secondary | ICD-10-CM

## 2024-05-01 DIAGNOSIS — Z419 Encounter for procedure for purposes other than remedying health state, unspecified: Secondary | ICD-10-CM | POA: Diagnosis not present

## 2024-05-01 MED ORDER — METRONIDAZOLE 500 MG PO TABS: 500.0000 mg | ORAL_TABLET | Freq: Two times a day (BID) | ORAL | 0 refills | Status: AC

## 2024-05-06 ENCOUNTER — Ambulatory Visit (INDEPENDENT_AMBULATORY_CARE_PROVIDER_SITE_OTHER)

## 2024-05-06 DIAGNOSIS — R102 Pelvic and perineal pain: Secondary | ICD-10-CM | POA: Diagnosis not present

## 2024-05-06 LAB — MYCOPLASMA / UREAPLASMA CULTURE

## 2024-05-07 MED ORDER — DOXYCYCLINE HYCLATE 100 MG PO TABS: 100.0000 mg | ORAL_TABLET | Freq: Two times a day (BID) | ORAL | 0 refills | Status: AC

## 2024-05-08 DIAGNOSIS — F331 Major depressive disorder, recurrent, moderate: Secondary | ICD-10-CM | POA: Diagnosis not present

## 2024-05-08 DIAGNOSIS — F411 Generalized anxiety disorder: Secondary | ICD-10-CM | POA: Diagnosis not present

## 2024-05-14 DIAGNOSIS — F411 Generalized anxiety disorder: Secondary | ICD-10-CM | POA: Diagnosis not present

## 2024-05-14 DIAGNOSIS — F331 Major depressive disorder, recurrent, moderate: Secondary | ICD-10-CM | POA: Diagnosis not present

## 2024-05-16 ENCOUNTER — Encounter: Payer: Self-pay | Admitting: Family Medicine

## 2024-05-16 ENCOUNTER — Ambulatory Visit (INDEPENDENT_AMBULATORY_CARE_PROVIDER_SITE_OTHER): Admitting: Family Medicine

## 2024-05-16 VITALS — BP 110/80 | Ht 63.75 in | Wt 213.0 lb

## 2024-05-16 DIAGNOSIS — M25571 Pain in right ankle and joints of right foot: Secondary | ICD-10-CM

## 2024-05-16 NOTE — Patient Instructions (Signed)
 Your x-rays were reassuring. You have a lot of compensatory issues in addition to your initial injury (post tib tendon, peroneal tendons, plantar fascia). The arch supports are very important - use these in shoes every time you're up and walking around. Avoid flat shoes, barefoot walking. Start physical therapy and do home exercises on days you don't go to therapy. Aleve  1-2 tabs twice a day with food for pain and inflammation. Tylenol  as needed. Icing 15 minutes at a time as needed. Follow up with us  in 1 month for reevaluation. Consider MRI, ultrasound if not improving as expected.

## 2024-05-16 NOTE — Progress Notes (Signed)
 PCP: Christy Budds, MD  Subjective:   HPI: Patient is a 28 y.o. female here for right foot pain.  6/6: Christy Barnes states that in April she was working out when she landed on her foot/ankle wrong.  Afterwards she did not experience any acute pain, but the day after she began experiencing pain along the medial and lateral aspect of there right foot. She describes the pain as a sharp pinch or ache, made worse by walking or use of heels. She states she also awakes in the morning with pain. She has been elevating, massaging, and icing her foot for relief. She states that she does have a history of several ankle sprains as a child. She does not have any numbness, tingling, burning of her foot. She had x-rays of her feet on 03/30/2024 that were not significant for any bony or intra-articular abnormalities.   6/27: Patient reports she feels about the same compared to last visit. Has worn the inserts some and they help. Doing home exercises. Pain had been lateral and is now plantar as well. No new injuries. Feels like it's more swollen at times.  Past Medical History:  Diagnosis Date   Labial tear freshman year   Overweight and obesity(278.0)    Sickle cell trait (HCC) 06/07/2017    Current Outpatient Medications on File Prior to Visit  Medication Sig Dispense Refill   cetirizine  (ZYRTEC ) 10 MG tablet Take 1 tablet (10 mg total) by mouth daily. (Patient not taking: Reported on 04/29/2024) 30 tablet 11   diclofenac  Sodium (VOLTAREN  ARTHRITIS PAIN) 1 % GEL Apply 4 g topically 4 (four) times daily. (Patient not taking: Reported on 04/29/2024) 100 g 0   doxycycline  (VIBRA -TABS) 100 MG tablet Take 1 tablet (100 mg total) by mouth 2 (two) times daily for 10 days. 20 tablet 0   emtricitabine -tenofovir  (TRUVADA) 200-300 MG tablet Take 1 tablet by mouth daily. (Patient not taking: Reported on 04/29/2024) 90 tablet 0   EPINEPHrine  0.3 mg/0.3 mL IJ SOAJ injection Inject 0.3 mLs (0.3 mg total) into the skin as  needed (Anaphylaxis). 1 Device 1   fluticasone  (FLONASE ) 50 MCG/ACT nasal spray Place 2 sprays into both nostrils daily. (Patient not taking: Reported on 04/29/2024) 48 g 1   folic acid  (FOLVITE ) 1 MG tablet Take 1 tablet (1 mg total) by mouth daily. (Patient not taking: Reported on 04/29/2024) 90 tablet 3   metroNIDAZOLE  (FLAGYL ) 500 MG tablet Take 1 tablet (500 mg total) by mouth 2 (two) times daily. 14 tablet 0   omeprazole  (PRILOSEC) 40 MG capsule Take 1 capsule (40 mg total) by mouth daily. (Patient not taking: Reported on 04/29/2024) 30 capsule 3   SUMAtriptan  (IMITREX ) 50 MG tablet Take 1 tablet (50 mg total) by mouth every 2 (two) hours as needed for migraine. May repeat in 2 hours if headache persists or recurs.  Take first dose as soon as possible after headache starts. (Patient not taking: Reported on 04/29/2024) 12 tablet 0   VITAMIN D  PO Take by mouth.     Vitamin D , Ergocalciferol , (DRISDOL ) 1.25 MG (50000 UNIT) CAPS capsule Take 1 capsule (50,000 Units total) by mouth every 7 (seven) days. (Patient not taking: Reported on 04/29/2024) 5 capsule 0   No current facility-administered medications on file prior to visit.    Past Surgical History:  Procedure Laterality Date   labia repair  freshman year    Allergies  Allergen Reactions   Hydrocortisone Nausea Only   Fish Allergy    Morphine   And Codeine  Itching   Peanut-Containing Drug Products    Shellfish Allergy Other (See Comments)    BP 110/80   Ht 5' 3.75 (1.619 m)   Wt 213 lb (96.6 kg)   LMP 04/11/2024 (Exact Date)   BMI 36.85 kg/m       No data to display              No data to display              Objective:  Physical Exam:  Gen: NAD, comfortable in exam room  Right foot/ankle: No gross deformity, swelling, ecchymoses Full range of motion and strength Mild tenderness to palpation plantar fascia, 5th metatarsal, calcaneus, over post tib tendon Negative ant drawer and negative talar tilt.   NV  intact distally.   Assessment & Plan:  1. Right foot pain - pain not in a specific anatomic location. May have sprained and now having compensatory pain in other areas of foot.  Independently reviewed radiographs and no abnormalities in areas of her pain.  Encouraged to wear inserts regularly.  Start physical therapy and do home exercises on days she doesn't go to therapy.  Tylenol , aleve , icing.  Follow up in 1 month.

## 2024-05-20 DIAGNOSIS — F411 Generalized anxiety disorder: Secondary | ICD-10-CM | POA: Diagnosis not present

## 2024-05-20 DIAGNOSIS — F331 Major depressive disorder, recurrent, moderate: Secondary | ICD-10-CM | POA: Diagnosis not present

## 2024-05-28 ENCOUNTER — Other Ambulatory Visit: Admitting: Obstetrics and Gynecology

## 2024-05-28 DIAGNOSIS — F331 Major depressive disorder, recurrent, moderate: Secondary | ICD-10-CM | POA: Diagnosis not present

## 2024-05-28 DIAGNOSIS — F411 Generalized anxiety disorder: Secondary | ICD-10-CM | POA: Diagnosis not present

## 2024-05-31 DIAGNOSIS — Z419 Encounter for procedure for purposes other than remedying health state, unspecified: Secondary | ICD-10-CM | POA: Diagnosis not present

## 2024-06-05 DIAGNOSIS — F331 Major depressive disorder, recurrent, moderate: Secondary | ICD-10-CM | POA: Diagnosis not present

## 2024-06-05 DIAGNOSIS — F411 Generalized anxiety disorder: Secondary | ICD-10-CM | POA: Diagnosis not present

## 2024-06-06 ENCOUNTER — Encounter: Payer: Self-pay | Admitting: Obstetrics and Gynecology

## 2024-06-06 ENCOUNTER — Ambulatory Visit (INDEPENDENT_AMBULATORY_CARE_PROVIDER_SITE_OTHER): Admitting: Obstetrics and Gynecology

## 2024-06-06 VITALS — BP 114/82

## 2024-06-06 DIAGNOSIS — A64 Unspecified sexually transmitted disease: Secondary | ICD-10-CM | POA: Diagnosis not present

## 2024-06-06 DIAGNOSIS — A493 Mycoplasma infection, unspecified site: Secondary | ICD-10-CM

## 2024-06-06 DIAGNOSIS — N761 Subacute and chronic vaginitis: Secondary | ICD-10-CM

## 2024-06-06 DIAGNOSIS — Z7251 High risk heterosexual behavior: Secondary | ICD-10-CM | POA: Diagnosis not present

## 2024-06-06 DIAGNOSIS — B999 Unspecified infectious disease: Secondary | ICD-10-CM

## 2024-06-06 NOTE — Progress Notes (Signed)
   Acute Office Visit  Subjective:    Patient ID: Christy Barnes, female    DOB: 06/02/1996, 28 y.o.   MRN: [id number]   HPI 28 y.o. presents today for Office Visit (US  consult & TOC Mycoplasma) . Patient completed course for mycoplasma and told her prior partner of need to treat.  She is reports she is not going to return to him. She would like the STI panel completed today  Patient's last menstrual period was 06/05/2024 (exact date).    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 Gardesil completed.  Counseled on safe sexual practices.  Christy Barnes

## 2024-06-07 LAB — SURESWAB® ADVANCED VAGINITIS PLUS,TMA
C. trachomatis RNA, TMA: NOT DETECTED
CANDIDA SPECIES: NOT DETECTED
Candida glabrata: NOT DETECTED
N. gonorrhoeae RNA, TMA: NOT DETECTED
SURESWAB(R) ADV BACTERIAL VAGINOSIS(BV),TMA: NEGATIVE
TRICHOMONAS VAGINALIS (TV),TMA: NOT DETECTED

## 2024-06-08 LAB — HEPATITIS B SURFACE ANTIGEN: Hepatitis B Surface Ag: NONREACTIVE

## 2024-06-08 LAB — HIV ANTIBODY (ROUTINE TESTING W REFLEX): HIV 1&2 Ab, 4th Generation: NONREACTIVE

## 2024-06-08 LAB — RPR: RPR Ser Ql: NONREACTIVE

## 2024-06-08 LAB — HEPATITIS C ANTIBODY: Hepatitis C Ab: NONREACTIVE

## 2024-06-09 ENCOUNTER — Ambulatory Visit: Payer: Self-pay | Admitting: Obstetrics and Gynecology

## 2024-06-12 DIAGNOSIS — F331 Major depressive disorder, recurrent, moderate: Secondary | ICD-10-CM | POA: Diagnosis not present

## 2024-06-12 DIAGNOSIS — F411 Generalized anxiety disorder: Secondary | ICD-10-CM | POA: Diagnosis not present

## 2024-06-12 LAB — SURESWAB MYCOPLASMA/UREAPLASMA, PCR
M. hominis DNA: NOT DETECTED
MYCOPLASMA GENITALIUM, rRNA,TMA: NOT DETECTED
U. parvum DNA: NOT DETECTED
U. urealyticum DNA: DETECTED — AB

## 2024-06-13 MED ORDER — DOXYCYCLINE HYCLATE 100 MG PO TABS: 100.0000 mg | ORAL_TABLET | Freq: Two times a day (BID) | ORAL | 0 refills | Status: AC

## 2024-06-26 ENCOUNTER — Telehealth: Payer: Self-pay

## 2024-06-26 DIAGNOSIS — F411 Generalized anxiety disorder: Secondary | ICD-10-CM | POA: Diagnosis not present

## 2024-06-26 DIAGNOSIS — F331 Major depressive disorder, recurrent, moderate: Secondary | ICD-10-CM | POA: Diagnosis not present

## 2024-06-26 NOTE — Telephone Encounter (Signed)
 Called patient in regards to apt request via mychart.   She reports on Sunday she was out shopping and started to experience dizziness. She reports she went out to her car and did some deep breathing exercises and reports she felt better.   She reports she has a strong hx of anxiety and reports she was starting a new job on Monday.   She reports her anxiety has increased over the last couple of days. She did start her new job as scheduled.   She reports she feels she having chest pains. She denies any shortness of breath, nausea, jaw pain or left sided weakness.She reports racing thoughts and fast heartbeat.  She reports she has an apt today with her counselor and reports she usually feels better after this.   Patient scheduled for tomorrow in ATC.  Strict ED precautions given in the meantime.

## 2024-06-27 ENCOUNTER — Ambulatory Visit (HOSPITAL_COMMUNITY)
Admission: RE | Admit: 2024-06-27 | Discharge: 2024-06-27 | Disposition: A | Discharge status: Home / Self Care | Source: Ambulatory Visit | Attending: Family Medicine | Admitting: Family Medicine

## 2024-06-27 ENCOUNTER — Ambulatory Visit (HOSPITAL_COMMUNITY)

## 2024-06-27 ENCOUNTER — Ambulatory Visit (INDEPENDENT_AMBULATORY_CARE_PROVIDER_SITE_OTHER): Admitting: Family Medicine

## 2024-06-27 VITALS — BP 106/70 | HR 69 | Ht 63.0 in | Wt 218.8 lb

## 2024-06-27 DIAGNOSIS — R079 Chest pain, unspecified: Secondary | ICD-10-CM | POA: Diagnosis not present

## 2024-06-27 DIAGNOSIS — F419 Anxiety disorder, unspecified: Secondary | ICD-10-CM

## 2024-06-27 DIAGNOSIS — R002 Palpitations: Secondary | ICD-10-CM | POA: Diagnosis not present

## 2024-06-27 DIAGNOSIS — R42 Dizziness and giddiness: Secondary | ICD-10-CM

## 2024-06-27 MED ORDER — HYDROXYZINE HCL 25 MG PO TABS: 25.0000 mg | ORAL_TABLET | Freq: Three times a day (TID) | ORAL | 0 refills | Status: AC | PRN

## 2024-06-27 NOTE — Patient Instructions (Signed)
 You can use Atarax  as needed for anxiety/panic attacks Please let us  know if your symptoms get worse Follow-up in a month or 2 if your symptoms are not improving and we can consider other medications or referrals.  Continue therapy in the meantime and good luck with your new job

## 2024-06-27 NOTE — Assessment & Plan Note (Addendum)
 Suspect secondary to anxiety GAD-7 consistent with mild anxiety Orthostatic vital signs are negative EKG unremarkable, no evidence of acute arrhythmia or ischemia-initial EKG was actually abnormal and showed T wave inversions in anterolateral leads.  However, this was presumed to be lead inversion and upon repeat her EKG was normal (placed picture of repeat ecg in media tab) Will check CBC, BMP, TSH to evaluate for anemia, electrolyte abnormalities, or thyroid  abnormalities  Per patient preference will start Atarax  as needed for anxiety/panic attack episodes Follow-up in 1 to 2 months as needed especially if symptoms are not improving Discussed return precautions

## 2024-06-27 NOTE — Progress Notes (Addendum)
    SUBJECTIVE:   CHIEF COMPLAINT / HPI:   About 6 days ago on Sunday patient was out shopping and started to experience some dizziness and racing thoughts with palpitations Reports she has a history of anxiety and was starting a new job on Monday. Her symptoms improved after doing some deep breathing exercises in her car Had an appointment yesterday with her counselor which helped  Currently feels her symptoms have improved a bit but not gone away completely Does endorse some mild central nonradiating chest pain, no N/V/abd pain, no diaphoresis She is interested in having an as needed medicine for anxiety rather than a daily medication She wants to continue therapy Mainly wants to make sure it is nothing else causing her symptoms today   PERTINENT  PMH / PSH: History of MDD, anxiety, panic attacks. No immediate fhx early heart disease  OBJECTIVE:   BP 106/70   Pulse 69   Ht 5' 3 (1.6 m)   Wt 218 lb 12.8 oz (99.2 kg)   LMP 06/05/2024 (Exact Date)   SpO2 100%   BMI 38.76 kg/m    General: NAD, pleasant, able to participate in exam Cardiac: RRR, no murmurs auscultated Respiratory: CTAB, normal WOB Abdomen: soft, non-tender, non-distended, normoactive bowel sounds Extremities: warm and well perfused, no edema or cyanosis Skin: warm and dry, no rashes noted Neuro: alert, no obvious focal deficits, speech normal      06/27/2024    9:37 AM 11/28/2017    9:37 AM  GAD 7 : Generalized Anxiety Score  Nervous, Anxious, on Edge 2 2  Control/stop worrying 1 2  Worry too much - different things 0 2  Trouble relaxing 0 2  Restless 1 0  Easily annoyed or irritable 3 2  Afraid - awful might happen 1 0  Total GAD 7 Score 8 10  Anxiety Difficulty Somewhat difficult Somewhat difficult      ASSESSMENT/PLAN:   Assessment & Plan Anxiety Dizziness Palpitations Chest pain, unspecified type Suspect secondary to anxiety GAD-7 consistent with mild anxiety Orthostatic vital signs  are negative EKG unremarkable, no evidence of acute arrhythmia or ischemia-initial EKG was actually abnormal and showed T wave inversions in anterolateral leads.  However, this was presumed to be lead inversion and upon repeat her EKG was normal (placed picture of repeat ecg in media tab) Will check CBC, BMP, TSH to evaluate for anemia, electrolyte abnormalities, or thyroid  abnormalities  Per patient preference will start Atarax  as needed for anxiety/panic attack episodes Follow-up in 1 to 2 months as needed especially if symptoms are not improving Discussed return precautions   Payton Coward, MD [facility]

## 2024-06-28 LAB — BASIC METABOLIC PANEL WITH GFR
BUN/Creatinine Ratio: 11 (ref 9–23)
BUN: 14 mg/dL (ref 6–20)
CO2: 19 mmol/L — ABNORMAL LOW (ref 20–29)
Calcium: 9.3 mg/dL (ref 8.7–10.2)
Chloride: 102 mmol/L (ref 96–106)
Creatinine, Ser: 1.32 mg/dL — ABNORMAL HIGH (ref 0.57–1.00)
Glucose: 71 mg/dL (ref 70–99)
Potassium: 4.7 mmol/L (ref 3.5–5.2)
Sodium: 136 mmol/L (ref 134–144)
eGFR: 56 mL/min/1.73 — ABNORMAL LOW (ref >59)

## 2024-06-28 LAB — TSH RFX ON ABNORMAL TO FREE T4: TSH: 2.71 u[IU]/mL (ref 0.450–4.500)

## 2024-06-28 LAB — CBC WITH DIFFERENTIAL/PLATELET
Basophils Absolute: 0.1 x10E3/uL (ref 0.0–0.2)
Basos: 1 %
EOS (ABSOLUTE): 0.1 x10E3/uL (ref 0.0–0.4)
Eos: 1 %
Hematocrit: 43.3 % (ref 34.0–46.6)
Hemoglobin: 14.2 g/dL (ref 11.1–15.9)
Immature Grans (Abs): 0 x10E3/uL (ref 0.0–0.1)
Immature Granulocytes: 0 %
Lymphocytes Absolute: 2.1 x10E3/uL (ref 0.7–3.1)
Lymphs: 44 %
MCH: 31.8 pg (ref 26.6–33.0)
MCHC: 32.8 g/dL (ref 31.5–35.7)
MCV: 97 fL (ref 79–97)
Monocytes Absolute: 0.4 x10E3/uL (ref 0.1–0.9)
Monocytes: 8 %
Neutrophils Absolute: 2.3 x10E3/uL (ref 1.4–7.0)
Neutrophils: 46 %
Platelets: 284 x10E3/uL (ref 150–450)
RBC: 4.46 x10E6/uL (ref 3.77–5.28)
RDW: 11.8 % (ref 11.7–15.4)
WBC: 4.9 x10E3/uL (ref 3.4–10.8)

## 2024-07-01 ENCOUNTER — Ambulatory Visit (HOSPITAL_BASED_OUTPATIENT_CLINIC_OR_DEPARTMENT_OTHER): Payer: Self-pay | Admitting: Family Medicine

## 2024-07-01 DIAGNOSIS — Z419 Encounter for procedure for purposes other than remedying health state, unspecified: Secondary | ICD-10-CM | POA: Diagnosis not present

## 2024-07-02 DIAGNOSIS — F331 Major depressive disorder, recurrent, moderate: Secondary | ICD-10-CM | POA: Diagnosis not present

## 2024-07-02 DIAGNOSIS — F411 Generalized anxiety disorder: Secondary | ICD-10-CM | POA: Diagnosis not present

## 2024-07-10 ENCOUNTER — Ambulatory Visit: Admitting: Obstetrics and Gynecology

## 2024-07-10 VITALS — BP 120/72 | HR 108 | Wt 217.4 lb

## 2024-07-10 DIAGNOSIS — N898 Other specified noninflammatory disorders of vagina: Secondary | ICD-10-CM

## 2024-07-10 DIAGNOSIS — Z113 Encounter for screening for infections with a predominantly sexual mode of transmission: Secondary | ICD-10-CM | POA: Diagnosis not present

## 2024-07-10 NOTE — Progress Notes (Unsigned)
   Acute Office Visit  Subjective:    Patient ID: Christy Barnes, female    DOB: Sep 03, 1996, 28 y.o.   MRN: [id number]   HPI 28 y.o. presents today for Gynecologic Exam (TOC and would like a full STD panel. Wanted to know if we do the mouth swabs for std/sti) . Treated for M. Urea for recurrent BV and finished treatment and is here today for the test of cure.  Patient's last menstrual period was 07/04/2024 (exact date).    Review of Systems     Objective:    OBGyn Exam  BP 120/72   Pulse (!) 108   Wt 217 lb 6.4 oz (98.6 kg)   LMP 07/04/2024 (Exact Date)   SpO2 93%   BMI 38.51 kg/m  Wt Readings from Last 3 Encounters:  07/10/24 217 lb 6.4 oz (98.6 kg)  06/27/24 218 lb 12.8 oz (99.2 kg)  05/16/24 213 lb (96.6 kg)        Erika, CMA was present for the exam SVE: no lesions. Normal discharge   Assessment & Plan:  Nuswab and mycoplasma Urea sent STI labs and blood work ordered. Counseled on putting HSV antibody on there as well, and she agreed.  Dr. Glennon Norris Christy Barnes Glennon

## 2024-07-11 ENCOUNTER — Ambulatory Visit: Payer: Self-pay | Admitting: Obstetrics and Gynecology

## 2024-07-11 DIAGNOSIS — F331 Major depressive disorder, recurrent, moderate: Secondary | ICD-10-CM | POA: Diagnosis not present

## 2024-07-11 DIAGNOSIS — F411 Generalized anxiety disorder: Secondary | ICD-10-CM | POA: Diagnosis not present

## 2024-07-11 LAB — SURESWAB® ADVANCED VAGINITIS PLUS,TMA
C. trachomatis RNA, TMA: NOT DETECTED
CANDIDA SPECIES: NOT DETECTED
Candida glabrata: NOT DETECTED
N. gonorrhoeae RNA, TMA: NOT DETECTED
SURESWAB(R) ADV BACTERIAL VAGINOSIS(BV),TMA: NEGATIVE
TRICHOMONAS VAGINALIS (TV),TMA: NOT DETECTED

## 2024-07-12 LAB — HSV(HERPES SIMPLEX VRS) I + II AB-IGG
HSV 1 IGG,TYPE SPECIFIC AB: <0.9 {index}
HSV 2 IGG,TYPE SPECIFIC AB: <0.9 {index}

## 2024-07-12 LAB — HIV ANTIBODY (ROUTINE TESTING W REFLEX): HIV 1&2 Ab, 4th Generation: NONREACTIVE

## 2024-07-12 LAB — HEPATITIS C ANTIBODY: Hepatitis C Ab: NONREACTIVE

## 2024-07-12 LAB — HEPATITIS B SURFACE ANTIGEN: Hepatitis B Surface Ag: NONREACTIVE

## 2024-07-12 LAB — RPR: RPR Ser Ql: NONREACTIVE

## 2024-07-13 LAB — SURESWAB MYCOPLASMA/UREAPLASMA, PCR
M. hominis DNA: NOT DETECTED
MYCOPLASMA GENITALIUM, rRNA,TMA: NOT DETECTED
U. parvum DNA: NOT DETECTED
U. urealyticum DNA: NOT DETECTED

## 2024-07-16 DIAGNOSIS — F331 Major depressive disorder, recurrent, moderate: Secondary | ICD-10-CM | POA: Diagnosis not present

## 2024-07-16 DIAGNOSIS — F411 Generalized anxiety disorder: Secondary | ICD-10-CM | POA: Diagnosis not present

## 2024-07-24 DIAGNOSIS — F331 Major depressive disorder, recurrent, moderate: Secondary | ICD-10-CM | POA: Diagnosis not present

## 2024-07-24 DIAGNOSIS — F411 Generalized anxiety disorder: Secondary | ICD-10-CM | POA: Diagnosis not present

## 2024-07-31 DIAGNOSIS — F411 Generalized anxiety disorder: Secondary | ICD-10-CM | POA: Diagnosis not present

## 2024-07-31 DIAGNOSIS — F331 Major depressive disorder, recurrent, moderate: Secondary | ICD-10-CM | POA: Diagnosis not present

## 2024-10-14 ENCOUNTER — Ambulatory Visit: Admitting: Student

## 2024-10-15 ENCOUNTER — Other Ambulatory Visit (HOSPITAL_COMMUNITY)
Admission: RE | Admit: 2024-10-15 | Discharge: 2024-10-15 | Disposition: A | Discharge status: Home / Self Care | Payer: Self-pay | Source: Ambulatory Visit | Attending: Family Medicine | Admitting: Family Medicine

## 2024-10-15 ENCOUNTER — Encounter: Payer: Self-pay | Admitting: Family Medicine

## 2024-10-15 ENCOUNTER — Ambulatory Visit: Admitting: Family Medicine

## 2024-10-15 VITALS — BP 109/70 | HR 77 | Ht 63.0 in | Wt 230.0 lb

## 2024-10-15 DIAGNOSIS — Z23 Encounter for immunization: Secondary | ICD-10-CM

## 2024-10-15 DIAGNOSIS — Z113 Encounter for screening for infections with a predominantly sexual mode of transmission: Secondary | ICD-10-CM

## 2024-10-15 DIAGNOSIS — R5383 Other fatigue: Secondary | ICD-10-CM

## 2024-10-15 DIAGNOSIS — Z2981 Encounter for HIV pre-exposure prophylaxis: Secondary | ICD-10-CM

## 2024-10-15 MED ORDER — EMTRICITABINE-TENOFOVIR DF 200-300 MG PO TABS: 1.0000 | ORAL_TABLET | Freq: Every day | ORAL | 0 refills | Status: AC

## 2024-10-15 NOTE — Progress Notes (Signed)
   SUBJECTIVE:   CHIEF COMPLAINT / HPI:  Discussed the use of AI scribe software for clinical note transcription with the patient, who gave verbal consent to proceed.  History of Present Illness Christy Barnes is a 28 year old female who presents with fatigue and for STI screening due to a new partner.  Sexual health and sti screening - Seeks STI screening due to a new sexual partner - Occasional condom use - Not using medication-based birth control due to prior side effects with Nexplanon  and oral contraceptive pills - No current use of PrEP though was previously prescribed.   Fatigue and sleep disturbance - Persistent fatigue despite normal laboratory results, including thyroid  and anemia screenings - Sleep duration varies from 5 to 7 hours nightly, with attempts to achieve 8 hours twice weekly - Work schedule requires early rising, resulting in sleepiness by 10:00 AM - Falls asleep easily but does not fall asleep during activities such as driving or talking - No snoring, observed apnea, or morning headaches - Occasional headaches present - No significant night sweats or unintentional weight loss - Has increased appetite.  - Reduced physical activity     PERTINENT  PMH / PSH: PCOS, Migraines,   OBJECTIVE:  BP 109/70   Pulse 77   Ht 5' 3 (1.6 m)   Wt 230 lb (104.3 kg)   LMP 09/28/2024   SpO2 99%   BMI 40.74 kg/m   General: well appearing, in no acute distress Resp: Normal work of breathing on room air Neuro: Alert & Oriented  GU (chaperoned by CMA Dayshia)   ASSESSMENT/PLAN:   Assessment & Plan Routine screening for STI (sexually transmitted infection) Screening requested due to a new sexual partner. No symptoms reported. - Performed oral and vaginal swabs for STI screening. (GC/CT, trich), serum tests for HIV and RPR  - Counseled on safe sex practices and consistent condom use.  - Patient declined birth control despite benefit and risk discussions, recommended  prenatal vitamins  Encounter for HIV pre-exposure prophylaxis Refilled Truvada per patient request.  Other fatigue Chronic fatigue likely due to sleep deprivation. Previous labs normal. Low risk for sleep apnea based on screening. Increased sugar cravings likely due to sleep deprivation. No B symptoms otherwise.  - Encouraged improving sleep hygiene and increasing sleep duration. - Discussed potential benefits of exercise and maintaining a regular sleep schedule.    Areta Saliva, MD [facility] [place]

## 2024-10-15 NOTE — Assessment & Plan Note (Signed)
 Chronic fatigue likely due to sleep deprivation. Previous labs normal. Low risk for sleep apnea based on screening. Increased sugar cravings likely due to sleep deprivation. No B symptoms otherwise.  - Encouraged improving sleep hygiene and increasing sleep duration. - Discussed potential benefits of exercise and maintaining a regular sleep schedule.

## 2024-10-15 NOTE — Patient Instructions (Addendum)
 It was wonderful to see you today.  Please bring ALL of your medications with you to every visit.   Today we talked about:  STI screening - I will let you know your results via mychart I recommend consistent use of condoms always to prevent unwanted pregnancy and STIs. Take prenatal vitamins if not interested in this. I have refilled your Truvada.   Fatigue - I really believe this is due to sleep deprivation. Your previous labs were all normal and your symptoms seem in line with this. I recommend trying to get at least 8 hours of sleep as much as you can.  Please follow up in  3 months regarding fatigue   Thank you for choosing [facility].   Please call [phone] with any questions about today's appointment.  Please be sure to schedule follow up at the front desk before you leave today.   Areta Saliva, MD  Family Medicine

## 2024-10-15 NOTE — Assessment & Plan Note (Signed)
 Screening requested due to a new sexual partner. No symptoms reported. - Performed oral and vaginal swabs for STI screening. (GC/CT, trich), serum tests for HIV and RPR  - Counseled on safe sex practices and consistent condom use.  - Patient declined birth control despite benefit and risk discussions, recommended prenatal vitamins

## 2024-10-16 LAB — SYPHILIS: RPR W/REFLEX TO RPR TITER AND TREPONEMAL ANTIBODIES, TRADITIONAL SCREENING AND DIAGNOSIS ALGORITHM: RPR Ser Ql: NONREACTIVE

## 2024-10-16 LAB — HIV ANTIBODY (ROUTINE TESTING W REFLEX): HIV Screen 4th Generation wRfx: NONREACTIVE

## 2024-10-17 LAB — CERVICOVAGINAL ANCILLARY ONLY
Chlamydia: NEGATIVE
Chlamydia: NEGATIVE
Comment: NEGATIVE
Comment: NORMAL
Comment: NEGATIVE
Comment: NEGATIVE
Comment: NORMAL
Neisseria Gonorrhea: NEGATIVE
Neisseria Gonorrhea: NEGATIVE
Trichomonas: NEGATIVE

## 2024-10-23 ENCOUNTER — Ambulatory Visit: Payer: Self-pay | Admitting: Family Medicine
